# Patient Record
Sex: Female | Born: 1960 | Race: White | Hispanic: No | Marital: Married | Smoking: Former smoker
Health system: Southern US, Community
[De-identification: ages and names within clinical notes are randomized; demographics above are authoritative.]

## PROBLEM LIST (undated history)

## (undated) DIAGNOSIS — K7460 Unspecified cirrhosis of liver: Secondary | ICD-10-CM

## (undated) DIAGNOSIS — Z923 Personal history of irradiation: Secondary | ICD-10-CM

## (undated) DIAGNOSIS — N390 Urinary tract infection, site not specified: Secondary | ICD-10-CM

## (undated) DIAGNOSIS — F419 Anxiety disorder, unspecified: Secondary | ICD-10-CM

## (undated) DIAGNOSIS — K219 Gastro-esophageal reflux disease without esophagitis: Secondary | ICD-10-CM

## (undated) DIAGNOSIS — M1611 Unilateral primary osteoarthritis, right hip: Secondary | ICD-10-CM

## (undated) DIAGNOSIS — F329 Major depressive disorder, single episode, unspecified: Secondary | ICD-10-CM

## (undated) DIAGNOSIS — F1020 Alcohol dependence, uncomplicated: Secondary | ICD-10-CM

## (undated) DIAGNOSIS — F17210 Nicotine dependence, cigarettes, uncomplicated: Secondary | ICD-10-CM

## (undated) DIAGNOSIS — M545 Low back pain, unspecified: Secondary | ICD-10-CM

## (undated) DIAGNOSIS — D649 Anemia, unspecified: Secondary | ICD-10-CM

## (undated) DIAGNOSIS — Z720 Tobacco use: Secondary | ICD-10-CM

## (undated) DIAGNOSIS — IMO0001 Reserved for inherently not codable concepts without codable children: Secondary | ICD-10-CM

## (undated) DIAGNOSIS — G9511 Acute infarction of spinal cord (embolic) (nonembolic): Secondary | ICD-10-CM

## (undated) DIAGNOSIS — M1990 Unspecified osteoarthritis, unspecified site: Secondary | ICD-10-CM

## (undated) DIAGNOSIS — J449 Chronic obstructive pulmonary disease, unspecified: Secondary | ICD-10-CM

## (undated) DIAGNOSIS — F32A Depression, unspecified: Secondary | ICD-10-CM

## (undated) DIAGNOSIS — R011 Cardiac murmur, unspecified: Secondary | ICD-10-CM

## (undated) DIAGNOSIS — I10 Essential (primary) hypertension: Secondary | ICD-10-CM

## (undated) HISTORY — PX: TUBAL LIGATION: SHX77

## (undated) HISTORY — PX: DILATION AND CURETTAGE OF UTERUS: SHX78

## (undated) HISTORY — DX: Unilateral primary osteoarthritis, right hip: M16.11

## (undated) HISTORY — PX: ESOPHAGOGASTRODUODENOSCOPY ENDOSCOPY: SHX5814

## (undated) HISTORY — DX: Tobacco use: Z72.0

## (undated) HISTORY — PX: COLONOSCOPY: SHX174

## (undated) HISTORY — DX: Unspecified cirrhosis of liver: K74.60

## (undated) HISTORY — DX: Acute infarction of spinal cord (embolic) (nonembolic): G95.11

---

## 2005-08-01 ENCOUNTER — Emergency Department: Payer: Self-pay | Admitting: Emergency Medicine

## 2007-11-03 ENCOUNTER — Emergency Department: Payer: Self-pay | Admitting: Emergency Medicine

## 2007-12-01 ENCOUNTER — Ambulatory Visit: Payer: Self-pay

## 2008-11-28 ENCOUNTER — Ambulatory Visit: Payer: Self-pay

## 2008-12-06 ENCOUNTER — Inpatient Hospital Stay: Payer: Self-pay | Admitting: Family

## 2008-12-06 ENCOUNTER — Ambulatory Visit: Payer: Self-pay | Admitting: Family

## 2008-12-19 ENCOUNTER — Ambulatory Visit: Payer: Self-pay | Admitting: Family

## 2008-12-22 ENCOUNTER — Other Ambulatory Visit: Payer: Self-pay | Admitting: Family

## 2009-05-04 ENCOUNTER — Inpatient Hospital Stay: Payer: Self-pay | Admitting: Internal Medicine

## 2009-07-30 ENCOUNTER — Ambulatory Visit: Payer: Self-pay | Admitting: Gastroenterology

## 2010-01-11 ENCOUNTER — Ambulatory Visit: Payer: Self-pay | Admitting: Internal Medicine

## 2010-01-29 ENCOUNTER — Inpatient Hospital Stay: Payer: Self-pay | Admitting: Internal Medicine

## 2010-02-11 ENCOUNTER — Ambulatory Visit: Payer: Self-pay | Admitting: Internal Medicine

## 2010-02-11 ENCOUNTER — Emergency Department: Payer: Self-pay | Admitting: Emergency Medicine

## 2010-02-14 ENCOUNTER — Ambulatory Visit: Payer: Self-pay | Admitting: Gastroenterology

## 2010-02-20 ENCOUNTER — Ambulatory Visit: Payer: Self-pay | Admitting: Family Medicine

## 2010-06-20 ENCOUNTER — Ambulatory Visit: Payer: Self-pay | Admitting: Gastroenterology

## 2011-04-09 ENCOUNTER — Ambulatory Visit: Payer: Self-pay | Admitting: Gastroenterology

## 2011-06-25 ENCOUNTER — Ambulatory Visit: Payer: Self-pay | Admitting: Family Medicine

## 2011-11-05 ENCOUNTER — Other Ambulatory Visit: Payer: Self-pay | Admitting: Family

## 2012-03-24 ENCOUNTER — Ambulatory Visit: Payer: Self-pay | Admitting: Gastroenterology

## 2012-08-17 ENCOUNTER — Ambulatory Visit: Payer: Self-pay | Admitting: Family Medicine

## 2013-01-03 ENCOUNTER — Ambulatory Visit: Payer: Self-pay | Admitting: Family Medicine

## 2013-08-29 ENCOUNTER — Ambulatory Visit: Payer: Self-pay | Admitting: Gastroenterology

## 2013-11-07 ENCOUNTER — Ambulatory Visit: Payer: Self-pay | Admitting: Family Medicine

## 2013-11-10 ENCOUNTER — Ambulatory Visit: Payer: Self-pay | Admitting: Family Medicine

## 2014-05-17 ENCOUNTER — Emergency Department: Payer: Self-pay | Admitting: Emergency Medicine

## 2014-05-17 LAB — BASIC METABOLIC PANEL
Anion Gap: 6 — ABNORMAL LOW (ref 7–16)
BUN: 15 mg/dL (ref 7–18)
CALCIUM: 8.6 mg/dL (ref 8.5–10.1)
Chloride: 104 mmol/L (ref 98–107)
Co2: 27 mmol/L (ref 21–32)
Creatinine: 0.89 mg/dL (ref 0.60–1.30)
EGFR (Non-African Amer.): >60
Glucose: 111 mg/dL — ABNORMAL HIGH (ref 65–99)
Osmolality: 275 (ref 275–301)
Potassium: 4 mmol/L (ref 3.5–5.1)
SODIUM: 137 mmol/L (ref 136–145)

## 2014-05-17 LAB — CBC
HCT: 44.3 % (ref 35.0–47.0)
HGB: 15 g/dL (ref 12.0–16.0)
MCH: 31.4 pg (ref 26.0–34.0)
MCHC: 33.8 g/dL (ref 32.0–36.0)
MCV: 93 fL (ref 80–100)
Platelet: 130 10*3/uL — ABNORMAL LOW (ref 150–440)
RBC: 4.77 10*6/uL (ref 3.80–5.20)
RDW: 12.8 % (ref 11.5–14.5)
WBC: 9.5 10*3/uL (ref 3.6–11.0)

## 2014-05-17 LAB — TROPONIN I: Troponin-I: <0.02 ng/mL

## 2014-05-19 ENCOUNTER — Telehealth: Payer: Self-pay

## 2014-05-19 NOTE — Telephone Encounter (Signed)
ATTEMPTED TO CALL PT TO R/S, NO ANSWER

## 2014-05-22 ENCOUNTER — Ambulatory Visit: Payer: Self-pay | Admitting: Cardiovascular Disease

## 2014-06-22 ENCOUNTER — Ambulatory Visit: Payer: Self-pay | Admitting: Cardiovascular Disease

## 2014-10-05 ENCOUNTER — Ambulatory Visit: Payer: Self-pay | Admitting: Gastroenterology

## 2014-11-15 ENCOUNTER — Other Ambulatory Visit: Payer: Self-pay

## 2014-11-15 DIAGNOSIS — Z1231 Encounter for screening mammogram for malignant neoplasm of breast: Secondary | ICD-10-CM

## 2014-12-06 ENCOUNTER — Ambulatory Visit: Payer: Self-pay

## 2014-12-15 ENCOUNTER — Ambulatory Visit
Admission: RE | Admit: 2014-12-15 | Discharge: 2014-12-15 | Disposition: A | Discharge status: Home / Self Care | Payer: Medicare Other | Source: Ambulatory Visit | Attending: Family Medicine | Admitting: Family Medicine

## 2014-12-15 DIAGNOSIS — Z1231 Encounter for screening mammogram for malignant neoplasm of breast: Secondary | ICD-10-CM | POA: Diagnosis not present

## 2015-09-04 ENCOUNTER — Other Ambulatory Visit: Payer: Self-pay | Admitting: Nurse Practitioner

## 2015-09-04 DIAGNOSIS — K7460 Unspecified cirrhosis of liver: Secondary | ICD-10-CM

## 2015-09-06 ENCOUNTER — Ambulatory Visit
Admission: RE | Admit: 2015-09-06 | Discharge: 2015-09-06 | Disposition: A | Discharge status: Home / Self Care | Payer: Medicare Other | Source: Ambulatory Visit | Attending: Nurse Practitioner | Admitting: Nurse Practitioner

## 2015-09-06 DIAGNOSIS — K7460 Unspecified cirrhosis of liver: Secondary | ICD-10-CM | POA: Insufficient documentation

## 2015-09-06 DIAGNOSIS — K8021 Calculus of gallbladder without cholecystitis with obstruction: Secondary | ICD-10-CM | POA: Diagnosis not present

## 2015-10-09 ENCOUNTER — Encounter
Admission: RE | Admit: 2015-10-09 | Discharge: 2015-10-09 | Disposition: A | Discharge status: Home / Self Care | Payer: Medicare Other | Source: Ambulatory Visit | Attending: Orthopedic Surgery | Admitting: Orthopedic Surgery

## 2015-10-09 DIAGNOSIS — I10 Essential (primary) hypertension: Secondary | ICD-10-CM | POA: Diagnosis not present

## 2015-10-09 DIAGNOSIS — M1611 Unilateral primary osteoarthritis, right hip: Secondary | ICD-10-CM | POA: Diagnosis not present

## 2015-10-09 DIAGNOSIS — Z01812 Encounter for preprocedural laboratory examination: Secondary | ICD-10-CM | POA: Insufficient documentation

## 2015-10-09 DIAGNOSIS — Z01810 Encounter for preprocedural cardiovascular examination: Secondary | ICD-10-CM | POA: Insufficient documentation

## 2015-10-09 HISTORY — DX: Unspecified cirrhosis of liver: K74.60

## 2015-10-09 HISTORY — DX: Chronic obstructive pulmonary disease, unspecified: J44.9

## 2015-10-09 HISTORY — DX: Reserved for inherently not codable concepts without codable children: IMO0001

## 2015-10-09 HISTORY — DX: Gastro-esophageal reflux disease without esophagitis: K21.9

## 2015-10-09 HISTORY — DX: Low back pain, unspecified: M54.50

## 2015-10-09 HISTORY — DX: Essential (primary) hypertension: I10

## 2015-10-09 HISTORY — DX: Low back pain: M54.5

## 2015-10-09 HISTORY — DX: Nicotine dependence, cigarettes, uncomplicated: F17.210

## 2015-10-09 HISTORY — DX: Depression, unspecified: F32.A

## 2015-10-09 HISTORY — DX: Unspecified osteoarthritis, unspecified site: M19.90

## 2015-10-09 HISTORY — DX: Major depressive disorder, single episode, unspecified: F32.9

## 2015-10-09 LAB — BASIC METABOLIC PANEL
Anion gap: 8 (ref 5–15)
BUN: 26 mg/dL — AB (ref 6–20)
CALCIUM: 9.4 mg/dL (ref 8.9–10.3)
CHLORIDE: 103 mmol/L (ref 101–111)
CO2: 23 mmol/L (ref 22–32)
CREATININE: 0.75 mg/dL (ref 0.44–1.00)
GFR calc Af Amer: >60 mL/min (ref >60)
Glucose, Bld: 92 mg/dL (ref 65–99)
Potassium: 4.7 mmol/L (ref 3.5–5.1)
SODIUM: 134 mmol/L — AB (ref 135–145)

## 2015-10-09 LAB — URINALYSIS COMPLETE WITH MICROSCOPIC (ARMC ONLY)
Bilirubin Urine: NEGATIVE
Glucose, UA: NEGATIVE mg/dL
KETONES UR: NEGATIVE mg/dL
NITRITE: POSITIVE — AB
Protein, ur: NEGATIVE mg/dL
SPECIFIC GRAVITY, URINE: 1.011 (ref 1.005–1.030)
pH: 6 (ref 5.0–8.0)

## 2015-10-09 LAB — SEDIMENTATION RATE: SED RATE: 21 mm/h (ref 0–30)

## 2015-10-09 LAB — PROTIME-INR
INR: 0.95
PROTHROMBIN TIME: 12.9 s (ref 11.4–15.0)

## 2015-10-09 LAB — APTT: aPTT: 32 seconds (ref 24–36)

## 2015-10-09 LAB — TYPE AND SCREEN
ABO/RH(D): O NEG
ANTIBODY SCREEN: NEGATIVE

## 2015-10-09 LAB — CBC
HCT: 41.3 % (ref 35.0–47.0)
Hemoglobin: 13.9 g/dL (ref 12.0–16.0)
MCH: 31.1 pg (ref 26.0–34.0)
MCHC: 33.8 g/dL (ref 32.0–36.0)
MCV: 91.9 fL (ref 80.0–100.0)
PLATELETS: 126 10*3/uL — AB (ref 150–440)
RBC: 4.49 MIL/uL (ref 3.80–5.20)
RDW: 12.6 % (ref 11.5–14.5)
WBC: 7.2 10*3/uL (ref 3.6–11.0)

## 2015-10-09 LAB — SURGICAL PCR SCREEN
MRSA, PCR: NEGATIVE
STAPHYLOCOCCUS AUREUS: NEGATIVE

## 2015-10-09 LAB — ABO/RH: ABO/RH(D): O NEG

## 2015-10-09 NOTE — Patient Instructions (Signed)
  Your procedure is scheduled on: October 16, 2015 (Tuesday) Report to Day Surgery.[facility]) Second Floor To find out your arrival time please call [phone] between 1PM - 3PM on October 15, 2015 (Monday).  Remember: Instructions that are not followed completely may result in serious medical risk, up to and including death, or upon the discretion of your surgeon and anesthesiologist your surgery may need to be rescheduled.    __x__ 1. Do not eat food or drink liquids after midnight. No gum chewing or hard candies.     ____ 2. No Alcohol for 24 hours before or after surgery.   ____ 3. Bring all medications with you on the day of surgery if instructed.    __x_ 4. Notify your doctor if there is any change in your medical condition     (cold, fever, infections).     Do not wear jewelry, make-up, hairpins, clips or nail polish.  Do not wear lotions, powders, or perfumes. You may wear deodorant.  Do not shave 48 hours prior to surgery. Men may shave face and neck.  Do not bring valuables to the hospital.    [facility] is not responsible for any belongings or valuables.               Contacts, dentures or bridgework may not be worn into surgery.  Leave your suitcase in the car. After surgery it may be brought to your room.  For patients admitted to the hospital, discharge time is determined by your                treatment team.   Patients discharged the day of surgery will not be allowed to drive home.   Please read over the following fact sheets that you were given:   MRSA Information and Surgical Site Infection Prevention   __x__ Take these medicines the morning of surgery with A SIP OF WATER:    1. Nadolol  2. Pantoprazole (Pantoprazole at bedtime on April 3)  3.   4.  5.  6.  ____ Fleet Enema (as directed)   _x__ Use CHG Soap as directed  _x___ Use inhalers on the day of surgery (Use Advair, Albuterol and Spiriva inhalers the morning of surgery , and bring Advair and  Albuterol inhalers with you to hospital)  ____ Stop metformin 2 days prior to surgery    ____ Take 1/2 of usual insulin dose the night before surgery and none on the morning of surgery.   _x___ Stop Coumadin/Plavix/aspirin on (N/A)  _x___ Stop Anti-inflammatories on (NO NSAIDS) (Stop Meloxicam now)   __x__ Stop supplements until after surgery.  (Stop Vitamin B complex now)  ____ Bring C-Pap to the hospital.

## 2015-10-10 NOTE — Pre-Procedure Instructions (Signed)
Abnormal UA results sent to Dr Menz.  

## 2015-10-11 LAB — URINE CULTURE: Culture: >=100000

## 2015-10-11 NOTE — Pre-Procedure Instructions (Signed)
Dr. Rosita KeaMenz office notified of urine culture with E coli sensitive to Ancef.  Surgery on 4/4 for total hip.  Does he want to treat before the surgery day.  Hope said she would check and call us back

## 2015-10-13 HISTORY — PX: JOINT REPLACEMENT: SHX530

## 2015-10-16 ENCOUNTER — Encounter
Admission: RE | Disposition: A | Discharge status: Home Health | Payer: Self-pay | Source: Ambulatory Visit | Attending: Orthopedic Surgery

## 2015-10-16 ENCOUNTER — Inpatient Hospital Stay: Payer: Medicare Other | Admitting: Anesthesiology

## 2015-10-16 ENCOUNTER — Inpatient Hospital Stay: Payer: Medicare Other

## 2015-10-16 ENCOUNTER — Inpatient Hospital Stay
Admission: RE | Admit: 2015-10-16 | Discharge: 2015-10-19 | DRG: 470 | Disposition: A | Discharge status: Home Health | Payer: Medicare Other | Source: Ambulatory Visit | Attending: Orthopedic Surgery | Admitting: Orthopedic Surgery

## 2015-10-16 ENCOUNTER — Encounter: Payer: Self-pay | Admitting: *Deleted

## 2015-10-16 DIAGNOSIS — Z885 Allergy status to narcotic agent status: Secondary | ICD-10-CM | POA: Diagnosis not present

## 2015-10-16 DIAGNOSIS — I10 Essential (primary) hypertension: Secondary | ICD-10-CM | POA: Diagnosis present

## 2015-10-16 DIAGNOSIS — G8918 Other acute postprocedural pain: Secondary | ICD-10-CM

## 2015-10-16 DIAGNOSIS — F329 Major depressive disorder, single episode, unspecified: Secondary | ICD-10-CM | POA: Diagnosis present

## 2015-10-16 DIAGNOSIS — K7030 Alcoholic cirrhosis of liver without ascites: Secondary | ICD-10-CM | POA: Diagnosis present

## 2015-10-16 DIAGNOSIS — Z7951 Long term (current) use of inhaled steroids: Secondary | ICD-10-CM | POA: Diagnosis not present

## 2015-10-16 DIAGNOSIS — J449 Chronic obstructive pulmonary disease, unspecified: Secondary | ICD-10-CM | POA: Diagnosis present

## 2015-10-16 DIAGNOSIS — Z888 Allergy status to other drugs, medicaments and biological substances status: Secondary | ICD-10-CM | POA: Diagnosis not present

## 2015-10-16 DIAGNOSIS — D62 Acute posthemorrhagic anemia: Secondary | ICD-10-CM | POA: Diagnosis not present

## 2015-10-16 DIAGNOSIS — Z79899 Other long term (current) drug therapy: Secondary | ICD-10-CM | POA: Diagnosis not present

## 2015-10-16 DIAGNOSIS — Z419 Encounter for procedure for purposes other than remedying health state, unspecified: Secondary | ICD-10-CM

## 2015-10-16 DIAGNOSIS — M1611 Unilateral primary osteoarthritis, right hip: Secondary | ICD-10-CM | POA: Diagnosis present

## 2015-10-16 DIAGNOSIS — K219 Gastro-esophageal reflux disease without esophagitis: Secondary | ICD-10-CM | POA: Diagnosis present

## 2015-10-16 DIAGNOSIS — F17210 Nicotine dependence, cigarettes, uncomplicated: Secondary | ICD-10-CM | POA: Diagnosis present

## 2015-10-16 HISTORY — PX: TOTAL HIP ARTHROPLASTY: SHX124

## 2015-10-16 HISTORY — DX: Unilateral primary osteoarthritis, right hip: M16.11

## 2015-10-16 LAB — CBC
HEMATOCRIT: 34.8 % — AB (ref 35.0–47.0)
Hemoglobin: 11.8 g/dL — ABNORMAL LOW (ref 12.0–16.0)
MCH: 31.2 pg (ref 26.0–34.0)
MCHC: 33.7 g/dL (ref 32.0–36.0)
MCV: 92.5 fL (ref 80.0–100.0)
PLATELETS: 109 10*3/uL — AB (ref 150–440)
RBC: 3.77 MIL/uL — ABNORMAL LOW (ref 3.80–5.20)
RDW: 12.4 % (ref 11.5–14.5)
WBC: 12.5 10*3/uL — AB (ref 3.6–11.0)

## 2015-10-16 LAB — CREATININE, SERUM
Creatinine, Ser: 0.7 mg/dL (ref 0.44–1.00)
GFR calc Af Amer: >60 mL/min (ref >60)

## 2015-10-16 SURGERY — ARTHROPLASTY, HIP, TOTAL, ANTERIOR APPROACH
Anesthesia: General | Site: Hip | Laterality: Right | Wound class: Clean

## 2015-10-16 MED ORDER — METOCLOPRAMIDE HCL 5 MG/ML IJ SOLN: 5.0000 mg | Freq: Three times a day (TID) | INTRAMUSCULAR | Status: DC | PRN

## 2015-10-16 MED ORDER — METHOCARBAMOL 1000 MG/10ML IJ SOLN
500.0000 mg | Freq: Four times a day (QID) | INTRAVENOUS | Status: DC | PRN
  Filled 2015-10-16: qty 5

## 2015-10-16 MED ORDER — PROPOFOL 10 MG/ML IV BOLUS
INTRAVENOUS | Status: DC | PRN
  Administered 2015-10-16: 50 mg via INTRAVENOUS

## 2015-10-16 MED ORDER — ENOXAPARIN SODIUM 40 MG/0.4ML ~~LOC~~ SOLN
40.0000 mg | SUBCUTANEOUS | Status: DC
  Administered 2015-10-17 – 2015-10-19 (×3): 40 mg via SUBCUTANEOUS
  Filled 2015-10-16 (×4): qty 0.4

## 2015-10-16 MED ORDER — CEFAZOLIN SODIUM-DEXTROSE 2-4 GM/100ML-% IV SOLN
2.0000 g | Freq: Once | INTRAVENOUS | Status: AC
  Administered 2015-10-16: 2 g via INTRAVENOUS

## 2015-10-16 MED ORDER — RENA-VITE PO TABS
1.0000 | ORAL_TABLET | Freq: Every day | ORAL | Status: DC
  Administered 2015-10-17 – 2015-10-19 (×3): 1 via ORAL
  Filled 2015-10-16 (×5): qty 1

## 2015-10-16 MED ORDER — FLUTICASONE FUROATE-VILANTEROL 200-25 MCG/INH IN AEPB
1.0000 | INHALATION_SPRAY | Freq: Every day | RESPIRATORY_TRACT | Status: DC
  Administered 2015-10-17 – 2015-10-19 (×3): 1 via RESPIRATORY_TRACT
  Filled 2015-10-16: qty 28

## 2015-10-16 MED ORDER — PROPOFOL 500 MG/50ML IV EMUL
INTRAVENOUS | Status: DC | PRN
  Administered 2015-10-16: 100 ug/kg/min via INTRAVENOUS

## 2015-10-16 MED ORDER — NEOMYCIN-POLYMYXIN B GU 40-200000 IR SOLN
Status: AC
  Filled 2015-10-16: qty 4

## 2015-10-16 MED ORDER — MENTHOL 3 MG MT LOZG: 1.0000 | LOZENGE | OROMUCOSAL | Status: DC | PRN

## 2015-10-16 MED ORDER — ACETAMINOPHEN 650 MG RE SUPP
650.0000 mg | Freq: Four times a day (QID) | RECTAL | Status: DC | PRN
Start: 2015-10-16 — End: 2015-10-19

## 2015-10-16 MED ORDER — METHOCARBAMOL 500 MG PO TABS
500.0000 mg | ORAL_TABLET | Freq: Four times a day (QID) | ORAL | Status: DC | PRN
  Administered 2015-10-16: 500 mg via ORAL
  Filled 2015-10-16: qty 1

## 2015-10-16 MED ORDER — CEFAZOLIN SODIUM-DEXTROSE 2-4 GM/100ML-% IV SOLN
2.0000 g | Freq: Four times a day (QID) | INTRAVENOUS | Status: AC
  Administered 2015-10-16 – 2015-10-17 (×3): 2 g via INTRAVENOUS
  Filled 2015-10-16 (×3): qty 100

## 2015-10-16 MED ORDER — PANTOPRAZOLE SODIUM 40 MG PO TBEC
40.0000 mg | DELAYED_RELEASE_TABLET | Freq: Every day | ORAL | Status: DC
  Administered 2015-10-17 – 2015-10-19 (×3): 40 mg via ORAL
  Filled 2015-10-16 (×3): qty 1

## 2015-10-16 MED ORDER — SPIRONOLACTONE 25 MG PO TABS
50.0000 mg | ORAL_TABLET | Freq: Every day | ORAL | Status: DC
  Administered 2015-10-17 – 2015-10-19 (×3): 50 mg via ORAL
  Filled 2015-10-16 (×3): qty 2

## 2015-10-16 MED ORDER — BUPIVACAINE-EPINEPHRINE (PF) 0.25% -1:200000 IJ SOLN
INTRAMUSCULAR | Status: AC
  Filled 2015-10-16: qty 30

## 2015-10-16 MED ORDER — METOCLOPRAMIDE HCL 10 MG PO TABS: 5.0000 mg | ORAL_TABLET | Freq: Three times a day (TID) | ORAL | Status: DC | PRN

## 2015-10-16 MED ORDER — ACETAMINOPHEN 325 MG PO TABS
650.0000 mg | ORAL_TABLET | Freq: Four times a day (QID) | ORAL | Status: DC | PRN
Start: 2015-10-16 — End: 2015-10-19

## 2015-10-16 MED ORDER — CEFAZOLIN SODIUM-DEXTROSE 2-4 GM/100ML-% IV SOLN
INTRAVENOUS | Status: AC
  Filled 2015-10-16: qty 100

## 2015-10-16 MED ORDER — MIDAZOLAM HCL 5 MG/5ML IJ SOLN
INTRAMUSCULAR | Status: DC | PRN
  Administered 2015-10-16 (×2): 1 mg via INTRAVENOUS

## 2015-10-16 MED ORDER — ONDANSETRON HCL 4 MG PO TABS: 4.0000 mg | ORAL_TABLET | Freq: Four times a day (QID) | ORAL | Status: DC | PRN

## 2015-10-16 MED ORDER — OXYCODONE HCL 5 MG PO TABS
5.0000 mg | ORAL_TABLET | ORAL | Status: DC | PRN
  Administered 2015-10-16: 10 mg via ORAL
  Administered 2015-10-16 (×2): 5 mg via ORAL
  Administered 2015-10-17 – 2015-10-19 (×8): 10 mg via ORAL
  Filled 2015-10-16: qty 2
  Filled 2015-10-16: qty 1
  Filled 2015-10-16 (×5): qty 2
  Filled 2015-10-16: qty 1
  Filled 2015-10-16 (×3): qty 2

## 2015-10-16 MED ORDER — FENTANYL CITRATE (PF) 100 MCG/2ML IJ SOLN: 25.0000 ug | INTRAMUSCULAR | Status: DC | PRN

## 2015-10-16 MED ORDER — MAGNESIUM CITRATE PO SOLN
1.0000 | Freq: Once | ORAL | Status: DC | PRN
  Filled 2015-10-16: qty 296

## 2015-10-16 MED ORDER — VITAMIN D 1000 UNITS PO TABS
5000.0000 [IU] | ORAL_TABLET | Freq: Every day | ORAL | Status: DC
  Administered 2015-10-17 – 2015-10-19 (×3): 5000 [IU] via ORAL
  Filled 2015-10-16 (×3): qty 5

## 2015-10-16 MED ORDER — PHENOL 1.4 % MT LIQD: 1.0000 | OROMUCOSAL | Status: DC | PRN

## 2015-10-16 MED ORDER — MAGNESIUM HYDROXIDE 400 MG/5ML PO SUSP
30.0000 mL | Freq: Every day | ORAL | Status: DC | PRN
  Administered 2015-10-17: 30 mL via ORAL
  Filled 2015-10-16: qty 30

## 2015-10-16 MED ORDER — NADOLOL 20 MG PO TABS
40.0000 mg | ORAL_TABLET | Freq: Every day | ORAL | Status: DC
  Administered 2015-10-17 – 2015-10-19 (×3): 40 mg via ORAL
  Filled 2015-10-16 (×4): qty 2

## 2015-10-16 MED ORDER — ALBUTEROL SULFATE (2.5 MG/3ML) 0.083% IN NEBU: 2.5000 mg | INHALATION_SOLUTION | RESPIRATORY_TRACT | Status: DC | PRN

## 2015-10-16 MED ORDER — MORPHINE SULFATE (PF) 2 MG/ML IV SOLN
2.0000 mg | INTRAVENOUS | Status: DC | PRN
  Administered 2015-10-16 (×2): 2 mg via INTRAVENOUS
  Filled 2015-10-16 (×2): qty 1

## 2015-10-16 MED ORDER — ONDANSETRON HCL 4 MG/2ML IJ SOLN: 4.0000 mg | Freq: Four times a day (QID) | INTRAMUSCULAR | Status: DC | PRN

## 2015-10-16 MED ORDER — BISACODYL 10 MG RE SUPP
10.0000 mg | Freq: Every day | RECTAL | Status: DC | PRN
Start: 2015-10-16 — End: 2015-10-19
  Administered 2015-10-18: 10 mg via RECTAL
  Filled 2015-10-16: qty 1

## 2015-10-16 MED ORDER — SODIUM CHLORIDE 0.9 % IV SOLN
INTRAVENOUS | Status: DC
  Administered 2015-10-16 – 2015-10-17 (×2): via INTRAVENOUS

## 2015-10-16 MED ORDER — DOCUSATE SODIUM 100 MG PO CAPS
100.0000 mg | ORAL_CAPSULE | Freq: Two times a day (BID) | ORAL | Status: DC
  Administered 2015-10-16 – 2015-10-19 (×7): 100 mg via ORAL
  Filled 2015-10-16 (×7): qty 1

## 2015-10-16 MED ORDER — NEOMYCIN-POLYMYXIN B GU 40-200000 IR SOLN
Status: DC | PRN
Start: 2015-10-16 — End: 2015-10-16
  Administered 2015-10-16: 4 mL

## 2015-10-16 MED ORDER — BUPIVACAINE-EPINEPHRINE 0.25% -1:200000 IJ SOLN
INTRAMUSCULAR | Status: DC | PRN
  Administered 2015-10-16: 30 mL

## 2015-10-16 MED ORDER — FENTANYL CITRATE (PF) 100 MCG/2ML IJ SOLN
INTRAMUSCULAR | Status: DC | PRN
  Administered 2015-10-16: 25 ug via INTRAVENOUS

## 2015-10-16 MED ORDER — DIPHENHYDRAMINE HCL 12.5 MG/5ML PO ELIX
12.5000 mg | ORAL_SOLUTION | ORAL | Status: DC | PRN
Start: 2015-10-16 — End: 2015-10-19

## 2015-10-16 MED ORDER — ONDANSETRON HCL 4 MG/2ML IJ SOLN: 4.0000 mg | Freq: Once | INTRAMUSCULAR | Status: DC | PRN

## 2015-10-16 MED ORDER — TIOTROPIUM BROMIDE MONOHYDRATE 18 MCG IN CAPS
18.0000 ug | ORAL_CAPSULE | Freq: Every day | RESPIRATORY_TRACT | Status: DC
  Administered 2015-10-17 – 2015-10-19 (×3): 18 ug via RESPIRATORY_TRACT
  Filled 2015-10-16: qty 5

## 2015-10-16 MED ORDER — ALBUTEROL SULFATE HFA 108 (90 BASE) MCG/ACT IN AERS: 2.0000 | INHALATION_SPRAY | RESPIRATORY_TRACT | Status: DC | PRN

## 2015-10-16 MED ORDER — LACTATED RINGERS IV SOLN
INTRAVENOUS | Status: DC
  Administered 2015-10-16 (×3): via INTRAVENOUS

## 2015-10-16 MED ORDER — PHENYLEPHRINE HCL 10 MG/ML IJ SOLN
INTRAMUSCULAR | Status: DC | PRN
  Administered 2015-10-16 (×2): 100 ug via INTRAVENOUS

## 2015-10-16 SURGICAL SUPPLY — 44 items
BLADE SAW SAG 18.5X105 (BLADE) ×2 IMPLANT
BNDG COHESIVE 6X5 TAN STRL LF (GAUZE/BANDAGES/DRESSINGS) ×4 IMPLANT
CANISTER SUCT 1200ML W/VALVE (MISCELLANEOUS) ×2 IMPLANT
CAPT HIP TOTAL 3 ×2 IMPLANT
CATH FOL LEG HOLDER (MISCELLANEOUS) ×2 IMPLANT
CATH TRAY METER 16FR LF (MISCELLANEOUS) ×2 IMPLANT
CHLORAPREP W/TINT 26ML (MISCELLANEOUS) ×2 IMPLANT
DRAPE C-ARM XRAY 36X54 (DRAPES) ×2 IMPLANT
DRAPE INCISE IOBAN 66X60 STRL (DRAPES) IMPLANT
DRAPE POUCH INSTRU U-SHP 10X18 (DRAPES) ×2 IMPLANT
DRAPE SHEET LG 3/4 BI-LAMINATE (DRAPES) ×6 IMPLANT
DRAPE STERI IOBAN 125X83 (DRAPES) ×2 IMPLANT
DRAPE TABLE BACK 80X90 (DRAPES) ×2 IMPLANT
DRSG OPSITE POSTOP 4X8 (GAUZE/BANDAGES/DRESSINGS) ×4 IMPLANT
ELECT BLADE 6.5 EXT (BLADE) ×2 IMPLANT
GAUZE SPONGE 4X4 12PLY STRL (GAUZE/BANDAGES/DRESSINGS) ×2 IMPLANT
GLOVE BIOGEL PI IND STRL 9 (GLOVE) ×1 IMPLANT
GLOVE BIOGEL PI INDICATOR 9 (GLOVE) ×1
GLOVE SURG ORTHO 9.0 STRL STRW (GLOVE) ×4 IMPLANT
GOWN STRL REUS W/ TWL LRG LVL3 (GOWN DISPOSABLE) ×1 IMPLANT
GOWN STRL REUS W/TWL LRG LVL3 (GOWN DISPOSABLE) ×1
GOWN SURG XXL (GOWNS) ×2 IMPLANT
HEMOVAC 400CC 10FR (MISCELLANEOUS) ×2 IMPLANT
HOOD PEEL AWAY FLYTE STAYCOOL (MISCELLANEOUS) ×2 IMPLANT
MAT BLUE FLOOR 46X72 FLO (MISCELLANEOUS) ×2 IMPLANT
NDL SAFETY 18GX1.5 (NEEDLE) ×2 IMPLANT
NEEDLE SPNL 18GX3.5 QUINCKE PK (NEEDLE) ×2 IMPLANT
NS IRRIG 1000ML POUR BTL (IV SOLUTION) ×2 IMPLANT
PACK HIP COMPR (MISCELLANEOUS) ×2 IMPLANT
SOL PREP PVP 2OZ (MISCELLANEOUS) ×2
SOLUTION PREP PVP 2OZ (MISCELLANEOUS) ×1 IMPLANT
STAPLER SKIN PROX 35W (STAPLE) ×2 IMPLANT
STRAP SAFETY BODY (MISCELLANEOUS) ×2 IMPLANT
SUT DVC 2 QUILL PDO  T11 36X36 (SUTURE) ×1
SUT DVC 2 QUILL PDO T11 36X36 (SUTURE) ×1 IMPLANT
SUT DVC QUILL MONODERM 30X30 (SUTURE) ×2 IMPLANT
SUT SILK 0 (SUTURE) ×1
SUT SILK 0 30XBRD TIE 6 (SUTURE) ×1 IMPLANT
SUT VIC AB 1 CT1 36 (SUTURE) ×2 IMPLANT
SYR 20CC LL (SYRINGE) ×2 IMPLANT
SYR 30ML LL (SYRINGE) ×2 IMPLANT
TAPE MICROFOAM 4IN (TAPE) ×2 IMPLANT
TOWEL OR 17X26 4PK STRL BLUE (TOWEL DISPOSABLE) ×2 IMPLANT
TUBE KAMVAC SUCTION (TUBING) ×2 IMPLANT

## 2015-10-16 NOTE — Anesthesia Preprocedure Evaluation (Addendum)
Anesthesia Evaluation  Patient identified by MRN, date of birth, ID band Patient awake    Reviewed: Allergy & Precautions, H&P , NPO status , Patient's Chart, lab work & pertinent test results, reviewed documented beta blocker date and time   Airway Mallampati: IV   Neck ROM: full    Dental  (+) Poor Dentition, Teeth Intact, Partial Upper, Partial Lower   Pulmonary neg pulmonary ROS, shortness of breath, COPD,  COPD inhaler, Current Smoker,    Pulmonary exam normal        Cardiovascular Exercise Tolerance: Poor hypertension, negative cardio ROS Normal cardiovascular exam     Neuro/Psych PSYCHIATRIC DISORDERS negative neurological ROS  negative psych ROS   GI/Hepatic negative GI ROS, Neg liver ROS, GERD  ,  Endo/Other  negative endocrine ROS  Renal/GU negative Renal ROS  negative genitourinary   Musculoskeletal   Abdominal   Peds  Hematology negative hematology ROS (+)   Anesthesia Other Findings Past Medical History:   Hypertension                                                 COPD (chronic obstructive pulmonary disease) (*              Shortness of breath dyspnea                                    Comment:with exertion   Depression                                                   GERD (gastroesophageal reflux disease)                       Arthritis                                                    Cirrhosis of liver (HCC)                                     Spine pain, lumbar                                           Cigarette smoker                                           Past Surgical History:   TUBAL LIGATION                                                CESAREAN SECTION  DILATION AND CURETTAGE OF UTERUS                              COLONOSCOPY                                                   ESOPHAGOGASTRODUODENOSCOPY ENDOSCOPY                          Reproductive/Obstetrics                             Anesthesia Physical Anesthesia Plan  ASA: III  Anesthesia Plan: General and Spinal   Post-op Pain Management:    Induction:   Airway Management Planned:   Additional Equipment:   Intra-op Plan:   Post-operative Plan:   Informed Consent: I have reviewed the patients History and Physical, chart, labs and discussed the procedure including the risks, benefits and alternatives for the proposed anesthesia with the patient or authorized representative who has indicated his/her understanding and acceptance.   Dental Advisory Given  Plan Discussed with: CRNA  Anesthesia Plan Comments:         Anesthesia Quick Evaluation

## 2015-10-16 NOTE — Anesthesia Procedure Notes (Signed)
Spinal  End time: 10/16/2015 7:33 AM Staffing Anesthesiologist: Yevette EdwardsADAMS, JAMES G Resident/CRNA: Omer JackWEATHERLY, Francisco Eyerly Performed by: resident/CRNA  Preanesthetic Checklist Completed: patient identified, site marked, surgical consent, pre-op evaluation, timeout performed, IV checked, risks and benefits discussed and monitors and equipment checked Spinal Block Patient position: sitting Prep: Betadine Patient monitoring: heart rate, continuous pulse ox and blood pressure Approach: midline Location: L4-5 Injection technique: single-shot Needle Needle type: Whitacre  Needle gauge: 24 G Needle length: 9 cm Assessment Sensory level: T4

## 2015-10-16 NOTE — Evaluation (Signed)
Physical Therapy Evaluation Patient Details Name: [name] MRN: [id number] DOB: 03-Oct-1960 Today's Date: 10/16/2015   History of Present Illness  Pt underwent R THR anterior approach without reported post-op complications. She is POD#0 at time of initial evaluation. No reported falls in the last 12 months. Pt denies history of prior joint replacement surgeries  Clinical Impression  Pt is a pleasant 55 yo female who underwent R THR. Pt reports some mild residual RLE numbness/weakness post-operatively but sensation and strength mostly intact. Pt requires minA+1 for bed mobility but CGA only for transfers and ambulation. She is able to perform limited ambulation from bed to chair. Pt reports moderate pain but tolerates it well and does not present a barrier for activity. Premedicated before evaluation. Plan is to discharge home with husband and HH PT. Pt will benefit from skilled PT services to address deficits in strength, balance, and mobility in order to return to full function at home.     Follow Up Recommendations Home health PT    Equipment Recommendations  None recommended by PT    Recommendations for Other Services       Precautions / Restrictions Precautions Precautions: Anterior Hip Precaution Booklet Issued: Yes (comment) Restrictions Weight Bearing Restrictions: Yes RLE Weight Bearing: Weight bearing as tolerated      Mobility  Bed Mobility Overal bed mobility: Needs Assistance Bed Mobility: Supine to Sit     Supine to sit: Min assist     General bed mobility comments: Pt requires minA+1 for LLE adduction during supine to sit. HOB elevated and use of bed rails. Good UE strength noted being able to push herself from R sidelying to sitting as well as scoot forward in bed  Transfers Overall transfer level: Needs assistance Equipment used: Rolling walker (2 wheeled) Transfers: Sit to/from Stand Sit to Stand: Min guard         General transfer comment: Pt  demonstrates mild decrease in weight shift to RLE during sit to stand transfer. Cues for safe hand placement on bed. Pt requires increased time due to pain and weakness but able to complete.  Ambulation/Gait Ambulation/Gait assistance: Min guard Ambulation Distance (Feet): 3 Feet Assistive device: Rolling walker (2 wheeled) Gait Pattern/deviations: Step-to pattern Gait velocity: Decreased Gait velocity interpretation: <1.8 ft/sec, indicative of risk for recurrent falls General Gait Details: Pt able to take small steps from bed to recliner. Education provided regarding proper sequencing. Pt demonstrates some RLE buckling but able to support herself with LLE and arms. Pt with decreased weight shifting to RLE during gait. Able to safely transfer from bed to recliner  Stairs            Wheelchair Mobility    Modified Rankin (Stroke Patients Only)       Balance Overall balance assessment: Needs assistance Sitting-balance support: No upper extremity supported Sitting balance-Leahy Scale: Good     Standing balance support: Bilateral upper extremity supported Standing balance-Leahy Scale: Poor Standing balance comment: Pt requires UE support to maintain standing balance on this date                             Pertinent Vitals/Pain Pain Assessment: 0-10 Pain Score: 5  Pain Location: R hip Pain Descriptors / Indicators: Aching Pain Intervention(s): Limited activity within patient's tolerance;Monitored during session;Premedicated before session    Home Living Family/patient expects to be discharged to:: Private residence Living Arrangements: Spouse/significant other Available Help at Discharge: Family Type  of Home: House Home Access: Stairs to enter Entrance Stairs-Rails: None (Can hold onto doorframe) Entrance Stairs-Number of Steps: 1 Home Layout: One level Home Equipment: Walker - 2 wheels;Walker - 4 wheels;Bedside commode;Shower seat;Wheelchair - manual (no  grab bars, no hospital bed)      Prior Function Level of Independence: Independent         Comments: Independent for ADLs/IADLs. Drives and full community ambulator. Pt reports extended time to perform activities due to hip pain     Hand Dominance   Dominant Hand: Right    Extremity/Trunk Assessment   Upper Extremity Assessment: Overall WFL for tasks assessed           Lower Extremity Assessment: RLE deficits/detail RLE Deficits / Details: LLE grossly WFL. Pt reports persistent mild RLE numbness/tingling s/p spinal. Pt requires assist for SLR but able to perform SAQ without assistance. Pt demonstrates mild increase in R DF weakness compared to LLE. With light touch sensation testing pt reports symmetrical sensation to RLE/LLE       Communication   Communication: No difficulties  Cognition Arousal/Alertness: Awake/alert Behavior During Therapy: WFL for tasks assessed/performed Overall Cognitive Status: Within Functional Limits for tasks assessed                      General Comments      Exercises Total Joint Exercises Ankle Circles/Pumps: Strengthening;Both;10 reps;Supine Quad Sets: Strengthening;Both;10 reps;Supine Gluteal Sets: Strengthening;Both;10 reps;Supine Towel Squeeze: Strengthening;Both;10 reps;Supine Short Arc Quad: Strengthening;Right;10 reps;Supine Heel Slides: Strengthening;Right;10 reps;Supine Hip ABduction/ADduction: Strengthening;Right;10 reps;Supine Straight Leg Raises: Strengthening;Right;10 reps;Supine      Assessment/Plan    PT Assessment Patient needs continued PT services  PT Diagnosis Abnormality of gait;Difficulty walking;Generalized weakness;Acute pain   PT Problem List Decreased range of motion;Decreased strength;Decreased activity tolerance;Decreased balance;Decreased mobility;Decreased knowledge of precautions;Obesity;Pain  PT Treatment Interventions DME instruction;Gait training;Stair training;Therapeutic  activities;Therapeutic exercise;Balance training;Neuromuscular re-education;Patient/family education;Manual techniques   PT Goals (Current goals can be found in the Care Plan section) Acute Rehab PT Goals Patient Stated Goal: Pt would like to be able to work in her garden come time to plant in May PT Goal Formulation: With patient Time For Goal Achievement: 10/30/15 Potential to Achieve Goals: Good    Frequency BID   Barriers to discharge        Co-evaluation               End of Session Equipment Utilized During Treatment: Gait belt Activity Tolerance: Patient tolerated treatment well Patient left: in chair;with call bell/phone within reach;with SCD's reapplied (towel rolls under heels, ice pack on hip) Nurse Communication: Mobility status         Time: 1557-1630 PT Time Calculation (min) (ACUTE ONLY): 33 min   Charges:   PT Evaluation $PT Eval Low Complexity: 1 Procedure PT Treatments $Therapeutic Exercise: 8-22 mins   PT G Codes:       Sharalyn InkJason D Denissa Cozart PT, DPT   [name] 10/16/2015, 5:00 PM

## 2015-10-16 NOTE — H&P (Signed)
Reviewed paper H+P, will be scanned into chart. No changes noted.  

## 2015-10-16 NOTE — NC FL2 (Signed)
[place] MEDICAID FL2 LEVEL OF CARE SCREENING TOOL     IDENTIFICATION  Patient Name: [name] Birthdate: 1960-09-14 Sex: female Admission Date (Current Location): 10/16/2015  [facility] and [place] Number:  [address]  ([id number]) Facility and Address:  [facility], [address], [place], [place] [address]      Provider Number: [id number]  Attending Physician Name and Address:  Kennedy Bucker, MD  Relative Name and Phone Number:       Current Level of Care: Hospital Recommended Level of Care: Skilled Nursing Facility Prior Approval Number:    Date Approved/Denied:   PASRR Number:  [id number])  Discharge Plan: SNF    Current Diagnoses: Patient Active Problem List   Diagnosis Date Noted  . Primary osteoarthritis of right hip 10/16/2015   Hypertension   Arthritis   Cirrhosis (CMS-HCC)   COPD (chronic obstructive pulmonary disease) , unspecified (CMS-HCC)      Orientation RESPIRATION BLADDER Height & Weight     Self, Time, Situation, Place  Normal Continent Weight:   Height:     BEHAVIORAL SYMPTOMS/MOOD NEUROLOGICAL BOWEL NUTRITION STATUS   (none )  (none ) Continent Diet (Diet: Clear Liquid )  AMBULATORY STATUS COMMUNICATION OF NEEDS Skin   Extensive Assist Verbally Surgical wounds (Incision: Right Hip. )                       Personal Care Assistance Level of Assistance  Bathing, Feeding, Dressing Bathing Assistance: Limited assistance Feeding assistance: Independent Dressing Assistance: Limited assistance     Functional Limitations Info  Sight, Hearing, Speech Sight Info: Adequate Hearing Info: Adequate Speech Info: Adequate    SPECIAL CARE FACTORS FREQUENCY  PT (By licensed PT), OT (By licensed OT)     PT Frequency:  (5) OT Frequency:  (5)            Contractures      Additional Factors Info  Code Status, Allergies Code Status Info:  (Not on File ) Allergies Info:  (Chantix, Codeine)            Current Medications (10/16/2015):  This is the current hospital active medication list Current Facility-Administered Medications  Medication Dose Route Frequency Provider Last Rate Last Dose  . 0.9 %  sodium chloride infusion   Intravenous Continuous Kennedy Bucker, MD      . acetaminophen (TYLENOL) tablet 650 mg  650 mg Oral Q6H PRN Kennedy Bucker, MD       Or  . acetaminophen (TYLENOL) suppository 650 mg  650 mg Rectal Q6H PRN Kennedy Bucker, MD      . albuterol (PROVENTIL) (2.5 MG/3ML) 0.083% nebulizer solution 2.5 mg  2.5 mg Nebulization Q4H PRN Kennedy Bucker, MD      . b complex-vitamin c-folic acid (NEPHRO-VITE) tablet 1 tablet  1 tablet Oral Daily Kennedy Bucker, MD      . bisacodyl (DULCOLAX) suppository 10 mg  10 mg Rectal Daily PRN Kennedy Bucker, MD      . ceFAZolin (ANCEF) 2-4 GM/100ML-% IVPB           . ceFAZolin (ANCEF) IVPB 2g/100 mL premix  2 g Intravenous Q6H Kennedy Bucker, MD      . cholecalciferol (VITAMIN D) tablet 5,000 Units  5,000 Units Oral Daily Kennedy Bucker, MD      . diphenhydrAMINE (BENADRYL) 12.5 MG/5ML elixir 12.5-25 mg  12.5-25 mg Oral Q4H PRN Kennedy Bucker, MD      . docusate sodium (COLACE) capsule 100 mg  100 mg Oral BID Kennedy BuckerMichael Menz, MD      . Melene Muller[START ON 10/17/2015] enoxaparin (LOVENOX) injection 40 mg  40 mg Subcutaneous Q24H Kennedy BuckerMichael Menz, MD      . fluticasone furoate-vilanterol (BREO ELLIPTA) 200-25 MCG/INH 1 puff  1 puff Inhalation Daily Kennedy BuckerMichael Menz, MD      . magnesium citrate solution 1 Bottle  1 Bottle Oral Once PRN Kennedy BuckerMichael Menz, MD      . magnesium hydroxide (MILK OF MAGNESIA) suspension 30 mL  30 mL Oral Daily PRN Kennedy BuckerMichael Menz, MD      . menthol-cetylpyridinium (CEPACOL) lozenge 3 mg  1 lozenge Oral PRN Kennedy BuckerMichael Menz, MD       Or  . phenol (CHLORASEPTIC) mouth spray 1 spray  1 spray Mouth/Throat PRN Kennedy BuckerMichael Menz, MD      . methocarbamol (ROBAXIN) tablet 500 mg  500 mg Oral Q6H PRN Kennedy BuckerMichael Menz, MD       Or  . methocarbamol (ROBAXIN) 500 mg in dextrose 5 % 50 mL  IVPB  500 mg Intravenous Q6H PRN Kennedy BuckerMichael Menz, MD      . metoCLOPramide (REGLAN) tablet 5-10 mg  5-10 mg Oral Q8H PRN Kennedy BuckerMichael Menz, MD       Or  . metoCLOPramide (REGLAN) injection 5-10 mg  5-10 mg Intravenous Q8H PRN Kennedy BuckerMichael Menz, MD      . morphine 2 MG/ML injection 2 mg  2 mg Intravenous Q1H PRN Kennedy BuckerMichael Menz, MD      . nadolol (CORGARD) tablet 40 mg  40 mg Oral Daily Kennedy BuckerMichael Menz, MD      . ondansetron [facility](ZOFRAN) tablet 4 mg  4 mg Oral Q6H PRN Kennedy BuckerMichael Menz, MD       Or  . ondansetron [facility](ZOFRAN) injection 4 mg  4 mg Intravenous Q6H PRN Kennedy BuckerMichael Menz, MD      . oxyCODONE (Oxy IR/ROXICODONE) immediate release tablet 5-10 mg  5-10 mg Oral Q3H PRN Kennedy BuckerMichael Menz, MD      . pantoprazole (PROTONIX) EC tablet 40 mg  40 mg Oral Daily Kennedy BuckerMichael Menz, MD      . spironolactone (ALDACTONE) tablet 50 mg  50 mg Oral Daily Kennedy BuckerMichael Menz, MD      . tiotropium [facility](SPIRIVA) inhalation capsule 18 mcg  18 mcg Inhalation Daily Kennedy BuckerMichael Menz, MD         Discharge Medications: Please see discharge summary for a list of discharge medications.  Relevant Imaging Results:  Relevant Lab Results:   Additional Information  (SSN: [id number])  Haig ProphetMorgan, Siriah Treat G, LCSW

## 2015-10-16 NOTE — Transfer of Care (Signed)
Immediate Anesthesia Transfer of Care Note  Patient: Danielle RochesterJanet B [name]  Procedure(s) Performed: Procedure(s): TOTAL HIP ARTHROPLASTY ANTERIOR APPROACH (Right)  Patient Location: PACU  Anesthesia Type:Spinal  Level of Consciousness: awake, alert  and oriented  Airway & Oxygen Therapy: Patient Spontanous Breathing and Patient connected to nasal cannula oxygen  Post-op Assessment: Report given to RN and Post -op Vital signs reviewed and stable  Post vital signs: Reviewed and stable  Last Vitals:  Filed Vitals:   10/16/15 0612 10/16/15 0932  BP: 158/74 92/64  Pulse: 78 72  Temp: 36.7 C 36.2 C  Resp: 16 11    Complications: No apparent anesthesia complications

## 2015-10-16 NOTE — Op Note (Signed)
10/16/2015  9:29 AM  PATIENT:  Danielle RochesterJanet B Baxley  55 y.o. female  PRE-OPERATIVE DIAGNOSIS:  OSTEOARTHRITIS right hip  POST-OPERATIVE DIAGNOSIS:  OSTEOARTHRITIS right hip  PROCEDURE:  Procedure(s): TOTAL HIP ARTHROPLASTY ANTERIOR APPROACH (Right)  SURGEON: Leitha SchullerMichael J Akeema Broder, MD  ASSISTANTS: None  ANESTHESIA:   spinal  EBL:  Total I/O In: 1000 [I.V.:1000] Out: 450 [Urine:150; Blood:300]  BLOOD ADMINISTERED:none  DRAINS: (2) Hemovact drain(s) in the Subcutaneous layer with  Suction Open   LOCAL MEDICATIONS USED:  MARCAINE     SPECIMEN:  Source of Specimen:  Right femoral head  DISPOSITION OF SPECIMEN:  PATHOLOGY  COUNTS:  YES  TOURNIQUET:  * No tourniquets in log *  IMPLANTS: Medacta AMIS collared 3 standard stem with 48 mm Mpact cup DM liner and S 28 mm head  DICTATION: .Dragon Dictation   The patient was brought to the operating room and after spinal anesthesia was obtained patient was placed on the operative table with the ipsilateral foot into the Medacta attachment, contralateral leg on a well-padded table. C-arm was brought in and preop template x-ray taken. After prepping and draping in usual sterile fashion appropriate patient identification and timeout procedures were completed. Anterior approach to the hip was obtained and centered over the greater trochanter and TFL muscle. The subcutaneous tissue was incised hemostasis being achieved by electrocautery. TFL fascia was incised and the muscle retracted laterally deep retractor placed. The lateral femoral circumflex vessels were identified and ligated. The anterior capsule was exposed and a capsulotomy performed. The neck was identified and a femoral neck cut carried out with a saw. The head was removed without difficulty and showed sclerotic femoral head and acetabulum. Reaming was carried out to 48 mm and a 48 mm cup trial gave appropriate tightness to the acetabular component a 48 Mpact DM cup was impacted into position. The  leg was then externally rotated and ischiofemoral and pubofemoral releases carried out. The femur was sequentially broached to a size 3, size 3 stem with standard neck and S head trials were placed and the final components chosen. The 3 standard collared stem was inserted along with a S 28 mm head and 48 mm liner. The hip was reduced and was stable the wound was thoroughly irrigated. The deep fascia was closed using a heavy Quill after infiltration of 30 cc of quarter percent Sensorcaine with epinephrine. Subcutaneous drains were then inserted. 2-0 Quill to close the skin with skin staples. Xeroform and honeycomb dressing applied  PLAN OF CARE: Admit to inpatient

## 2015-10-17 ENCOUNTER — Encounter: Payer: Self-pay | Admitting: Orthopedic Surgery

## 2015-10-17 MED ORDER — FE FUMARATE-B12-VIT C-FA-IFC PO CAPS
1.0000 | ORAL_CAPSULE | Freq: Three times a day (TID) | ORAL | Status: DC
  Administered 2015-10-17 – 2015-10-19 (×7): 1 via ORAL
  Filled 2015-10-17 (×7): qty 1

## 2015-10-17 NOTE — Evaluation (Signed)
Occupational Therapy Evaluation Patient Details Name: [name] MRN: [id number] DOB: 04/10/1961 Today's Date: 10/17/2015    History of Present Illness Pt underwent R THR anterior approach without reported post-op complications. No reported falls in the last 12 months. Pt denies history of prior joint replacement surgeries.  She reports increased pain over the last few months which has affected her ability to complete daily tasks at home.     Clinical Impression   Patient is a 55 yo female admitted to [facility] for right hip THR with anterior approach.  She had increasing pain and decreased mobility in the last few months which also affected her ability to participate in self care and IADL tasks.  She presents with muscle weakness, acute pain, decreased knowledge of precautions and decreased ability to perform self care tasks and transfers/functional mobility.  She lives at home with her husband in a one story home and hopes to return there after discharge.  She would benefit from skilled OT to maximize her safety and independence in daily tasks to return home.    Follow Up Recommendations  Home health OT    Equipment Recommendations       Recommendations for Other Services       Precautions / Restrictions Precautions Precautions: Anterior Hip;Fall Precaution Booklet Issued: Yes (comment) Restrictions Weight Bearing Restrictions: Yes RLE Weight Bearing: Weight bearing as tolerated      Mobility Bed Mobility     Bed mobility per PT Supine to sit: Mod assist     General bed mobility comments: Pt able to perform bed mobility with use of railings and mod assist for trunk and R LE. Pt provided frequent cues regarding hand placement. Pt expressed feeling dizzy upon sitting up.    Transfers Overall transfer level: Needs assistance Equipment used: Rolling walker (2 wheeled) Transfers: Sit to/from Stand Sit to Stand: Min guard         General transfer comment: pain noted  during transfer and patient states, "I feel like I am scared to stand up straight because it feels like it is pulling.    Balance                                            ADL Overall ADL's : Needs assistance/impaired Eating/Feeding: Independent   Grooming: Standing;Min guard   Upper Body Bathing: Set up;Sitting   Lower Body Bathing: Moderate assistance;Set up   Upper Body Dressing : Set up   Lower Body Dressing: Set up;Moderate assistance   Toilet Transfer: Minimal assistance   Toileting- Clothing Manipulation and Hygiene: Min guard         General ADL Comments: Patient was just up with PT and ambulated to the [facility] and back, denies need to use the bathroom at this time.       Vision     Perception     Praxis      Pertinent Vitals/Pain Pain Assessment: 0-10 Pain Score: 6  Pain Location: r hip and low back Pain Descriptors / Indicators: Aching;Sore Pain Intervention(s): Limited activity within patient's tolerance;Monitored during session;Repositioned     Hand Dominance Right   Extremity/Trunk Assessment Upper Extremity Assessment Upper Extremity Assessment: Overall WFL for tasks assessed   Lower Extremity Assessment Lower Extremity Assessment: Defer to PT evaluation RLE Deficits / Details: BUE strength 3+/5 overall   Cervical / Trunk Assessment Cervical / Trunk  Assessment: Normal   Communication Communication Communication: No difficulties   Cognition Arousal/Alertness: Awake/alert Behavior During Therapy: WFL for tasks assessed/performed Overall Cognitive Status: Within Functional Limits for tasks assessed                     General Comments       Exercises Exercises: Total Joint;Other exercises   Shoulder Instructions      Home Living Family/patient expects to be discharged to:: Private residence Living Arrangements: Spouse/significant other Available Help at Discharge: Family Type of Home: House Home Access:  Stairs to enter Secretary/administratorntrance Stairs-Number of Steps: 1 Entrance Stairs-Rails: None Home Layout: One level     Bathroom Shower/Tub: Tub/shower unit Shower/tub characteristics: Engineer, building servicesCurtain Bathroom Toilet: Standard     Home Equipment: Environmental consultantWalker - 2 wheels;Walker - 4 wheels;Bedside commode;Shower seat;Wheelchair - manual          Prior Functioning/Environment Level of Independence: Independent        Comments: Independent for ADLs/IADLs. Drives and full community ambulator. Pt reports extended time to perform activities due to hip pain    OT Diagnosis: Generalized weakness;Other (comment);Acute pain (decreased ability to perform self care tasks.)   OT Problem List: Decreased strength;Impaired balance (sitting and/or standing);Decreased knowledge of precautions;Pain;Decreased activity tolerance;Decreased knowledge of use of DME or AE   OT Treatment/Interventions: Self-care/ADL training;Therapeutic exercise;Patient/family education;Balance training;Therapeutic activities;DME and/or AE instruction    OT Goals(Current goals can be found in the care plan section) Acute Rehab OT Goals Patient Stated Goal: Pt would like to be able to work in her garden come time to plant in May, 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 OT Goal Formulation: With patient Time For Goal Achievement: 11/03/15 Potential to Achieve Goals: Good  OT Frequency: Min 1X/week   Barriers to D/C:            Co-evaluation              End of Session Equipment Utilized During Treatment: Gait belt;Rolling walker  Activity Tolerance: Patient tolerated treatment well Patient left: in chair;with call bell/phone within reach   Time: 1047-1108 OT Time Calculation (min): 21 min Charges:  OT General Charges $OT Visit: 1 Procedure OT Evaluation $OT Eval Low Complexity: 1 Procedure G-Codes:    Amy T Lovett, OTR/L, CLT  Lovett,Amy 10/17/2015, 11:19 AM

## 2015-10-17 NOTE — Progress Notes (Signed)
Physical Therapy Treatment Patient Details Name: Danielle RochesterJanet B [name] MRN: [id number] DOB: 1960-10-19 Today's Date: 10/17/2015    History of Present Illness Pt underwent R THR anterior approach without reported post-op complications. No reported falls in the last 12 months. Pt denies history of prior joint replacement surgeries.  She reports increased pain over the last few months which has affected her ability to complete daily tasks at home.      PT Comments    Pt is progressing towards goals. Pt expressed feeling stiffer and in more pain than previous visit. Pt able to perform bed mobility with use of railings and mod assist for trunk and R LE. Pt able to perform sit to stand transfer using RW and mod assist. Pt required increased time during transfer for weight shift and weight-bearing on R LE. Pt able to ambulate in room approx. 20 ft. w/ RW and mod assist. Pt required assist for advancement of R LE during ambulation. Pt provided frequent cues on proper foot and walker placement. Pt performed supine there-ex x12 with mod to no assist. Pt remains motivated to participate in therapy. Pt demonstrates deficits in strength, ROM, balance and mobility. Pt would benefit from further skilled therapy to address deficits and return to PLOF; recommend pt sent to SNF after discharge from acute hospitalization.   Follow Up Recommendations  SNF     Equipment Recommendations  Rolling walker with 5" wheels    Recommendations for Other Services       Precautions / Restrictions Precautions Precautions: Anterior Hip;Fall Precaution Booklet Issued: Yes (comment) Restrictions Weight Bearing Restrictions: Yes RLE Weight Bearing: Weight bearing as tolerated    Mobility  Bed Mobility Overal bed mobility: Needs Assistance Bed Mobility: Supine to Sit     Supine to sit: Mod assist     General bed mobility comments: Pt able to perform bed mobility with use of railings and mod assist for trunk and R LE. Pt  provided frequent cues regarding hand placement. Pt expressed feeling dizzy upon sitting up.    Transfers Overall transfer level: Needs assistance Equipment used: Rolling walker (2 wheeled) Transfers: Sit to/from Stand Sit to Stand: Mod Assist          General transfer comment: Pt able to perform transfer from EOB with RW and mod assist. Pt needed increased time for weight shift and placement of R LE on ground. Pt provided cues regarding proper foot and hand placement.   Ambulation/Gait Ambulation/Gait assistance: Mod assist Ambulation Distance (Feet): 20 Feet Assistive device: Rolling walker (2 wheeled) Gait Pattern/deviations: Step-to pattern Gait velocity: Decreased   General Gait Details: Pt able to ambulate in room with RW and mod assist. Pt required heavy cues for proper foot and walker placement t/o ambulation. Pt instructed to not let R LE extend past area of walker. Pt demonstrated increased time to raise R LE off ground during swing phase and decreased toe off of R LE. Pt required assist w/advancing R LE forward. Pt expressed difficulty moving R LE due to weakness and stiffness.     Stairs            Wheelchair Mobility    Modified Rankin (Stroke Patients Only)       Balance                                    Cognition Arousal/Alertness: Awake/alert Behavior During Therapy: WFL for tasks assessed/performed  Overall Cognitive Status: Within Functional Limits for tasks assessed                      Exercises Other Exercises Other Exercises: Pt performed supine ther-ex on R LE including quad sets, glute sets, and SAQ w/ no assist, and hip ab/ad with mod assist. All ther-ex performed x12 reps.  Other Exercises: Pt needed assist getting on/off BSC including help w/positioning and cues regarding hand placement.     General Comments        Pertinent Vitals/Pain Pain Assessment: 0-10 Pain Score: 6  Pain Location: r hip and low  back Pain Descriptors / Indicators: Aching;Sore Pain Intervention(s): Limited activity within patient's tolerance;Monitored during session;Repositioned    Home Living Family/patient expects to be discharged to:: Private residence Living Arrangements: Spouse/significant other Available Help at Discharge: Family Type of Home: House Home Access: Stairs to enter Entrance Stairs-Rails: None Home Layout: One level Home Equipment: Environmental consultant - 2 wheels;Walker - 4 wheels;Bedside commode;Shower seat;Wheelchair - manual      Prior Function Level of Independence: Independent      Comments: Independent for ADLs/IADLs. Drives and full community ambulator. 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 PT Goal Formulation: With patient Time For Goal Achievement: 10/30/15 Potential to Achieve Goals: Good Progress towards PT goals: Progressing toward goals    Frequency  BID    PT Plan      Co-evaluation             End of Session Equipment Utilized During Treatment: Gait belt Activity Tolerance: Patient tolerated treatment well Patient left: in chair;Other (comment) (OT in room)     Time: 1016-1050 PT Time Calculation (min) (ACUTE ONLY): 34 min  Charges:  $Gait Training: 8-22 mins $Therapeutic Exercise: 8-22 mins                    G Codes:      Dorita Fray 2015/11/08, 12:55 PM M. Hettie Holstein, SPT

## 2015-10-17 NOTE — Care Management Note (Addendum)
Case Management Note  Patient Details  Name: [name] MRN: [id number] Date of Birth: [birth year]  Subjective/Objective:                   Met with patient to discuss discharge planning. She is refusing SNF. She states she would like to use [facility] for HHPT. She states that she has a supportive husband that can help her at home. She has a rollator, rolling walker, wheelchair, and requests a cane which has been delivered to this room by [facility]. She states she was sent home with [facility] several years ago but "quit drinking and doing much better- 7 years ago". She uses [facility] in [place] for Rx [phone] and gets assistance with costs.  Action/Plan: List of home health care agencies left with patient. Referral to [place] [facility]. [facility] 81m [facility] RNCM will continue to follow.   Expected Discharge Date:                  Expected Discharge Plan:     In-House Referral:     Discharge planning Services  CM Consult  Post Acute Care Choice:  Durable Medical Equipment, Home Health Choice offered to:  Patient  DME Arranged:  CKasandra KnudsenDME Agency:  [place] Springs[place]:  [facility]     Status of Service:  In process, will continue to follow  Medicare Important Message Given:    Date Medicare IM Given:    Medicare IM give by:    Date Additional Medicare IM Given:    Additional Medicare Important Message give by:     If discussed at LKiheiof Stay Meetings, dates discussed:    Additional Comments: Lovenox $3.30. Cane delivered.  AMarshell Garfinkel RN 10/17/2015, 1:38 PM

## 2015-10-17 NOTE — Progress Notes (Cosign Needed)
   Subjective: 1 Day Post-Op Procedure(s) (LRB): TOTAL HIP ARTHROPLASTY ANTERIOR APPROACH (Right) Patient reports pain as moderate.   Patient is well, and has had no acute complaints or problems Denies any CP, SOB, ABD pain. We will continue therapy today.  Plan is to go Home after hospital stay.  Objective: Vital signs in last 24 hours: Temp:  [97.1 F (36.2 C)-98.5 F (36.9 C)] 98.5 F (36.9 C) (04/05 0734) Pulse Rate:  [55-73] 73 (04/05 0734) Resp:  [11-20] 18 (04/05 0734) BP: (81-146)/(55-87) 128/87 mmHg (04/05 0734) SpO2:  [95 %-100 %] 95 % (04/05 0734) FiO2 (%):  [21 %-28 %] 21 % (04/04 1421) Weight:  [85.775 kg (189 lb 1.6 oz)] 85.775 kg (189 lb 1.6 oz) (04/04 1515)  Intake/Output from previous day: 04/04 0701 - 04/05 0700 In: 4308.3 [P.O.:840; I.V.:3168.3; IV Piggyback:300] Out: 4745 [Urine:4215; Drains:230; Blood:300] Intake/Output this shift:     Recent Labs  10/16/15 1317  HGB 11.8*    Recent Labs  10/16/15 1317  WBC 12.5*  RBC 3.77*  HCT 34.8*  PLT 109*    Recent Labs  10/16/15 1317  CREATININE 0.70   No results for input(s): LABPT, INR in the last 72 hours.  EXAM General - Patient is Alert, Appropriate and Oriented Extremity - Neurovascular intact Sensation intact distally Intact pulses distally Dorsiflexion/Plantar flexion intact Dressing - dressing C/D/I, no drainage and hemovac intact Motor Function - intact, moving foot and toes well on exam.   Past Medical History  Diagnosis Date  . Hypertension   . COPD (chronic obstructive pulmonary disease) (HCC)   . Shortness of breath dyspnea     with exertion  . Depression   . GERD (gastroesophageal reflux disease)   . Arthritis   . Cirrhosis of liver (HCC)   . Spine pain, lumbar   . Cigarette smoker     Assessment/Plan:   1 Day Post-Op Procedure(s) (LRB): TOTAL HIP ARTHROPLASTY ANTERIOR APPROACH (Right) Active Problems:   Primary osteoarthritis of right hip   Acute post op blood  loss anemia    Estimated body mass index is 33.51 kg/(m^2) as calculated from the following:   Height as of this encounter: 5\' 3"  (1.6 m).   Weight as of this encounter: 85.775 kg (189 lb 1.6 oz). Advance diet Up with therapy  Needs BM Recheck labs in the am  DVT Prophylaxis - Lovenox, Foot Pumps and TED hose Weight-Bearing as tolerated to right leg D/C O2 and Pulse OX and try on Room Air  T. Cranston Neighborhris Gaines, PA-C [facility] 10/17/2015, 8:02 AM

## 2015-10-17 NOTE — Clinical Social Work Note (Signed)
Clinical Social Work Assessment  Patient Details  Name: [name] MRN: [id number] Date of Birth: May 07, 1961  Date of referral:  10/17/15               Reason for consult:  Facility Placement                Permission sought to share information with:    Permission granted to share information::     Name::        Agency::     Relationship::     Contact Information:     Housing/Transportation Living arrangements for the past 2 months:  Single Family Home Source of Information:  Patient Patient Interpreter Needed:  None Criminal Activity/Legal Involvement Pertinent to Current Situation/Hospitalization:  No - Comment as needed Significant Relationships:    Lives with:  Adult Children, Spouse Do you feel safe going back to the place where you live?  Yes Need for family participation in patient care:  Yes (Comment)  Care giving concerns:  Patient lives in [place] with her husband Elberta Fortis.   Social Worker assessment / plan:  Holiday representative (CSW) received SNF consult. PT is recommending SNF today however patient may progress to home health. CSW met with patient alone at bedside. Patient was alert and oriented and was sitting up in the chair. CSW introduced self and explained role of CSW department. Patient reported that she lives in [place] with her husband Elberta Fortis and her 54 y.o son lives in [place] and goes to [employer]. Patient reported that her husband can provide 24/7 care. CSW explained that PT is recommending SNF. Patient declined SNF and reported that she is going home. Per patient she has a walker, Rollaider and wheelchair and requested a cane. RN Case Manager is aware of above. Please reconsult if future social work needs arise. CSW signing off.   Employment status:  Disabled (Comment on whether or not currently receiving Disability) Insurance information:  Medicare, Medicaid In [place] PT Recommendations:  [place] /  Referral to community resources:  Other (Comment Required) (Patient is refusing SNF and reported that she is going home. )  Patient/Family's Response to care:  Patient refused SNF and reported that she wants to go home.   Patient/Family's Understanding of and Emotional Response to Diagnosis, Current Treatment, and Prognosis:  Patient was pleasant and thanked CSW for visit.   Emotional Assessment Appearance:  Appears stated age Attitude/Demeanor/Rapport:    Affect (typically observed):  Accepting, Adaptable, Pleasant Orientation:  Oriented to Self, Oriented to Place, Oriented to  Time, Oriented to Situation Alcohol / Substance use:  Not Applicable Psych involvement (Current and /or in the community):  No (Comment)  Discharge Needs  Concerns to be addressed:  Discharge Planning Concerns Readmission within the last 30 days:  No Current discharge risk:  Dependent with Mobility Barriers to Discharge:  Continued Medical Work up   Loralyn Freshwater, LCSW 10/17/2015, 11:42 AM

## 2015-10-17 NOTE — Progress Notes (Signed)
Physical Therapy Treatment Patient Details Name: Danielle RochesterJanet B [name] MRN: [id number] DOB: 1960-07-23 Today's Date: 10/17/2015    History of Present Illness Pt underwent R THR anterior approach without reported post-op complications. No reported falls in the last 12 months. Pt denies history of prior joint replacement surgeries.  She reports increased pain over the last few months which has affected her ability to complete daily tasks at home.      PT Comments    Pt progressing towards goals. Pt able to perform bed mobility with mod assist and use of railings. Pt able to transfer from recliner using RW and min assist with increased time for weight shifting and weight-bearing on R LE. Pt able to ambulate using RW and mod assist in room approx. 40 ft. Pt received heavy cues regarding proper walker and foot placement and sequencing. Pt still requires assist with advancing R LE. Ambulation limited by pts fatigue/pain. Pt performed seated there-ex x12 reps with min to no assist. Pt demonstrates deficits in strength, balance, and mobility. Pt would benefit from skilled therapy to address deficits and return to PLOF; recommend pt sent to SNF after discharge from acute hospitalization.   Follow Up Recommendations  SNF     Equipment Recommendations  Rolling walker with 5" wheels    Recommendations for Other Services       Precautions / Restrictions Precautions Precautions: Anterior Hip;Fall Precaution Booklet Issued: Yes (comment) Restrictions Weight Bearing Restrictions: Yes RLE Weight Bearing: Weight bearing as tolerated    Mobility  Bed Mobility Overal bed mobility: Needs Assistance Bed Mobility: Sit to Supine     Supine to sit: Mod assist Sit to supine: Mod assist   General bed mobility comments: Pt able to perform bed mobility with use of railings and mod assist for trunk and R LE. Pt given frequent cues regarding hand placement.   Transfers Overall transfer level: Needs  assistance Equipment used: Rolling walker (2 wheeled) Transfers: Sit to/from Stand Sit to Stand: Min assist         General transfer comment: Pt able to transfer from sit to stand from recliner with RW and min assist. Pt provided heavy cues regarding hand and foot placement. Pt required increased time for weight shift and weight bearing on R LE.   Ambulation/Gait Ambulation/Gait assistance: Mod assist Ambulation Distance (Feet): 40 Feet Assistive device: Rolling walker (2 wheeled) Gait Pattern/deviations: Step-to pattern Gait velocity: Decreased   General Gait Details: Pt able to ambulate in room with RW and mod assist. Pt required heavy cues for proper foot and walker placement and correct sequencing t/o ambulation. Pt instructed to not let R LE extend past area of walker. Pt demonstrated increased time to raise R LE off ground during swing phase and decreased toe off of R LE. Pt required assist w/advancing R LE forward. Pt expressed difficulty moving R LE due to weakness and stiffness. Pt took 2 rest breaks during ambulation due to UE fatigue.     Stairs            Wheelchair Mobility    Modified Rankin (Stroke Patients Only)       Balance                                    Cognition Arousal/Alertness: Awake/alert Behavior During Therapy: WFL for tasks assessed/performed Overall Cognitive Status: Within Functional Limits for tasks assessed  Exercises Other Exercises Other Exercises: Pt performed sitting ther-ex on R LE including quad sets, glute sets, and SAQ w/o assist, and hip ab/ad and LAQ with min assist. All ther-ex performed x12 reps.  Other Exercises: Pt needed assist getting on/off BSC including help w/positioning and cues regarding hand placement.     General Comments        Pertinent Vitals/Pain Pain Assessment: Faces Pain Score: 6  Faces Pain Scale: Hurts even more Pain Location: R Hip Pain Descriptors /  Indicators: Aching Pain Intervention(s): Limited activity within patient's tolerance    Home Living Family/patient expects to be discharged to:: Private residence Living Arrangements: Spouse/significant other Available Help at Discharge: Family Type of Home: House Home Access: Stairs to enter Entrance Stairs-Rails: None Home Layout: One level Home Equipment: Environmental consultant - 2 wheels;Walker - 4 wheels;Bedside commode;Shower seat;Wheelchair - manual      Prior Function Level of Independence: Independent      Comments: Independent for ADLs/IADLs. Drives and full community ambulator. 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 PT Goal Formulation: With patient Time For Goal Achievement: 10/30/15 Potential to Achieve Goals: Good Progress towards PT goals: Progressing toward goals    Frequency  BID    PT Plan Current plan remains appropriate    Co-evaluation             End of Session Equipment Utilized During Treatment: Gait belt Activity Tolerance: Patient tolerated treatment well;Patient limited by fatigue Patient left: in bed;with call bell/phone within reach;with bed alarm set     Time: 0454-0981 PT Time Calculation (min) (ACUTE ONLY): 26 min  Charges:  $Gait Training: 8-22 mins $Therapeutic Exercise: 8-22 mins                    G Codes:      Dorita Fray Oct 18, 2015, 2:29 PM M. Hettie Holstein, SPT

## 2015-10-18 LAB — CBC
HCT: 32 % — ABNORMAL LOW (ref 35.0–47.0)
HEMOGLOBIN: 11.2 g/dL — AB (ref 12.0–16.0)
MCH: 32.4 pg (ref 26.0–34.0)
MCHC: 34.9 g/dL (ref 32.0–36.0)
MCV: 92.8 fL (ref 80.0–100.0)
PLATELETS: 97 10*3/uL — AB (ref 150–440)
RBC: 3.45 MIL/uL — AB (ref 3.80–5.20)
RDW: 12.4 % (ref 11.5–14.5)
WBC: 7.7 10*3/uL (ref 3.6–11.0)

## 2015-10-18 LAB — BASIC METABOLIC PANEL
ANION GAP: 5 (ref 5–15)
BUN: 10 mg/dL (ref 6–20)
CHLORIDE: 102 mmol/L (ref 101–111)
CO2: 27 mmol/L (ref 22–32)
Calcium: 8.5 mg/dL — ABNORMAL LOW (ref 8.9–10.3)
Creatinine, Ser: 0.53 mg/dL (ref 0.44–1.00)
GFR calc Af Amer: >60 mL/min (ref >60)
GLUCOSE: 100 mg/dL — AB (ref 65–99)
POTASSIUM: 3.6 mmol/L (ref 3.5–5.1)
Sodium: 134 mmol/L — ABNORMAL LOW (ref 135–145)

## 2015-10-18 LAB — SURGICAL PATHOLOGY

## 2015-10-18 NOTE — Care Management Important Message (Signed)
Important Message  Patient Details  Name: [name] MRN: [id number] Date of Birth: Mar 21, 1961   Medicare Important Message Given:       [name] 10/18/2015, 10:02 AM

## 2015-10-18 NOTE — Anesthesia Postprocedure Evaluation (Signed)
Anesthesia Post Note  Patient: Danielle RochesterJanet B [name]  Procedure(s) Performed: Procedure(s) (LRB): TOTAL HIP ARTHROPLASTY ANTERIOR APPROACH (Right)  Patient location during evaluation: PACU Anesthesia Type: General Level of consciousness: awake and alert Pain management: pain level controlled Vital Signs Assessment: post-procedure vital signs reviewed and stable Respiratory status: spontaneous breathing, nonlabored ventilation, respiratory function stable and patient connected to nasal cannula oxygen Cardiovascular status: blood pressure returned to baseline and stable Postop Assessment: no signs of nausea or vomiting Anesthetic complications: no    Last Vitals:  Filed Vitals:   10/18/15 0733 10/18/15 1546  BP: 133/74 129/59  Pulse: 67 71  Temp: 36.9 C 36.9 C  Resp: 18 17    Last Pain:  Filed Vitals:   10/18/15 1546  PainSc: 6                  Yevette EdwardsJames G Ishmail Mcmanamon

## 2015-10-18 NOTE — Progress Notes (Signed)
   Subjective: 2 Days Post-Op Procedure(s) (LRB): TOTAL HIP ARTHROPLASTY ANTERIOR APPROACH (Right) Patient reports pain as 7 on 0-10 scale.   Patient is well, and has had no acute complaints or problems Denies any CP, SOB, ABD pain. We will continue therapy today.  Plan is to go Home after hospital stay.  Objective: Vital signs in last 24 hours: Temp:  [98.1 F (36.7 C)-98.6 F (37 C)] 98.5 F (36.9 C) (04/06 0421) Pulse Rate:  [73-81] 81 (04/06 0421) Resp:  [18-19] 18 (04/06 0421) BP: (121-141)/(59-87) 141/59 mmHg (04/06 0421) SpO2:  [94 %-98 %] 95 % (04/06 0421)  Intake/Output from previous day: 04/05 0701 - 04/06 0700 In: 480 [P.O.:480] Out: 3700 [Urine:3650; Drains:50] Intake/Output this shift:     Recent Labs  10/16/15 1317  HGB 11.8*    Recent Labs  10/16/15 1317  WBC 12.5*  RBC 3.77*  HCT 34.8*  PLT 109*    Recent Labs  10/16/15 1317  CREATININE 0.70   No results for input(s): LABPT, INR in the last 72 hours.  EXAM General - Patient is Alert, Appropriate and Oriented Extremity - Neurovascular intact Sensation intact distally Intact pulses distally Dorsiflexion/Plantar flexion intact Dressing - dressing C/D/I, no drainage and hemovac removed, new dressing applied Motor Function - intact, moving foot and toes well on exam.   Past Medical History  Diagnosis Date  . Hypertension   . COPD (chronic obstructive pulmonary disease) (HCC)   . Shortness of breath dyspnea     with exertion  . Depression   . GERD (gastroesophageal reflux disease)   . Arthritis   . Cirrhosis of liver (HCC)   . Spine pain, lumbar   . Cigarette smoker     Assessment/Plan:   2 Days Post-Op Procedure(s) (LRB): TOTAL HIP ARTHROPLASTY ANTERIOR APPROACH (Right) Active Problems:   Primary osteoarthritis of right hip   Acute post op blood loss anemia    Estimated body mass index is 33.51 kg/(m^2) as calculated from the following:   Height as of this encounter: 5\' 3"   (1.6 m).   Weight as of this encounter: 85.775 kg (189 lb 1.6 oz). Advance diet Up with therapy  Needs BM Labs pending this am  DVT Prophylaxis - Lovenox, Foot Pumps and TED hose Weight-Bearing as tolerated to right leg D/C O2 and Pulse OX and try on Room Air  T. Cranston Neighborhris Aleatha Taite, PA-C [facility] 10/18/2015, 7:17 AM

## 2015-10-18 NOTE — Progress Notes (Signed)
Physical Therapy Treatment Patient Details Name: Danielle RochesterJanet B Taylor MRN: [id number] DOB: 09-11-1960 Today's Date: 10/18/2015    History of Present Illness Pt underwent R THR anterior approach without reported post-op complications. No reported falls in the last 12 months. Pt denies history of prior joint replacement surgeries.  She reports increased pain over the last few months which has affected her ability to complete daily tasks at home.      PT Comments    Pt is progressing towards goals. Pt able to perform bed mobility with use of rails and min assist. Pt able to transfer from sit to stand with RW and min assist. Pt able to ambulate approx. 180 ft with RW and min assist, demonstrating a reciprocal gait pattern and decreased need for cues. Ambulation limited by pts fatigue. Pt performed supine there-ex with mod to no assist. Pt motivated to participate in PT. Pt still needs to complete stair training before discharge. Pt demonstrates deficits in strength, ROM and mobility. Pt would benefit from further skilled therapy; recommend home health PT following discharge from acute hospitalization.   Follow Up Recommendations  Home health PT     Equipment Recommendations       Recommendations for Other Services       Precautions / Restrictions Precautions Precautions: Anterior Hip;Fall Precaution Booklet Issued: Yes (comment) Restrictions Weight Bearing Restrictions: Yes RLE Weight Bearing: Weight bearing as tolerated    Mobility  Bed Mobility Overal bed mobility: Needs Assistance Bed Mobility: Sit to Supine;Supine to Sit     Supine to sit: Min assist Sit to supine: Min assist   General bed mobility comments: Pt able to perfrom bed mobility with use of railings and min assist for R LE. Pt required cues for hand placement.  Transfers Overall transfer level: Needs assistance Equipment used: Rolling walker (2 wheeled) Transfers: Sit to/from Stand Sit to Stand: Min assist         General transfer comment: Pt able to perform sit to stand from EOB using RW and min assist. Pt required increased time for weight-shift and weight bearing on R LE.   Ambulation/Gait Ambulation/Gait assistance: Min assist Ambulation Distance (Feet): 180 Feet Assistive device: Rolling walker (2 wheeled) Gait Pattern/deviations: Step-through pattern Gait velocity: Increased compared to prev. visit   General Gait Details: Pt able to ambulate with RW and min assist. Pt required less cues for proper sequencing and foot placement compared to previous session. Pt demonstrated reciprocal gait pattern with improved ability to move R LE compared to previous session. Pt still relies heavily on use of UE w/ RW. Pt took two brief rest breaks due to UE fatigue.    Stairs            Wheelchair Mobility    Modified Rankin (Stroke Patients Only)       Balance                                    Cognition Arousal/Alertness: Awake/alert Behavior During Therapy: WFL for tasks assessed/performed Overall Cognitive Status: Within Functional Limits for tasks assessed                      Exercises Other Exercises Other Exercises: Pt performed supine ther-ex on R LE including quad sets, glute sets, ankle pumps and SAQ w/ no assist, hip ab/ad with min assist, and SLR with mod assist. Pt demonstrates understanding of the  exercises and does not require any cueing for correct form. All ther-ex performed x15 reps.     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 PT Goal Formulation: With patient Time For Goal Achievement: 10/30/15 Potential to Achieve Goals: Good Progress towards PT goals: Progressing toward goals    Frequency  BID    PT Plan Current plan remains appropriate    Co-evaluation             End of Session Equipment Utilized During Treatment: Gait belt Activity Tolerance: Patient tolerated treatment well Patient left: in bed;with bed alarm set;with call bell/phone within reach;with SCD's reapplied     Time: 0454-0981 PT Time Calculation (min) (ACUTE ONLY): 26 min  Charges:                       G Codes:      Dorita Fray 09-Nov-2015, 1:52 PM M. Hettie Holstein, SPT

## 2015-10-18 NOTE — Progress Notes (Signed)
Physical Therapy Treatment Patient Details Name: SAFIRE GORDIN MRN: [id number] DOB: 04/21/1961 Today's Date: 10/18/2015    History of Present Illness Pt underwent R THR anterior approach without reported post-op complications. No reported falls in the last 12 months. Pt denies history of prior joint replacement surgeries.  She reports increased pain over the last few months which has affected her ability to complete daily tasks at home.      PT Comments    Pt is progressing towards goals. Pt able to perform bed mobility with mod assist and use of bed railings. Pt able to transfer from sit to stand from EOB using RW and min assist. Pt able to ambulate approx. 100 ft using RW and mod assist with frequent cueing regarding proper sequencing and foot placement. Pt still demonstrates difficulty w/ R LE toe off and swing, however shows improvement compared to previous visit. Pt able to perform supine there-ex with mod to no assist. Pt showing improvement, but still demonstrates deficits in strength, ROM, and mobility. Pt motivated to participate in PT. Pt would benefit from further skilled therapy to address deficits; recommend pt sent to SNF after discharge from acute hospitalization.   Follow Up Recommendations  SNF     Equipment Recommendations  Rolling walker with 5" wheels    Recommendations for Other Services       Precautions / Restrictions Precautions Precautions: Anterior Hip;Fall Precaution Booklet Issued: Yes (comment) Restrictions Weight Bearing Restrictions: Yes RLE Weight Bearing: Weight bearing as tolerated    Mobility  Bed Mobility Overal bed mobility: Needs Assistance Bed Mobility: Supine to Sit     Supine to sit: Mod assist     General bed mobility comments: Pt able to perform bed mobility with use of railings and mod assist for R LE and trunk. Pt required cues for hand placement.   Transfers Overall transfer level: Needs assistance Equipment used: Rolling  walker (2 wheeled) Transfers: Sit to/from Stand Sit to Stand: Min assist         General transfer comment: Pt able to perform sit to stand from EOB using RW and min assist. Pt required heavy cues regarding hand placement. Pt required increased time for weight shift and weight bearing on R LE, however less time required compared to prev. session.  Ambulation/Gait Ambulation/Gait assistance: Mod assist Ambulation Distance (Feet): 100 Feet Assistive device: Rolling walker (2 wheeled) Gait Pattern/deviations: Step-to pattern Gait velocity: Decreased   General Gait Details: Pt able to ambulate with RW and mod assist. Pt required cues regarding sequencing and proper foot placement. However, less cues provided compared to previous visit. Pt still demonstrates increased time needed for R LE toe off and swing, however less assist needed this visit. Pt took one rest break during ambulation due to fatigue/dizziness.   Stairs            Wheelchair Mobility    Modified Rankin (Stroke Patients Only)       Balance                                    Cognition Arousal/Alertness: Awake/alert Behavior During Therapy: WFL for tasks assessed/performed Overall Cognitive Status: Within Functional Limits for tasks assessed                      Exercises Other Exercises Other Exercises: Pt performed supine ther-ex on R LE including quad sets, glute sets  and SAQ with no assist, hip ab/ad and SLR with mod assist. All ther-ex performed x15 reps.    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 PT Goal Formulation: With patient Time For Goal Achievement: 10/30/15 Potential to Achieve Goals: Good Progress towards PT goals: Progressing toward goals    Frequency  BID    PT Plan Current plan remains appropriate    Co-evaluation             End of Session Equipment Utilized During Treatment: Gait belt Activity Tolerance: Patient tolerated treatment well Patient left: in chair;with call bell/phone within reach     Time: 0830-0855 PT Time Calculation (min) (ACUTE ONLY): 25 min  Charges:                       G Codes:      Dorita FrayMartha Friedrich Harriott 10/18/2015, 11:20 AM M. Hettie Holsteinlaire Catlin Doria, SPT

## 2015-10-19 MED ORDER — OXYCODONE HCL 5 MG PO TABS: 5.0000 mg | ORAL_TABLET | ORAL | Status: DC | PRN

## 2015-10-19 MED ORDER — ENOXAPARIN SODIUM 40 MG/0.4ML ~~LOC~~ SOLN: 40.0000 mg | SUBCUTANEOUS | Status: DC

## 2015-10-19 NOTE — Discharge Instructions (Signed)

## 2015-10-19 NOTE — Progress Notes (Signed)
Instructions reviewed with the pt and her husband.  lovenox education was done.  rx given for oxycodone and lovenox.  Pt was taking antibiotic for uti prior to admission.  She still is voiding frequently and urine has a foul smell.  Thayer OhmChris notified and he is going to call in a new rx for macrodantin.  Pt sent out via wheelchiar to waiting car

## 2015-10-19 NOTE — Discharge Summary (Signed)
Physician Discharge Summary  Patient ID: [name] MRN: [id number] DOB/AGE: [birth year] 55 y.o.  Admit date: 10/16/2015 Discharge date: 10/19/2015  Admission Diagnoses:  OSTEOARTHRITIS   Discharge Diagnoses: Patient Active Problem List   Diagnosis Date Noted  . Primary osteoarthritis of right hip 10/16/2015    Past Medical History  Diagnosis Date  . Hypertension   . COPD (chronic obstructive pulmonary disease) (HCC)   . Shortness of breath dyspnea     with exertion  . Depression   . GERD (gastroesophageal reflux disease)   . Arthritis   . Cirrhosis of liver (HCC)   . Spine pain, lumbar   . Cigarette smoker      Transfusion: none   Consultants (if any):    Discharged Condition: Improved  Hospital Course: TAMMEY DEEG is an 55 y.o. female who was admitted 10/16/2015 with a diagnosis of <principal problem not specified> and went to the operating room on 10/16/2015 and underwent the above named procedures.    Surgeries: Procedure(s): TOTAL HIP ARTHROPLASTY ANTERIOR APPROACH on 10/16/2015 Patient tolerated the surgery well. Taken to PACU where she was stabilized and then transferred to the orthopedic floor.  Started on Lovenox 40 q 24 hrs. Foot pumps applied bilaterally at 80 mm. Heels elevated on bed with rolled towels. No evidence of DVT. Negative Homan. Physical therapy started on day #1 for gait training and transfer. OT started day #1 for ADL and assisted devices.  Patient's foley was d/c on day #1. Patient's IV and hemovac was d/c on day #2.  On post op day #3 patient was stable and ready for discharge to home with HHPT.  Implants: Medacta AMIS collared 3 standard stem with 48 mm Mpact cup DM liner and S 28 mm head  She was given perioperative antibiotics:  Anti-infectives    Start     Dose/Rate Route Frequency Ordered Stop   10/16/15 1300  ceFAZolin (ANCEF) IVPB 2g/100 mL premix     2 g 200 mL/hr over 30 Minutes Intravenous Every 6 hours 10/16/15 1147 10/17/15  0302   10/16/15 0600  ceFAZolin (ANCEF) IVPB 2g/100 mL premix     2 g 200 mL/hr over 30 Minutes Intravenous  Once 10/16/15 0557 10/16/15 0735   10/16/15 0559  ceFAZolin (ANCEF) 2-4 GM/100ML-% IVPB    Comments:  Darrel Hoover: cabinet override      10/16/15 0559 10/16/15 1759    .  She was given sequential compression devices, early ambulation, and lovenox for DVT prophylaxis.  She benefited maximally from the hospital stay and there were no complications.    Recent vital signs:  Filed Vitals:   10/18/15 1952 10/19/15 0306  BP: 133/65 145/69  Pulse: 84 75  Temp: 99.6 F (37.6 C) 98.1 F (36.7 C)  Resp: 18 18    Recent laboratory studies:  Lab Results  Component Value Date   HGB 11.2* 10/18/2015   HGB 11.8* 10/16/2015   HGB 13.9 10/09/2015   Lab Results  Component Value Date   WBC 7.7 10/18/2015   PLT 97* 10/18/2015   Lab Results  Component Value Date   INR 0.95 10/09/2015   Lab Results  Component Value Date   NA 134* 10/18/2015   K 3.6 10/18/2015   CL 102 10/18/2015   CO2 27 10/18/2015   BUN 10 10/18/2015   CREATININE 0.53 10/18/2015   GLUCOSE 100* 10/18/2015    Discharge Medications:     Medication List    STOP taking these medications  HYDROcodone-acetaminophen 5-325 MG tablet  Commonly known as:  NORCO/VICODIN      TAKE these medications        ADVAIR DISKUS 250-50 MCG/DOSE Aepb  Generic drug:  Fluticasone-Salmeterol  Inhale 1 puff into the lungs 2 (two) times daily.     albuterol 108 (90 Base) MCG/ACT inhaler  Commonly known as:  PROVENTIL HFA;VENTOLIN HFA  Inhale 2 puffs into the lungs every 4 (four) hours as needed for wheezing or shortness of breath.     enoxaparin 40 MG/0.4ML injection  Commonly known as:  LOVENOX  Inject 0.4 mLs (40 mg total) into the skin daily. X 14 days     meloxicam 15 MG tablet  Commonly known as:  MOBIC  Take 15 mg by mouth daily.     nadolol 20 MG tablet  Commonly known as:  CORGARD  Take 40 mg  by mouth daily.     nitrofurantoin (macrocrystal-monohydrate) 100 MG capsule  Commonly known as:  MACROBID  Take 100 mg by mouth 2 (two) times daily.     oxyCODONE 5 MG immediate release tablet  Commonly known as:  Oxy IR/ROXICODONE  Take 1-2 tablets (5-10 mg total) by mouth every 3 (three) hours as needed for breakthrough pain.     pantoprazole 40 MG tablet  Commonly known as:  PROTONIX  Take 40 mg by mouth daily.     spironolactone 50 MG tablet  Commonly known as:  ALDACTONE  Take 50 mg by mouth daily.     tiotropium 18 MCG inhalation capsule  Commonly known as:  SPIRIVA  Place 18 mcg into inhaler and inhale daily.     Vitamin B Complex Tabs  Take 1 tablet by mouth daily.     Vitamin D3 5000 units Caps  Take 1 capsule by mouth daily.        Diagnostic Studies: Dg Hip Operative Unilat W Or W/o Pelvis Right  10/16/2015  CLINICAL DATA:  Right total hip replacement, anterior approach EXAM: OPERATIVE right HIP (WITH PELVIS IF PERFORMED) 3 VIEWS TECHNIQUE: Fluoroscopic spot image(s) were submitted for interpretation post-operatively. COMPARISON:  MR right hip of 01/03/2013 . FINDINGS: Initial C-arm spot films show moderate degenerative change involving the right hip with loss of joint space and sclerosis with spurring. Additional C-arm images show right total hip replacement with components in good position on the images obtained. The entire femoral prosthetic stem is not included on the C-arm images. No complicating features are seen IMPRESSION: Right total hip replacement components in good position. No complicating features. Electronically Signed   By: Dwyane DeePaul  Barry M.D.   On: 10/16/2015 09:21   Dg Hip Unilat W Or W/o Pelvis 2-3 Views Right  10/16/2015  CLINICAL DATA:  Status post total hip replacement EXAM: DG HIP 2-3V RIGHT COMPARISON:  None. FINDINGS: Frontal and lateral views were obtained. There is a total hip replacement on the right with prosthetic components appearing  well-seated. No acute fracture or dislocation. There are surgical drains in the lateral hip joint region. IMPRESSION: Status post total hip replacement right with prosthetic components appearing well-seated. No acute fracture or dislocation. Electronically Signed   By: Bretta BangWilliam  Woodruff III M.D.   On: 10/16/2015 10:06    Disposition:         Follow-up Information    Follow up with MENZ,MICHAEL, MD In 2 weeks.   Specialty:  Orthopedic Surgery   Why:  For staple removal and skin check   Contact information:   [address] [facility] [place] [address] [phone]        Signed: Patience Musca 10/19/2015, 7:55 AM

## 2015-10-19 NOTE — Care Management (Signed)
[facility] has been notified of patient discharge to home today. No further RNCM needs. Case closed.

## 2015-10-19 NOTE — Care Management Important Message (Signed)
Important Message  Patient Details  Name: Danielle RochesterJanet B Yan MRN: [id number] Date of Birth: 27-Nov-1960   Medicare Important Message Given:  Yes    Olegario MessierKathy A Azalie Harbeck 10/19/2015, 9:47 AM

## 2015-10-19 NOTE — Progress Notes (Signed)
Physical Therapy Treatment Patient Details Name: [name] MRN: [id number] DOB: 06-Oct-1960 Today's Date: 10/19/2015    History of Present Illness Pt underwent R THR anterior approach without reported post-op complications. No reported falls in the last 12 months. Pt denies history of prior joint replacement surgeries.  She reports increased pain over the last few months which has affected her ability to complete daily tasks at home.      PT Comments    Pt is progressing towards goals. Pt able to perform bed mobility with min assist for R LE. Pt able to perform sit to stand with RW and CGA, requiring increased time for weight-bearing once standing. Pt able to ambulate approx. 100 ft. with RW and CGA. Pt has shown significant improvements w/ ambulation, demonstrating a step-through gait pattern and increased ability to move R LE during swing. Pt able to perform stair training for 1 stair, 2 times using RW and CGA. Pt performed seated there-ex x15 reps with mod to no assist. Pt motivated to participate in PT. Pt demonstrates deficits in strength, ROM, and mobility. Pt would benefit from further skilled PT to address deficits and return to PLOF; recommend pt receive home health PT after discharge from acute hospitalization.   Follow Up Recommendations  Home health PT     Equipment Recommendations  Rolling walker with 5" wheels    Recommendations for Other Services       Precautions / Restrictions Precautions Precautions: Anterior Hip;Fall Precaution Booklet Issued: Yes (comment) Restrictions Weight Bearing Restrictions: Yes RLE Weight Bearing: Weight bearing as tolerated    Mobility  Bed Mobility Overal bed mobility: Needs Assistance Bed Mobility: Supine to Sit     Supine to sit: Min assist     General bed mobility comments: Pt able to perfrom bed mobility with use of railings and min assist for R LE. Pt no longer requires cues for hand placement.  Transfers Overall  transfer level: Needs assistance Equipment used: Rolling walker (2 wheeled) Transfers: Sit to/from Stand Sit to Stand: Min guard         General transfer comment: Pt able to perform sit to stand using RW and CGA. Pt demonstrates proper hand placement during transfer. Pt still requires increased time for weight-shift and weight bearing on R LE due to stiffness.  Ambulation/Gait Ambulation/Gait assistance: Min guard Ambulation Distance (Feet): 100 Feet Assistive device: Rolling walker (2 wheeled) Gait Pattern/deviations: Step-to pattern;Step-through pattern Gait velocity: Increased compared to prev. visit   General Gait Details: Pt able to ambulate using RW and CGA. Pt demonstrated step-to gait pattern initially and transitioned to step-through after a few minutes of ambulation. Pt provided cues regarding upright posture. Ambulation limited by fatigue. Pt relies heavily on use of UE w/ RW.    Stairs Stairs: Yes Stairs assistance: Min guard Stair Management: No rails;Backwards;With walker Number of Stairs: 1 General stair comments: Pt able to ambulate 1 stair x2 w/ RW and CGA. Pt ambulated stairs going up backwards and down forwards with step-to pattern. Pt cued on walker and foot placement. Pt demonstrated understanding and safe ambulation of stairs.    Wheelchair Mobility    Modified Rankin (Stroke Patients Only)       Balance                                    Cognition Arousal/Alertness: Awake/alert Behavior During Therapy: WFL for tasks assessed/performed Overall Cognitive  Status: Within Functional Limits for tasks assessed                      Exercises Other Exercises Other Exercises: Pt performed seated ther-ex on R LE including quad sets, ankle pumps and SAQ w/ no assist, hip ab/ad w/ min assist, and SLR w/mod assist. All ther-ex performed x15 reps.     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 PT Goal Formulation: With patient Time For Goal Achievement: 10/30/15 Potential to Achieve Goals: Good Progress towards PT goals: Progressing toward goals    Frequency  BID    PT Plan Current plan remains appropriate    Co-evaluation             End of Session Equipment Utilized During Treatment: Gait belt Activity Tolerance: Patient tolerated treatment well Patient left: in chair;with call bell/phone within reach     Time: 0832-0900 PT Time Calculation (min) (ACUTE ONLY): 28 min  Charges:  $Gait Training: 8-22 mins $Therapeutic Exercise: 8-22 mins                    G Codes:      Dorita FrayMartha Haidyn Chadderdon 10/19/2015, 10:46 AM M. Hettie Holsteinlaire Othon Guardia, SPT

## 2015-10-19 NOTE — Progress Notes (Signed)
   Subjective: 3 Days Post-Op Procedure(s) (LRB): TOTAL HIP ARTHROPLASTY ANTERIOR APPROACH (Right) Patient reports pain as moderate.   Patient is well, and has had no acute complaints or problems Denies any CP, SOB, ABD pain. We will continue therapy today.  Plan is to go Home after hospital stay.  Objective: Vital signs in last 24 hours: Temp:  [98.1 F (36.7 C)-99.6 F (37.6 C)] 98.1 F (36.7 C) (04/07 0306) Pulse Rate:  [71-84] 75 (04/07 0306) Resp:  [17-18] 18 (04/07 0306) BP: (129-145)/(59-69) 145/69 mmHg (04/07 0306) SpO2:  [97 %-98 %] 98 % (04/07 0306)  Intake/Output from previous day: 04/06 0701 - 04/07 0700 In: 0  Out: 350 [Urine:350] Intake/Output this shift:     Recent Labs  10/16/15 1317 10/18/15 0752  HGB 11.8* 11.2*    Recent Labs  10/16/15 1317 10/18/15 0752  WBC 12.5* 7.7  RBC 3.77* 3.45*  HCT 34.8* 32.0*  PLT 109* 97*    Recent Labs  10/16/15 1317 10/18/15 0752  NA  --  134*  K  --  3.6  CL  --  102  CO2  --  27  BUN  --  10  CREATININE 0.70 0.53  GLUCOSE  --  100*  CALCIUM  --  8.5*   No results for input(s): LABPT, INR in the last 72 hours.  EXAM General - Patient is Alert, Appropriate and Oriented Extremity - Neurovascular intact Sensation intact distally Intact pulses distally Dorsiflexion/Plantar flexion intact Dressing - dressing C/D/I Motor Function - intact, moving foot and toes well on exam.   Past Medical History  Diagnosis Date  . Hypertension   . COPD (chronic obstructive pulmonary disease) (HCC)   . Shortness of breath dyspnea     with exertion  . Depression   . GERD (gastroesophageal reflux disease)   . Arthritis   . Cirrhosis of liver (HCC)   . Spine pain, lumbar   . Cigarette smoker     Assessment/Plan:   3 Days Post-Op Procedure(s) (LRB): TOTAL HIP ARTHROPLASTY ANTERIOR APPROACH (Right) Active Problems:   Primary osteoarthritis of right hip   Acute post op blood loss anemia    Estimated body  mass index is 33.51 kg/(m^2) as calculated from the following:   Height as of this encounter: 5\' 3"  (1.6 m).   Weight as of this encounter: 85.775 kg (189 lb 1.6 oz). Advance diet Up with therapy  Discharge home today with home health PT Follow up with [facility] in 2 weeks  DVT Prophylaxis - Lovenox, Foot Pumps and TED hose Weight-Bearing as tolerated to right leg D/C O2 and Pulse OX and try on Room Air  T. Cranston Neighborhris Gaines, PA-C [facility] 10/19/2015, 8:04 AM

## 2015-11-05 ENCOUNTER — Other Ambulatory Visit: Payer: Self-pay | Admitting: Orthopedic Surgery

## 2015-11-05 DIAGNOSIS — M5431 Sciatica, right side: Secondary | ICD-10-CM

## 2015-11-05 DIAGNOSIS — M5432 Sciatica, left side: Principal | ICD-10-CM

## 2015-11-11 ENCOUNTER — Encounter: Payer: Self-pay | Admitting: Emergency Medicine

## 2015-11-11 ENCOUNTER — Emergency Department
Admission: EM | Admit: 2015-11-11 | Discharge: 2015-11-11 | Disposition: A | Discharge status: Home / Self Care | Payer: Medicare Other | Attending: Emergency Medicine | Admitting: Emergency Medicine

## 2015-11-11 DIAGNOSIS — J449 Chronic obstructive pulmonary disease, unspecified: Secondary | ICD-10-CM | POA: Diagnosis not present

## 2015-11-11 DIAGNOSIS — I10 Essential (primary) hypertension: Secondary | ICD-10-CM | POA: Insufficient documentation

## 2015-11-11 DIAGNOSIS — M545 Low back pain: Secondary | ICD-10-CM | POA: Diagnosis present

## 2015-11-11 DIAGNOSIS — M5441 Lumbago with sciatica, right side: Secondary | ICD-10-CM

## 2015-11-11 DIAGNOSIS — F17210 Nicotine dependence, cigarettes, uncomplicated: Secondary | ICD-10-CM | POA: Insufficient documentation

## 2015-11-11 DIAGNOSIS — F329 Major depressive disorder, single episode, unspecified: Secondary | ICD-10-CM | POA: Insufficient documentation

## 2015-11-11 MED ORDER — GABAPENTIN 300 MG PO CAPS: 300.0000 mg | ORAL_CAPSULE | Freq: Four times a day (QID) | ORAL | Status: DC

## 2015-11-11 MED ORDER — OXYCODONE HCL 5 MG PO TABS: 5.0000 mg | ORAL_TABLET | ORAL | Status: DC | PRN

## 2015-11-11 NOTE — ED Notes (Signed)
Patient presents to the ED with right hip pain x 2.5 weeks.  Patient states, "It feels like nerve pain."  Patient states she had right hip surgery on April 4th.  Patient has been seen by her surgeon and was prescribed gabapentin and patient states it is not improving her pain.

## 2015-11-11 NOTE — ED Provider Notes (Signed)
[facility] Emergency Department Provider Note  ____________________________________________  Time seen: Approximately 3:41 PM  I have reviewed the triage vital signs and the nursing notes.   HISTORY  Chief Complaint Hip Pain    HPI [name] is a 55 y.o. female who presents emergency department complaining of lower back pain. Patient had a hip replacement done by Dr. Rosita Kea performed on 10/16/2015. Patient states that immediately status post surgery she was relatively pain-free. However, she has developed lower back pain with radicular symptoms to the right leg since surgery. Patient has been seen multiple times by her orthopedic surgeon and is scheduled for an MRI on 11/21/2015. Patient has been prescribed oxycodone 5 mg with gabapentin. Patient states that she was given a 10 day supply which expire today. She called orthopedics and they did not return her phone call to refill medications today. Patient states that she knows the ER is unable to fill medications and she presented to [facility] clinic acute care but was told "we can't see you, you need to go to the ER." Patient denies any change to the pain. She denies any bowel or bladder dysfunction, saddle anesthesia, a sending paresthesias. Patient is just requesting refill of medication until tomorrow at which time she can call her orthopedic surgeon.   Past Medical History  Diagnosis Date  . Hypertension   . COPD (chronic obstructive pulmonary disease) (HCC)   . Shortness of breath dyspnea     with exertion  . Depression   . GERD (gastroesophageal reflux disease)   . Arthritis   . Cirrhosis of liver (HCC)   . Spine pain, lumbar   . Cigarette smoker     Patient Active Problem List   Diagnosis Date Noted  . Primary osteoarthritis of right hip 10/16/2015    Past Surgical History  Procedure Laterality Date  . Tubal ligation    . Cesarean section    . Dilation and curettage of uterus    .  Colonoscopy    . Esophagogastroduodenoscopy endoscopy    . Joint replacement    . Total hip arthroplasty Right 10/16/2015    Procedure: TOTAL HIP ARTHROPLASTY ANTERIOR APPROACH;  Surgeon: Kennedy Bucker, MD;  Location: [facility] ORS;  Service: Orthopedics;  Laterality: Right;    Current Outpatient Rx  Name  Route  Sig  Dispense  Refill  . albuterol (PROVENTIL HFA;VENTOLIN HFA) 108 (90 Base) MCG/ACT inhaler   Inhalation   Inhale 2 puffs into the lungs every 4 (four) hours as needed for wheezing or shortness of breath.         . B Complex Vitamins (VITAMIN B COMPLEX) TABS   Oral   Take 1 tablet by mouth daily.         . Cholecalciferol (VITAMIN D3) 5000 units CAPS   Oral   Take 1 capsule by mouth daily.         Marland Kitchen enoxaparin (LOVENOX) 40 MG/0.4ML injection   Subcutaneous   Inject 0.4 mLs (40 mg total) into the skin daily. X 14 days   14 Syringe   0   . Fluticasone-Salmeterol (ADVAIR DISKUS) 250-50 MCG/DOSE AEPB   Inhalation   Inhale 1 puff into the lungs 2 (two) times daily.         Marland Kitchen gabapentin (NEURONTIN) 300 MG capsule   Oral   Take 1 capsule (300 mg total) by mouth 4 (four) times daily.   120 capsule   0   . meloxicam (MOBIC) 15 MG tablet  Oral   Take 15 mg by mouth daily.         . nadolol (CORGARD) 20 MG tablet   Oral   Take 40 mg by mouth daily.         . nitrofurantoin, macrocrystal-monohydrate, (MACROBID) 100 MG capsule   Oral   Take 100 mg by mouth 2 (two) times daily.         Marland Kitchen. oxyCODONE (OXY IR/ROXICODONE) 5 MG immediate release tablet   Oral   Take 1-2 tablets (5-10 mg total) by mouth every 3 (three) hours as needed for breakthrough pain.   60 tablet   0   . oxyCODONE (ROXICODONE) 5 MG immediate release tablet   Oral   Take 1 tablet (5 mg total) by mouth every 4 (four) hours as needed for severe pain.   8 tablet   0   . pantoprazole (PROTONIX) 40 MG tablet   Oral   Take 40 mg by mouth daily.         Marland Kitchen. spironolactone (ALDACTONE) 50 MG  tablet   Oral   Take 50 mg by mouth daily.         Marland Kitchen. tiotropium (SPIRIVA) 18 MCG inhalation capsule   Inhalation   Place 18 mcg into inhaler and inhale daily.           Allergies Chantix and Codeine  Family History  Problem Relation Age of Onset  . Breast cancer Mother [id number]  . Breast cancer Maternal Aunt 360    Social History Social History  Substance Use Topics  . Smoking status: Current Every Day Smoker -- 1.00 packs/day    Types: Cigarettes  . Smokeless tobacco: Never Used  . Alcohol Use: No     Review of Systems  Constitutional: No fever/chills Eyes: No visual changes. No discharge ENT: No upper respiratory complaints. Cardiovascular: no chest pain. Respiratory: no cough. No SOB. Gastrointestinal: No abdominal pain.  No nausea, no vomiting.  No diarrhea.  No constipation. Musculoskeletal: Positive for lower back pain. Skin: Negative for rash, abrasions, lacerations, ecchymosis. Neurological: Negative for headaches, focal weakness or numbness. 10-point ROS otherwise negative.  ____________________________________________   PHYSICAL EXAM:  VITAL SIGNS: ED Triage Vitals  Enc Vitals Group     BP 11/11/15 1449 120/92 mmHg     Pulse Rate 11/11/15 1449 92     Resp 11/11/15 1449 20     Temp 11/11/15 1449 98.4 F (36.9 C)     Temp Source 11/11/15 1449 Oral     SpO2 11/11/15 1449 98 %     Weight 11/11/15 1439 186 lb (84.369 kg)     Height 11/11/15 1439 5\' 3"  (1.6 m)     Head Cir --      Peak Flow --      Pain Score 11/11/15 1439 9     Pain Loc --      Pain Edu? --      Excl. in GC? --      Constitutional: Alert and oriented. Well appearing and in no acute distress. Eyes: Conjunctivae are normal. PERRL. EOMI. Head: Atraumatic. ENT:      Ears:       Nose: No congestion/rhinnorhea.      Mouth/Throat: Mucous membranes are moist.  Neck: No stridor.  Cardiovascular: Normal rate, regular rhythm. Normal S1 and S2.  Good peripheral  circulation. Respiratory: Normal respiratory effort without tachypnea or retractions. Lungs CTAB. Good air entry to the bases with no decreased or absent breath sounds.  Musculoskeletal: No visible deformity to spine upon inspection. Good range of motion to the back. Patient is nontender to palpation over the lumbar spine. No palpable abnormalities. Positive straight leg raise on the right. Dorsalis pedis pulses appreciated bilaterally lower extremities. Sensation intact and equal lower extremities. Neurologic:  Normal speech and language. No gross focal neurologic deficits are appreciated.  Skin:  Skin is warm, dry and intact. No rash noted. Psychiatric: Mood and affect are normal. Speech and behavior are normal. Patient exhibits appropriate insight and judgement.   ____________________________________________   LABS (all labs ordered are listed, but only abnormal results are displayed)  Labs Reviewed - No data to display ____________________________________________  EKG   ____________________________________________  RADIOLOGY  No results found.  ____________________________________________    PROCEDURES  Procedure(s) performed:       Medications - No data to display   ____________________________________________   INITIAL IMPRESSION / ASSESSMENT AND PLAN / ED COURSE  Pertinent labs & imaging results that were available during my care of the patient were reviewed by me and considered in my medical decision making (see chart for details).  Patient's diagnosis is consistent with lumbago with right-sided sciatica. While this has been ongoing 3 weeks this is an acute onset. Patient has had no change in symptoms and is only requesting medication refill until tomorrow at which time she can talk to her orthopedic surgeon. Patient will be refilled on her gabapentin and given a 2 day prescription for oxycodone until she can talk to her orthopedic surgeon. With no changes from  baseline pain complaints and exam being reassuring, no imaging is ordered at this time. Patient does have an MRI scheduled in 10 days.  Patient is given ED precautions to return to the ED for any worsening or new symptoms.     ____________________________________________  FINAL CLINICAL IMPRESSION(S) / ED DIAGNOSES  Final diagnoses:  Acute bilateral low back pain with right-sided sciatica      NEW MEDICATIONS STARTED DURING THIS VISIT:  New Prescriptions   GABAPENTIN (NEURONTIN) 300 MG CAPSULE    Take 1 capsule (300 mg total) by mouth 4 (four) times daily.   OXYCODONE (ROXICODONE) 5 MG IMMEDIATE RELEASE TABLET    Take 1 tablet (5 mg total) by mouth every 4 (four) hours as needed for severe pain.        This chart was dictated using voice recognition software/Dragon. Despite best efforts to proofread, errors can occur which can change the meaning. Any change was purely unintentional.    Racheal Patches, PA-C 11/11/15 1604  Governor Rooks, MD 11/11/15 [phone]

## 2015-11-11 NOTE — ED Notes (Signed)
Had hip replacement 10/16/15 = placed on gabapentin and oxycodone with no relief. Has mri scheduled 11/21/15. Out of both meds

## 2015-11-11 NOTE — Discharge Instructions (Signed)
Radicular Pain °Radicular pain in either the arm or leg is usually from a bulging or herniated disk in the spine. A piece of the herniated disk may press against the nerves as the nerves exit the spine. This causes pain which is felt at the tips of the nerves down the arm or leg. Other causes of radicular pain may include: °· Fractures. °· Heart disease. °· Cancer. °· An abnormal and usually degenerative state of the nervous system or nerves (neuropathy). °Diagnosis may require CT or MRI scanning to determine the primary cause.  °Nerves that start at the neck (nerve roots) may cause radicular pain in the outer shoulder and arm. It can spread down to the thumb and fingers. The symptoms vary depending on which nerve root has been affected. In most cases radicular pain improves with conservative treatment. Neck problems may require physical therapy, a neck collar, or cervical traction. Treatment may take many weeks, and surgery may be considered if the symptoms do not improve.  °Conservative treatment is also recommended for sciatica. Sciatica causes pain to radiate from the lower back or buttock area down the leg into the foot. Often there is a history of back problems. Most patients with sciatica are better after 2 to 4 weeks of rest and other supportive care. Short term bed rest can reduce the disk pressure considerably. Sitting, however, is not a good position since this increases the pressure on the disk. You should avoid bending, lifting, and all other activities which make the problem worse. Traction can be used in severe cases. Surgery is usually reserved for patients who do not improve within the first months of treatment. °Only take over-the-counter or prescription medicines for pain, discomfort, or fever as directed by your caregiver. Narcotics and muscle relaxants may help by relieving more severe pain and spasm and by providing mild sedation. Cold or massage can give significant relief. Spinal manipulation  is not recommended. It can increase the degree of disc protrusion. Epidural steroid injections are often effective treatment for radicular pain. These injections deliver medicine to the spinal nerve in the space between the protective covering of the spinal cord and back bones (vertebrae). Your caregiver can give you more information about steroid injections. These injections are most effective when given within two weeks of the onset of pain.  °You should see your caregiver for follow up care as recommended. A program for neck and back injury rehabilitation with stretching and strengthening exercises is an important part of management.  °SEEK IMMEDIATE MEDICAL CARE IF: °· You develop increased pain, weakness, or numbness in your arm or leg. °· You develop difficulty with bladder or bowel control. °· You develop abdominal pain. °  °This information is not intended to replace advice given to you by your health care provider. Make sure you discuss any questions you have with your health care provider. °  °Document Released: 08/07/2004 Document Revised: 07/21/2014 Document Reviewed: 01/24/2015 °Elsevier Interactive Patient Education ©2016 [employer] ° °

## 2015-11-21 ENCOUNTER — Ambulatory Visit
Admission: RE | Admit: 2015-11-21 | Discharge: 2015-11-21 | Disposition: A | Discharge status: Home / Self Care | Payer: Medicare Other | Source: Ambulatory Visit | Attending: Orthopedic Surgery | Admitting: Orthopedic Surgery

## 2015-11-21 ENCOUNTER — Inpatient Hospital Stay
Admission: EM | Admit: 2015-11-21 | Discharge: 2015-11-22 | DRG: 093 | Disposition: A | Discharge status: Home / Self Care | Payer: Medicare Other | Attending: Internal Medicine | Admitting: Internal Medicine

## 2015-11-21 ENCOUNTER — Encounter: Payer: Self-pay | Admitting: *Deleted

## 2015-11-21 DIAGNOSIS — M5136 Other intervertebral disc degeneration, lumbar region: Secondary | ICD-10-CM

## 2015-11-21 DIAGNOSIS — M5432 Sciatica, left side: Principal | ICD-10-CM

## 2015-11-21 DIAGNOSIS — I10 Essential (primary) hypertension: Secondary | ICD-10-CM | POA: Diagnosis present

## 2015-11-21 DIAGNOSIS — Z9889 Other specified postprocedural states: Secondary | ICD-10-CM | POA: Diagnosis not present

## 2015-11-21 DIAGNOSIS — Z79899 Other long term (current) drug therapy: Secondary | ICD-10-CM | POA: Diagnosis not present

## 2015-11-21 DIAGNOSIS — Z803 Family history of malignant neoplasm of breast: Secondary | ICD-10-CM | POA: Diagnosis not present

## 2015-11-21 DIAGNOSIS — J449 Chronic obstructive pulmonary disease, unspecified: Secondary | ICD-10-CM | POA: Diagnosis present

## 2015-11-21 DIAGNOSIS — Z886 Allergy status to analgesic agent status: Secondary | ICD-10-CM

## 2015-11-21 DIAGNOSIS — G9511 Acute infarction of spinal cord (embolic) (nonembolic): Secondary | ICD-10-CM

## 2015-11-21 DIAGNOSIS — K7030 Alcoholic cirrhosis of liver without ascites: Secondary | ICD-10-CM | POA: Diagnosis present

## 2015-11-21 DIAGNOSIS — Z888 Allergy status to other drugs, medicaments and biological substances status: Secondary | ICD-10-CM | POA: Diagnosis not present

## 2015-11-21 DIAGNOSIS — M5431 Sciatica, right side: Secondary | ICD-10-CM | POA: Insufficient documentation

## 2015-11-21 DIAGNOSIS — R937 Abnormal findings on diagnostic imaging of other parts of musculoskeletal system: Secondary | ICD-10-CM | POA: Insufficient documentation

## 2015-11-21 DIAGNOSIS — F17210 Nicotine dependence, cigarettes, uncomplicated: Secondary | ICD-10-CM | POA: Diagnosis present

## 2015-11-21 DIAGNOSIS — Z96641 Presence of right artificial hip joint: Secondary | ICD-10-CM | POA: Diagnosis present

## 2015-11-21 DIAGNOSIS — K219 Gastro-esophageal reflux disease without esophagitis: Secondary | ICD-10-CM | POA: Diagnosis present

## 2015-11-21 DIAGNOSIS — M1990 Unspecified osteoarthritis, unspecified site: Secondary | ICD-10-CM | POA: Diagnosis present

## 2015-11-21 DIAGNOSIS — Z7951 Long term (current) use of inhaled steroids: Secondary | ICD-10-CM | POA: Diagnosis not present

## 2015-11-21 DIAGNOSIS — Z79891 Long term (current) use of opiate analgesic: Secondary | ICD-10-CM

## 2015-11-21 DIAGNOSIS — Z9851 Tubal ligation status: Secondary | ICD-10-CM

## 2015-11-21 HISTORY — DX: Acute infarction of spinal cord (embolic) (nonembolic): G95.11

## 2015-11-21 LAB — BASIC METABOLIC PANEL
ANION GAP: 9 (ref 5–15)
BUN: 19 mg/dL (ref 6–20)
CALCIUM: 9.3 mg/dL (ref 8.9–10.3)
CO2: 22 mmol/L (ref 22–32)
Chloride: 100 mmol/L — ABNORMAL LOW (ref 101–111)
Creatinine, Ser: 0.98 mg/dL (ref 0.44–1.00)
GLUCOSE: 155 mg/dL — AB (ref 65–99)
Potassium: 4 mmol/L (ref 3.5–5.1)
SODIUM: 131 mmol/L — AB (ref 135–145)

## 2015-11-21 LAB — URINALYSIS COMPLETE WITH MICROSCOPIC (ARMC ONLY)
Bilirubin Urine: NEGATIVE
GLUCOSE, UA: NEGATIVE mg/dL
Ketones, ur: NEGATIVE mg/dL
NITRITE: NEGATIVE
Protein, ur: NEGATIVE mg/dL
SPECIFIC GRAVITY, URINE: 1.01 (ref 1.005–1.030)
pH: 6 (ref 5.0–8.0)

## 2015-11-21 LAB — CBC
HCT: 39.1 % (ref 35.0–47.0)
HEMOGLOBIN: 13.1 g/dL (ref 12.0–16.0)
MCH: 30.6 pg (ref 26.0–34.0)
MCHC: 33.5 g/dL (ref 32.0–36.0)
MCV: 91.6 fL (ref 80.0–100.0)
Platelets: 142 10*3/uL — ABNORMAL LOW (ref 150–440)
RBC: 4.27 MIL/uL (ref 3.80–5.20)
RDW: 12.8 % (ref 11.5–14.5)
WBC: 9.7 10*3/uL (ref 3.6–11.0)

## 2015-11-21 MED ORDER — TIOTROPIUM BROMIDE MONOHYDRATE 18 MCG IN CAPS
18.0000 ug | ORAL_CAPSULE | Freq: Every day | RESPIRATORY_TRACT | Status: DC
  Filled 2015-11-21: qty 5

## 2015-11-21 MED ORDER — SPIRONOLACTONE 25 MG PO TABS
50.0000 mg | ORAL_TABLET | Freq: Every day | ORAL | Status: DC
  Administered 2015-11-22: 50 mg via ORAL
  Filled 2015-11-21: qty 2

## 2015-11-21 MED ORDER — GABAPENTIN 300 MG PO CAPS
300.0000 mg | ORAL_CAPSULE | Freq: Four times a day (QID) | ORAL | Status: DC
  Administered 2015-11-21 – 2015-11-22 (×2): 300 mg via ORAL
  Filled 2015-11-21 (×2): qty 1

## 2015-11-21 MED ORDER — RENA-VITE PO TABS
1.0000 | ORAL_TABLET | Freq: Every day | ORAL | Status: DC
  Administered 2015-11-22: 1 via ORAL
  Filled 2015-11-21: qty 1

## 2015-11-21 MED ORDER — PANTOPRAZOLE SODIUM 40 MG PO TBEC
40.0000 mg | DELAYED_RELEASE_TABLET | Freq: Every day | ORAL | Status: DC
  Administered 2015-11-22: 40 mg via ORAL
  Filled 2015-11-21: qty 1

## 2015-11-21 MED ORDER — ASPIRIN EC 325 MG PO TBEC
325.0000 mg | DELAYED_RELEASE_TABLET | Freq: Every day | ORAL | Status: DC
  Administered 2015-11-21 – 2015-11-22 (×2): 325 mg via ORAL
  Filled 2015-11-21 (×2): qty 1

## 2015-11-21 MED ORDER — ACETAMINOPHEN 650 MG RE SUPP: 650.0000 mg | Freq: Four times a day (QID) | RECTAL | Status: DC | PRN

## 2015-11-21 MED ORDER — OXYCODONE HCL 5 MG PO TABS
5.0000 mg | ORAL_TABLET | ORAL | Status: DC | PRN
  Administered 2015-11-21 – 2015-11-22 (×2): 5 mg via ORAL
  Filled 2015-11-21 (×2): qty 1

## 2015-11-21 MED ORDER — MELOXICAM 7.5 MG PO TABS
15.0000 mg | ORAL_TABLET | Freq: Every day | ORAL | Status: DC
  Administered 2015-11-22: 15 mg via ORAL
  Filled 2015-11-21: qty 2
  Filled 2015-11-21 (×2): qty 1

## 2015-11-21 MED ORDER — ONDANSETRON HCL 4 MG/2ML IJ SOLN: 4.0000 mg | Freq: Four times a day (QID) | INTRAMUSCULAR | Status: DC | PRN

## 2015-11-21 MED ORDER — MORPHINE SULFATE (PF) 2 MG/ML IV SOLN: 2.0000 mg | INTRAVENOUS | Status: DC | PRN

## 2015-11-21 MED ORDER — ALBUTEROL SULFATE (2.5 MG/3ML) 0.083% IN NEBU: 2.5000 mg | INHALATION_SOLUTION | RESPIRATORY_TRACT | Status: DC | PRN

## 2015-11-21 MED ORDER — ONDANSETRON HCL 4 MG PO TABS: 4.0000 mg | ORAL_TABLET | Freq: Four times a day (QID) | ORAL | Status: DC | PRN

## 2015-11-21 MED ORDER — ACETAMINOPHEN 325 MG PO TABS: 650.0000 mg | ORAL_TABLET | Freq: Four times a day (QID) | ORAL | Status: DC | PRN

## 2015-11-21 MED ORDER — SODIUM CHLORIDE 0.9% FLUSH
3.0000 mL | Freq: Two times a day (BID) | INTRAVENOUS | Status: DC
  Administered 2015-11-21 – 2015-11-22 (×2): 3 mL via INTRAVENOUS

## 2015-11-21 MED ORDER — ENOXAPARIN SODIUM 40 MG/0.4ML ~~LOC~~ SOLN
40.0000 mg | SUBCUTANEOUS | Status: DC
  Administered 2015-11-21: 40 mg via SUBCUTANEOUS
  Filled 2015-11-21: qty 0.4

## 2015-11-21 MED ORDER — SENNOSIDES-DOCUSATE SODIUM 8.6-50 MG PO TABS: 1.0000 | ORAL_TABLET | Freq: Every evening | ORAL | Status: DC | PRN

## 2015-11-21 MED ORDER — NICOTINE 21 MG/24HR TD PT24
21.0000 mg | MEDICATED_PATCH | Freq: Every day | TRANSDERMAL | Status: DC
  Administered 2015-11-21 – 2015-11-22 (×2): 21 mg via TRANSDERMAL
  Filled 2015-11-21 (×2): qty 1

## 2015-11-21 MED ORDER — MOMETASONE FURO-FORMOTEROL FUM 200-5 MCG/ACT IN AERO
2.0000 | INHALATION_SPRAY | Freq: Two times a day (BID) | RESPIRATORY_TRACT | Status: DC
  Administered 2015-11-21 – 2015-11-22 (×2): 2 via RESPIRATORY_TRACT
  Filled 2015-11-21: qty 8.8

## 2015-11-21 MED ORDER — VITAMIN D 1000 UNITS PO TABS
5000.0000 [IU] | ORAL_TABLET | Freq: Every day | ORAL | Status: DC
  Administered 2015-11-22: 5000 [IU] via ORAL
  Filled 2015-11-21: qty 5

## 2015-11-21 MED ORDER — ATORVASTATIN CALCIUM 20 MG PO TABS
40.0000 mg | ORAL_TABLET | Freq: Every day | ORAL | Status: DC
  Filled 2015-11-21: qty 2

## 2015-11-21 MED ORDER — NADOLOL 40 MG PO TABS
40.0000 mg | ORAL_TABLET | Freq: Every day | ORAL | Status: DC
  Administered 2015-11-22: 40 mg via ORAL
  Filled 2015-11-21 (×3): qty 1

## 2015-11-21 NOTE — ED Notes (Signed)
Pt in via triage; pt states she had an MRI done this morning, received phone call from orthopedics to come into ED due to abnormality found on MRI.  Pt with hx of total hip replacement of right hip in April.  Pt reports having "nerve pain starting at tail bone going back down into both legs" since approximately one week post op.  Pt ambulatory to room with walker.  Pt A/Ox4, vitals WDL, no immediate distress at this time.

## 2015-11-21 NOTE — H&P (Signed)
[facility] - [place] at [facility]   PATIENT NAME: [name]    MR#:  [id number]  DATE OF BIRTH:  05-Jul-1961  DATE OF ADMISSION:  11/21/2015  PRIMARY CARE PHYSICIAN: WHITE, Arlyss Repress, NP   REQUESTING/REFERRING PHYSICIAN: Myrna Blazer, MD  CHIEF COMPLAINT:   Abnormal MRI HISTORY OF PRESENT ILLNESS:  [name]  is a 55 y.o. female with a known history of Essential hypertension, COPD, depression alcohol liver cirrhosis and GERD, has a right hip replacement approximately one month ago and was seen by orthopedics Dr. Rosita Kea 2 and half weeks ago for bilateral lower extremity pain radiating from her buttocks down the back of both legs. Patient had epidural anesthesia during her surgery and was on Lovenox following the surgery. Dr. 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 During my examination patient is resting comfortably. Denies any chest pain or shortness of breath. Denies any nausea vomiting or abdominal pain. Denies any headache or blurry visionand her complaints in the past. On call neurologist has recommended to admit the patient and give aspirin and check lipid panel in a.m.  PAST MEDICAL HISTORY:   Past Medical History  Diagnosis Date  . Hypertension   . COPD (chronic obstructive pulmonary disease) (HCC)   . Shortness of breath dyspnea     with exertion  . Depression   . GERD (gastroesophageal reflux disease)   . Arthritis   . Cirrhosis of liver (HCC)   . Spine pain, lumbar   . Cigarette smoker     PAST SURGICAL HISTOIRY:   Past Surgical History  Procedure Laterality Date  . Tubal ligation    . Cesarean section    . Dilation and curettage of uterus    . Colonoscopy    . Esophagogastroduodenoscopy endoscopy    . Joint replacement    . Total hip arthroplasty Right 10/16/2015     Procedure: TOTAL HIP ARTHROPLASTY ANTERIOR APPROACH;  Surgeon: Kennedy Bucker, MD;  Location: [facility] ORS;  Service: Orthopedics;  Laterality: Right;    SOCIAL HISTORY:   Social History  Substance Use Topics  . Smoking status: Current Every Day Smoker -- 1.00 packs/day    Types: Cigarettes  . Smokeless tobacco: Never Used  . Alcohol Use: No    FAMILY HISTORY:   Family History  Problem Relation Age of Onset  . Breast cancer Mother 18  . Breast cancer Maternal Aunt 60    DRUG ALLERGIES:   Allergies  Allergen Reactions  . Chantix [Varenicline] Other (See Comments)    "streak on tongue"  . Codeine Itching    REVIEW OF SYSTEMS:  CONSTITUTIONAL: No fever, fatigue or weakness.  EYES: No blurred or double vision.  EARS, NOSE, AND THROAT: No tinnitus or ear pain.  RESPIRATORY: No cough, shortness of breath, wheezing or hemoptysis.  CARDIOVASCULAR: No chest pain, orthopnea, edema.  GASTROINTESTINAL: No nausea, vomiting, diarrhea or abdominal pain.  GENITOURINARY: No dysuria, hematuria.  ENDOCRINE: No polyuria, nocturia,  HEMATOLOGY: No anemia, easy bruising or bleeding SKIN: No rash or lesion. MUSCULOSKELETAL: Reporting bilateral lower leg crampy pain with some tingling for 2-3 weeks NEUROLOGIC: No tingling, numbness, weakness.  PSYCHIATRY: No anxiety or depression.   MEDICATIONS AT HOME:   Prior to Admission medications   Medication Sig Start Date End Date Taking? Authorizing Provider  albuterol (PROVENTIL HFA;VENTOLIN  HFA) 108 (90 Base) MCG/ACT inhaler Inhale 2 puffs into the lungs every 4 (four) hours as needed for wheezing or shortness of breath.   Yes Historical Provider, MD  B Complex Vitamins (VITAMIN B COMPLEX) TABS Take 1 tablet by mouth daily.   Yes Historical Provider, MD  Cholecalciferol (VITAMIN D3) 5000 units CAPS Take 5,000 Units by mouth daily.    Yes Historical Provider, MD  Fluticasone-Salmeterol (ADVAIR DISKUS) 250-50 MCG/DOSE AEPB Inhale 1 puff into the lungs  2 (two) times daily.   Yes Historical Provider, MD  gabapentin (NEURONTIN) 300 MG capsule Take 1 capsule (300 mg total) by mouth 4 (four) times daily. 11/11/15 11/10/16 Yes Jonathan D Cuthriell, PA-C  meloxicam (MOBIC) 15 MG tablet Take 15 mg by mouth daily.   Yes Historical Provider, MD  nadolol (CORGARD) 20 MG tablet Take 40 mg by mouth daily.   Yes Historical Provider, MD  oxyCODONE (ROXICODONE) 5 MG immediate release tablet Take 1 tablet (5 mg total) by mouth every 4 (four) hours as needed for severe pain. Patient taking differently: Take 5-10 mg by mouth See admin instructions. 10 mg every morning, 5 mg every evening, and 10 mg at bedtime 11/11/15  Yes Jonathan D Cuthriell, PA-C  pantoprazole (PROTONIX) 40 MG tablet Take 40 mg by mouth daily.   Yes Historical Provider, MD  spironolactone (ALDACTONE) 50 MG tablet Take 50 mg by mouth daily.   Yes Historical Provider, MD  tiotropium (SPIRIVA) 18 MCG inhalation capsule Place 18 mcg into inhaler and inhale daily.   Yes Historical Provider, MD  enoxaparin (LOVENOX) 40 MG/0.4ML injection Inject 0.4 mLs (40 mg total) into the skin daily. X 14 days Patient not taking: Reported on 11/21/2015 10/19/15   Evon Slack, PA-C  oxyCODONE (OXY IR/ROXICODONE) 5 MG immediate release tablet Take 1-2 tablets (5-10 mg total) by mouth every 3 (three) hours as needed for breakthrough pain. Patient not taking: Reported on 11/21/2015 10/19/15   Evon Slack, PA-C      VITAL SIGNS:  Blood pressure 127/72, pulse 62, temperature 98.3 F (36.8 C), temperature source Oral, resp. rate 10, height  (1.6 m), weight 84.369 kg (186 lb), SpO2 95 %.  PHYSICAL EXAMINATION:  GENERAL:  55 y.o.-year-old patient lying in the bed with no acute distress.  EYES: Pupils equal, round, reactive to light and accommodation. No scleral icterus. Extraocular muscles intact.  HEENT: Head atraumatic, normocephalic. Oropharynx and nasopharynx clear.  NECK:  Supple, no jugular venous  distention. No thyroid enlargement, no tenderness.  LUNGS: Normal breath sounds bilaterally, no wheezing, rales,rhonchi or crepitation. No use of accessory muscles of respiration.  CARDIOVASCULAR: S1, S2 normal. No murmurs, rubs, or gallops.  ABDOMEN: Soft, nontender, nondistended. Bowel sounds present. No organomegaly or mass.  EXTREMITIES: Right hip scar is healing well, right lower extremity range of motion is limited from recent surgery. No pedal edema, cyanosis, or clubbing.  NEUROLOGIC: Cranial nerves II through XII are intact. Muscle strength 5/5 in all extremities except right lower extremity strength is 4 out of 5. Sensation intact. Gait not checked. No paraspinal tenderness in the thoracolumbar area PSYCHIATRIC: The patient is alert and oriented x 3.  SKIN: No obvious rash, lesion, or ulcer.   LABORATORY PANEL:   CBC  Recent Labs Lab 11/21/15 1431  WBC 9.7  HGB 13.1  HCT 39.1  PLT 142*   ------------------------------------------------------------------------------------------------------------------  Chemistries   Recent Labs Lab 11/21/15 1431  NA 131*  K 4.0  CL 100*  CO2 22  GLUCOSE 155*  BUN 19  CREATININE 0.98  CALCIUM 9.3   ------------------------------------------------------------------------------------------------------------------  Cardiac Enzymes No results for input(s): TROPONINI in the last 168 hours. ------------------------------------------------------------------------------------------------------------------  RADIOLOGY:  Mr Lumbar Spine Wo Contrast  11/21/2015  CLINICAL DATA:  Right hip replacement 10/16/2015. A week later intense low back pain radiating down the posterior legs with numbness in the feet. EXAM: MRI LUMBAR SPINE WITHOUT CONTRAST TECHNIQUE: Multiplanar, multisequence MR imaging of the lumbar spine was performed. No intravenous contrast was administered. COMPARISON:  None. FINDINGS: Segmentation: Standard. Alignment:  Physiologic. Vertebrae: Remote T12 and L4 superior endplate fractures. No signal abnormality to suggest fracture, discitis, or mass. Conus: Extends to the L2 level and is abnormal with bilateral central cord hyperintensity correlating with the gray matter. There is no associated cord compression. No prominent intrathecal vessels or aortic aneurysm. No bony infarct noted. There is no associated expansion, suggesting nonacute timing. This appearance is usually from completed gray matter infarct. Sequela of polio or similar illness can also have this appearance, but would have a distinct clinical presentation. Paraspinal and retroperitoneal structures: Incidental renal cortical cysts. Disc levels: T12- L1: Mild ventral spurring.  No herniation or impingement L1-L2: Ventral spondylotic spurring and mild disc bulging. No herniation or impingement L2-L3: Ventral spondylotic spurring and mild disc narrowing. No herniation or impingement L3-L4: Disc: Narrowing and circumferential bulging. Facets: Mild ligamentous overgrowth and marginal spurring. Canal: Patent. Foramina: No impingement. L4-L5: Disc: Disc bulging and mild narrowing. Facets: Arthropathy with right-sided effusion. Canal: Patent. Foramina: No impingement. L5-S1: Disc: Narrowing and bulging with a central protrusion. Facets: Negative. Canal: Disc contacts the S1 nerves in the subarticular recesses without compression. Foramina: No impingement. These results were called by telephone at the time of interpretation on 11/21/2015 at 9:12 am to Dr. Kennedy Bucker , who verbally acknowledged these results. IMPRESSION: 1. Abnormal gray matter at the conus, usually related to infarct, as above. No conus expansion as seen with acute timing. 2. Disc and facet degeneration without neural compression. Electronically Signed   By: Marnee Spring M.D.   On: 11/21/2015 09:17    EKG:   Orders placed or performed during the hospital encounter of 10/09/15  . EKG 12-Lead  . EKG  12-Lead    IMPRESSION AND PLAN:  [name]  is a 55 y.o. female with a known history of Essential hypertension, COPD, depression alcohol liver cirrhosis and GERD, has a right hip replacement approximately one month ago and was seen by orthopedics Dr. Rosita Kea 2 and half weeks ago for bilateral lower extremity pain radiating from her buttocks down the back of both legs. Patient had epidural anesthesia during her surgery and was on Lovenox following the surgery. Dr. 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 Echocardiogram Consult neurology discussed with [facility] neurologist Dr. Amada Jupiter  #Chronic history of COPD-not currently exacerbated Provide breathing treatments as needed basis  #History of alcohol liver cirrhosis ASymptomatic at this time,  #Bilateral lower extending to weakness probably from spinal cord infarct Consults orthopedics Dr. Sherrell Puller  #Essential hypertension resume her regular blood pressure medication and titrate as needed  #Tobacco abuse disorder counseled patient to quit smoking for 3-5 minutes.  Provide nicotine patch 21 mg. Patient is agreeable.    All the records are reviewed and case discussed with ED provider. Management plans discussed with the patient, she is  in agreement.  CODE STATUS: fc, husband  TOTAL TIME TAKING CARE OF THIS PATIENT: 45 minutes.    Ramonita LabGouru, Shakelia Scrivner M.D on 11/21/2015 at 6:47 PM  Between 7am to 6pm - Pager - [phone]  After 6pm go to [email or web address] - password [email or web address] [facility]  [facility]  Office  [phone]  CC: Primary care physician; WHITE, Arlyss RepressELIZABETH BURNEY, NP

## 2015-11-21 NOTE — ED Provider Notes (Signed)
[facility] Emergency Department Provider Note   ____________________________________________  Time seen: Approximately 5 PM  I have reviewed the triage vital signs and the nursing notes.   HISTORY  Chief Complaint Abnormal Lab   HPI [name] is a 55 y.o. female with a recent history of a right-sided hip replacement who is presenting with 2-1/2 weeksof tingling lower extremity pain which is bilateral and radiates from her buttocks down the back of both legs. It is not worsened with movement. She is also weak to her right lower extremity. She had a right-sided hip replacement about a month ago. Also had an epidural with this. Says she was on Lovenox when the symptoms started. Was seeing Dr. Trilby Drummer for orthopedics and was thought to have sciatic symptoms but had an MRI done by Dr. Trilby Drummer who found what looked like a spinal infarct in her conus and then centered to the emergency department. The patient is not on any anticoagulants at this time. Says that she recently had her cholesterol checked and it was normal. No loss of bowel or bladder continence.   Past Medical History  Diagnosis Date  . Hypertension   . COPD (chronic obstructive pulmonary disease) (HCC)   . Shortness of breath dyspnea     with exertion  . Depression   . GERD (gastroesophageal reflux disease)   . Arthritis   . Cirrhosis of liver (HCC)   . Spine pain, lumbar   . Cigarette smoker     Patient Active Problem List   Diagnosis Date Noted  . Primary osteoarthritis of right hip 10/16/2015    Past Surgical History  Procedure Laterality Date  . Tubal ligation    . Cesarean section    . Dilation and curettage of uterus    . Colonoscopy    . Esophagogastroduodenoscopy endoscopy    . Joint replacement    . Total hip arthroplasty Right 10/16/2015    Procedure: TOTAL HIP ARTHROPLASTY ANTERIOR APPROACH;  Surgeon: Kennedy Bucker, MD;  Location: [facility] ORS;  Service: Orthopedics;   Laterality: Right;    Current Outpatient Rx  Name  Route  Sig  Dispense  Refill  . albuterol (PROVENTIL HFA;VENTOLIN HFA) 108 (90 Base) MCG/ACT inhaler   Inhalation   Inhale 2 puffs into the lungs every 4 (four) hours as needed for wheezing or shortness of breath.         . B Complex Vitamins (VITAMIN B COMPLEX) TABS   Oral   Take 1 tablet by mouth daily.         . Cholecalciferol (VITAMIN D3) 5000 units CAPS   Oral   Take 1 capsule by mouth daily.         Marland Kitchen enoxaparin (LOVENOX) 40 MG/0.4ML injection   Subcutaneous   Inject 0.4 mLs (40 mg total) into the skin daily. X 14 days   14 Syringe   0   . Fluticasone-Salmeterol (ADVAIR DISKUS) 250-50 MCG/DOSE AEPB   Inhalation   Inhale 1 puff into the lungs 2 (two) times daily.         Marland Kitchen gabapentin (NEURONTIN) 300 MG capsule   Oral   Take 1 capsule (300 mg total) by mouth 4 (four) times daily.   120 capsule   0   . meloxicam (MOBIC) 15 MG tablet   Oral   Take 15 mg by mouth daily.         . nadolol (CORGARD) 20 MG tablet   Oral   Take 40  mg by mouth daily.         . nitrofurantoin, macrocrystal-monohydrate, (MACROBID) 100 MG capsule   Oral   Take 100 mg by mouth 2 (two) times daily.         Marland Kitchen oxyCODONE (OXY IR/ROXICODONE) 5 MG immediate release tablet   Oral   Take 1-2 tablets (5-10 mg total) by mouth every 3 (three) hours as needed for breakthrough pain.   60 tablet   0   . oxyCODONE (ROXICODONE) 5 MG immediate release tablet   Oral   Take 1 tablet (5 mg total) by mouth every 4 (four) hours as needed for severe pain.   8 tablet   0   . pantoprazole (PROTONIX) 40 MG tablet   Oral   Take 40 mg by mouth daily.         Marland Kitchen spironolactone (ALDACTONE) 50 MG tablet   Oral   Take 50 mg by mouth daily.         Marland Kitchen tiotropium (SPIRIVA) 18 MCG inhalation capsule   Inhalation   Place 18 mcg into inhaler and inhale daily.           Allergies Chantix and Codeine  Family History  Problem Relation  Age of Onset  . Breast cancer Mother 68  . Breast cancer Maternal Aunt 41    Social History Social History  Substance Use Topics  . Smoking status: Current Every Day Smoker -- 1.00 packs/day    Types: Cigarettes  . Smokeless tobacco: Never Used  . Alcohol Use: No    Review of Systems Constitutional: No fever/chills Eyes: No visual changes. ENT: No sore throat. Cardiovascular: Denies chest pain. Respiratory: Denies shortness of breath. Gastrointestinal: No abdominal pain.  No nausea, no vomiting.  No diarrhea.  No constipation. Genitourinary: Negative for dysuria. Musculoskeletal: Negative for back pain. Skin: Negative for rash. Neurological: Negative for headaches  10-point ROS otherwise negative.  ____________________________________________   PHYSICAL EXAM:  VITAL SIGNS: ED Triage Vitals  Enc Vitals Group     BP 11/21/15 1429 137/78 mmHg     Pulse Rate 11/21/15 1429 85     Resp 11/21/15 1429 18     Temp 11/21/15 1429 98.3 F (36.8 C)     Temp Source 11/21/15 1429 Oral     SpO2 11/21/15 1429 97 %     Weight 11/21/15 1429 186 lb (84.369 kg)     Height 11/21/15 1429 5\' 3"  (1.6 m)     Head Cir --      Peak Flow --      Pain Score 11/21/15 1430 2     Pain Loc --      Pain Edu? --      Excl. in GC? --     Constitutional: Alert and oriented. Well appearing and in no acute distress. Eyes: Conjunctivae are normal. PERRL. EOMI. Head: Atraumatic. Nose: No congestion/rhinnorhea. Mouth/Throat: Mucous membranes are moist.   Neck: No stridor.   Cardiovascular: Normal rate, regular rhythm. Grossly normal heart sounds.   Respiratory: Normal respiratory effort.  No retractions. Lungs CTAB. Gastrointestinal: Soft and nontender. No distention. No abdominal bruits. No CVA tenderness. Musculoskeletal: No lower extremity tenderness nor edema.  No joint effusions. Neurologic:  Normal speech and language. Right lower extremity with weak 4-5 strength. No saddle anesthesia. No  tenderness to the thoracic or lumbar spines. Sensation is intact to light touch. No other focal deficits identified. Skin:  Skin is warm, dry and intact. No rash noted. Psychiatric: Mood  and affect are normal. Speech and behavior are normal.  ____________________________________________   LABS (all labs ordered are listed, but only abnormal results are displayed)  Labs Reviewed  BASIC METABOLIC PANEL - Abnormal; Notable for the following:    Sodium 131 (*)    Chloride 100 (*)    Glucose, Bld 155 (*)    All other components within normal limits  CBC - Abnormal; Notable for the following:    Platelets 142 (*)    All other components within normal limits  URINALYSIS COMPLETEWITH MICROSCOPIC ([facility] ONLY)   ____________________________________________  EKG   ____________________________________________  RADIOLOGY   CLINICAL DATA: Right hip replacement 10/16/2015. A week later intense low back pain radiating down the posterior legs with numbness in the feet.  EXAM: MRI LUMBAR SPINE WITHOUT CONTRAST  TECHNIQUE: Multiplanar, multisequence MR imaging of the lumbar spine was performed. No intravenous contrast was administered.  COMPARISON: None.  FINDINGS: Segmentation: Standard.  Alignment: Physiologic.  Vertebrae: Remote T12 and L4 superior endplate fractures. No signal abnormality to suggest fracture, discitis, or mass.  Conus: Extends to the L2 level and is abnormal with bilateral central cord hyperintensity correlating with the gray matter. There is no associated cord compression. No prominent intrathecal vessels or aortic aneurysm. No bony infarct noted. There is no associated expansion, suggesting nonacute timing. This appearance is usually from completed gray matter infarct. Sequela of polio or similar illness can also have this appearance, but would have a distinct clinical presentation.  Paraspinal and retroperitoneal structures: Incidental renal  cortical cysts.  Disc levels:  T12- L1:  Mild ventral spurring. No herniation or impingement  L1-L2:  Ventral spondylotic spurring and mild disc bulging. No herniation or impingement  L2-L3:  Ventral spondylotic spurring and mild disc narrowing. No herniation or impingement  L3-L4:  Disc: Narrowing and circumferential bulging.  Facets: Mild ligamentous overgrowth and marginal spurring.  Canal: Patent.  Foramina: No impingement.  L4-L5:  Disc: Disc bulging and mild narrowing.  Facets: Arthropathy with right-sided effusion.  Canal: Patent.  Foramina: No impingement.  L5-S1:  Disc: Narrowing and bulging with a central protrusion.  Facets: Negative.  Canal: Disc contacts the S1 nerves in the subarticular recesses without compression.  Foramina: No impingement.  These results were called by telephone at the time of interpretation on 11/21/2015 at 9:12 am to Dr. Kennedy Bucker , who verbally acknowledged these results.  IMPRESSION: 1. Abnormal gray matter at the conus, usually related to infarct, as above. No conus expansion as seen with acute timing. 2. Disc and facet degeneration without neural compression.   Electronically Signed  By: Marnee Spring M.D.  On: 11/21/2015 09:17     ____________________________________________   PROCEDURES    ____________________________________________   INITIAL IMPRESSION / ASSESSMENT AND PLAN / ED COURSE  Pertinent labs & imaging results that were available during my care of the patient were reviewed by me and considered in my medical decision making (see chart for details).  ----------------------------------------- 5:20 PM on 11/21/2015 -----------------------------------------  I discussed the case with Dr. Thad Ranger of neurology who recommends full stroke workup. I will start patient on aspirin. Dr. signed out to Dr. Allena Katz for admission to the  hospital. ____________________________________________   FINAL CLINICAL IMPRESSION(S) / ED DIAGNOSES  Spinal cord stroke.    NEW MEDICATIONS STARTED DURING THIS VISIT:  New Prescriptions   No medications on file     Note:  This document was prepared using Dragon voice recognition software and may include unintentional dictation errors.  Myrna Blazeravid Matthew Holston Oyama, MD 11/21/15 [id number]

## 2015-11-21 NOTE — ED Notes (Signed)
States she had an MRI done this AM and was sent to Ed for evaluation of "stroke prevention", states she had a right hip replacement April 4th, states right nerve pain in her leg

## 2015-11-22 ENCOUNTER — Inpatient Hospital Stay
Admit: 2015-11-22 | Discharge: 2015-11-22 | Disposition: A | Discharge status: Home / Self Care | Payer: Medicare Other | Attending: Internal Medicine | Admitting: Internal Medicine

## 2015-11-22 DIAGNOSIS — G9511 Acute infarction of spinal cord (embolic) (nonembolic): Principal | ICD-10-CM

## 2015-11-22 LAB — CBC
HCT: 39.4 % (ref 35.0–47.0)
HEMOGLOBIN: 13.4 g/dL (ref 12.0–16.0)
MCH: 31.2 pg (ref 26.0–34.0)
MCHC: 34 g/dL (ref 32.0–36.0)
MCV: 91.7 fL (ref 80.0–100.0)
PLATELETS: 138 10*3/uL — AB (ref 150–440)
RBC: 4.3 MIL/uL (ref 3.80–5.20)
RDW: 12.7 % (ref 11.5–14.5)
WBC: 7.8 10*3/uL (ref 3.6–11.0)

## 2015-11-22 LAB — BASIC METABOLIC PANEL
ANION GAP: 7 (ref 5–15)
BUN: 14 mg/dL (ref 6–20)
CALCIUM: 9.5 mg/dL (ref 8.9–10.3)
CO2: 27 mmol/L (ref 22–32)
CREATININE: 0.67 mg/dL (ref 0.44–1.00)
Chloride: 101 mmol/L (ref 101–111)
Glucose, Bld: 97 mg/dL (ref 65–99)
Potassium: 3.9 mmol/L (ref 3.5–5.1)
SODIUM: 135 mmol/L (ref 135–145)

## 2015-11-22 LAB — LIPID PANEL
Cholesterol: 194 mg/dL (ref 0–200)
HDL: 38 mg/dL — ABNORMAL LOW (ref >40)
LDL CALC: 125 mg/dL — AB (ref 0–99)
Total CHOL/HDL Ratio: 5.1 RATIO
Triglycerides: 156 mg/dL — ABNORMAL HIGH (ref <150)
VLDL: 31 mg/dL (ref 0–40)

## 2015-11-22 LAB — HEMOGLOBIN A1C: HEMOGLOBIN A1C: 4.8 % (ref 4.0–6.0)

## 2015-11-22 LAB — ECHOCARDIOGRAM COMPLETE
HEIGHTINCHES: 63 in
WEIGHTICAEL: 2976 [oz_av]

## 2015-11-22 LAB — TSH: TSH: 1.187 u[IU]/mL (ref 0.350–4.500)

## 2015-11-22 LAB — SEDIMENTATION RATE: SED RATE: 42 mm/h — AB (ref 0–30)

## 2015-11-22 LAB — C-REACTIVE PROTEIN: CRP: 0.5 mg/dL (ref <1.0)

## 2015-11-22 MED ORDER — ASPIRIN 325 MG PO TBEC: 325.0000 mg | DELAYED_RELEASE_TABLET | Freq: Every day | ORAL | Status: DC

## 2015-11-22 MED ORDER — ATORVASTATIN CALCIUM 40 MG PO TABS: 40.0000 mg | ORAL_TABLET | Freq: Every day | ORAL | Status: DC

## 2015-11-22 NOTE — Progress Notes (Signed)
Physical Therapy Evaluation Patient Details Name: [name] MRN: [id number] DOB: 1960/09/12 Today's Date: 11/22/2015   History of Present Illness  [name] is a 55 y.o. female with a known history of Essential hypertension, COPD, depression alcohol liver cirrhosis and GERD, has a right hip replacement approximately one month ago and was seen by orthopedics Dr. Rudene Christians 2 and half weeks ago for bilateral lower extremity pain radiating from her buttocks down the back of both legs. Patient had epidural anesthesia during her surgery and was on Lovenox following the surgery. Dr. 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 During my examination patient is resting comfortably. Denies any chest pain or shortness of breath. Denies any nausea vomiting or abdominal pain. Denies any headache or blurry visionand her complaints in the past. On call neurologist has recommended to admit the patient and give aspirin and check lipid panel in a.m.  Clinical Impression  Pt presents to PT at baseline functional mobility and strength after anterior THA on 10/16/15.  Pt admitted for B LE pain which has improved with pain medication.  Pt is independent with bed mobility and transfers using rollator.  Pt had finished HHPT and waiting to start outpatient PT.  Instructed pt to continue with home exercises, to continue to maintain anterior hip precautions, to add gentle low back stretch for pain relief, and to focus on upright posture with gait.  Pt verbalized understanding of all instructions.  No further PT needs at this time.    Follow Up Recommendations Outpatient PT    Equipment Recommendations  None recommended by PT (has rollator)    Recommendations for Other Services       Precautions / Restrictions Precautions Precautions: Anterior Hip Restrictions Weight Bearing  Restrictions: No      Mobility  Bed Mobility Overal bed mobility: Independent                Transfers Overall transfer level: Independent Equipment used: Scientist, clinical (histocompatibility and immunogenetics) transfer comment: Good safety with rollator, locking wheels prior to standing, good body mechanics and no balance deviations  Ambulation/Gait Ambulation/Gait assistance: Modified independent (Device/Increase time) Ambulation Distance (Feet): 200 Feet Assistive device: 4-wheeled walker Gait Pattern/deviations: Step-through pattern;Antalgic     General Gait Details: forward trunk flexion with moderate heavy lean on RW, verbal cues for upright posture  Stairs            Wheelchair Mobility    Modified Rankin (Stroke Patients Only)       Balance Overall balance assessment: Modified Independent                                           Pertinent Vitals/Pain Pain Assessment: 0-10 Pain Location: Pain controlled currently but reports worse pain in mornings in B buttocks down back of legs to knees, feels like strings pulling    Home Living Family/patient expects to be discharged to:: Private residence Living Arrangements: Spouse/significant other Available Help at Discharge: Family Type of Home: House Home Access: Stairs to enter Entrance Stairs-Rails: None Entrance Stairs-Number of Steps: 1 Home Layout: One level        Prior Function Level of Independence: Independent with  assistive device(s)         Comments: Using rollator at home, beginning transitioning to cane with PT     Hand Dominance   Dominant Hand: Right    Extremity/Trunk Assessment   Upper Extremity Assessment: Overall WFL for tasks assessed           Lower Extremity Assessment: Overall WFL for tasks assessed         Communication   Communication: No difficulties  Cognition Arousal/Alertness: Awake/alert Behavior During Therapy: WFL for tasks  assessed/performed Overall Cognitive Status: Within Functional Limits for tasks assessed                      General Comments General comments (skin integrity, edema, etc.): anterior hip replacement incision intact    Exercises Other Exercises Other Exercises: Pt demonstrated supine bed exercises to B LE's; instructed pt on gentle lumbar stretch in supine, knees to side alternating R<>L      Assessment/Plan    PT Assessment All further PT needs can be met in the next venue of care  PT Diagnosis Difficulty walking;Acute pain   PT Problem List Decreased mobility;Decreased strength;Decreased range of motion;Pain  PT Treatment Interventions     PT Goals (Current goals can be found in the Care Plan section) Acute Rehab PT Goals Patient Stated Goal: To go home. PT Goal Formulation: With patient Time For Goal Achievement: 11/29/15 Potential to Achieve Goals: Good    Frequency     Barriers to discharge        Co-evaluation               End of Session   Activity Tolerance: Patient tolerated treatment well Patient left: in bed;with call bell/phone within reach Nurse Communication: Mobility status         Time: 6815-9470 PT Time Calculation (min) (ACUTE ONLY): 20 min   Charges:   PT Evaluation $PT Eval Low Complexity: 1 Procedure PT Treatments $Therapeutic Exercise: 8-22 mins   PT G Codes:        Randa Riss A Anders Hohmann, PT 11/22/2015, 11:51 AM

## 2015-11-22 NOTE — Plan of Care (Signed)
Problem: Education: Goal: Knowledge of [facility] Education information/materials will improve Outcome: Not Progressing Education material provided to Staff.   Problem: Health Behavior/Discharge Planning: Goal: Ability to manage health-related needs will improve Outcome: Progressing Educated patient about the importance [name]

## 2015-11-22 NOTE — Progress Notes (Signed)
*  PRELIMINARY RESULTS* Echocardiogram 2D Echocardiogram has been performed.  Danielle HousekeeperJerry R [name] 11/22/2015, 8:27 AM

## 2015-11-22 NOTE — Discharge Summary (Signed)
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 Hypertension   . COPD (chronic obstructive pulmonary disease) (HCC)   . Shortness of breath dyspnea     with exertion  . Depression   . GERD (gastroesophageal reflux disease)   . Arthritis   . Cirrhosis of liver (HCC)   . Spine pain, lumbar   . Cigarette smoker     HOSPITAL COURSE:  [name]  is a 55 y.o. female admitted 11/21/2015 with chief complaint Abnormal Lab . Please see H&P performed by Ramonita Lab, MD for further information. She presented to the hospital at the insistence of her outpatient doctors base of MRI findings concerning for spinal cord infarct. She was complaining of some lower leg paresthesias and pain. She is evaluated by neurology during her stay who feels that the infarct is likely subacute-a few weeks old. She's been assessed for risk factor modification found have LDL cholesterol greater than 100 been started on aspirin and statin therapy. Discharged in stable condition  DISCHARGE CONDITIONS:   Improved/stable  CONSULTS OBTAINED:  Treatment Team:  Kennedy Bucker, MD Kym Groom, MD Thana Farr, MD  DRUG ALLERGIES:   Allergies  Allergen Reactions  . Chantix [Varenicline] Other (See Comments)    "streak on tongue"  . Codeine Itching    DISCHARGE MEDICATIONS:   Current Discharge Medication List    START taking these medications   Details  aspirin EC 325 MG EC tablet Take 1 tablet (325 mg total) by mouth daily. Qty: 30 tablet, Refills: 0      atorvastatin (LIPITOR) 40 MG tablet Take 1 tablet (40 mg total) by mouth daily at 6 PM. Qty: 30 tablet, Refills: 0      CONTINUE these medications which have NOT CHANGED   Details  albuterol (PROVENTIL HFA;VENTOLIN HFA) 108 (90 Base) MCG/ACT inhaler Inhale 2 puffs into the lungs every 4 (four) hours as needed for wheezing or shortness of breath.    B Complex Vitamins (VITAMIN B COMPLEX) TABS Take 1 tablet by mouth daily.    Cholecalciferol (VITAMIN D3) 5000 units CAPS Take 5,000 Units by mouth daily.     Fluticasone-Salmeterol (ADVAIR DISKUS) 250-50 MCG/DOSE AEPB Inhale 1 puff into the lungs 2 (two) times daily.    gabapentin (NEURONTIN) 300 MG capsule Take 1 capsule (300 mg total) by mouth 4 (four) times daily. Qty: 120 capsule, Refills: 0    meloxicam (MOBIC) 15 MG tablet Take 15 mg by mouth daily.    nadolol (CORGARD) 20 MG tablet Take 40 mg by mouth daily.    !! oxyCODONE (ROXICODONE) 5 MG immediate release tablet Take 1 tablet (5 mg total) by mouth every 4 (four) hours as needed for severe pain. Qty: 8 tablet, Refills: 0    pantoprazole (PROTONIX) 40 MG tablet Take 40 mg by mouth daily.    spironolactone (ALDACTONE) 50 MG tablet Take 50 mg by mouth daily.    tiotropium (SPIRIVA) 18 MCG inhalation capsule Place 18 mcg into inhaler and inhale daily.    !! oxyCODONE (OXY  IR/ROXICODONE) 5 MG immediate release tablet Take 1-2 tablets (5-10 mg total) by mouth every 3 (three) hours as needed for breakthrough pain. Qty: 60 tablet, Refills: 0     !! - Potential duplicate medications found. Please discuss with provider.    STOP taking these medications     enoxaparin (LOVENOX) 40 MG/0.4ML injection          DISCHARGE INSTRUCTIONS:    DIET:  Regular diet  DISCHARGE CONDITION:  Stable  ACTIVITY:  Activity as tolerated  OXYGEN:  Home Oxygen: No.   Oxygen Delivery: room air  DISCHARGE LOCATION:  home   If you experience worsening of your admission symptoms,  develop shortness of breath, life threatening emergency, suicidal or homicidal thoughts you must seek medical attention immediately by calling 911 or calling your MD immediately  if symptoms less severe.  You Must read complete instructions/literature along with all the possible adverse reactions/side effects for all the Medicines you take and that have been prescribed to you. Take any new Medicines after you have completely understood and accpet all the possible adverse reactions/side effects.   Please note  You were cared for by a hospitalist during your hospital stay. If you have any questions about your discharge medications or the care you received while you were in the hospital after you are discharged, you can call the unit and asked to speak with the hospitalist on call if the hospitalist that took care of you is not available. Once you are discharged, your primary care physician will handle any further medical issues. Please note that NO REFILLS for any discharge medications will be authorized once you are discharged, as it is imperative that you return to your primary care physician (or establish a relationship with a primary care physician if you do not have one) for your aftercare needs so that they can reassess your need for medications and monitor your lab values.    On the day of Discharge:   VITAL SIGNS:  Blood pressure 130/50, pulse 63, temperature 97.8 F (36.6 C), temperature source Oral, resp. rate 16, height 5\' 3"  (1.6 m), weight 84.369 kg (186 lb), SpO2 96 %.  I/O:  No intake or output data in the 24 hours ending 11/22/15 1237  PHYSICAL EXAMINATION:  GENERAL:  55 y.o.-year-old patient lying in the bed with no acute distress.  EYES: Pupils equal, round, reactive to light and accommodation. No scleral icterus. Extraocular muscles intact.  HEENT: Head atraumatic, normocephalic. Oropharynx and nasopharynx clear.  NECK:  Supple, no jugular venous distention. No thyroid  enlargement, no tenderness.  LUNGS: Normal breath sounds bilaterally, no wheezing, rales,rhonchi or crepitation. No use of accessory muscles of respiration.  CARDIOVASCULAR: S1, S2 normal. No murmurs, rubs, or gallops.  ABDOMEN: Soft, non-tender, non-distended. Bowel sounds present. No organomegaly or mass.  EXTREMITIES: No pedal edema, cyanosis, or clubbing.  NEUROLOGIC: Cranial nerves II through XII are intact. Muscle strength 5/5 in all extremities. Sensation intact. Gait not checked.  PSYCHIATRIC: The patient is alert and oriented x 3.  SKIN: No obvious rash, lesion, or ulcer.   DATA REVIEW:   CBC  Recent Labs Lab 11/22/15 0428  WBC 7.8  HGB 13.4  HCT 39.4  PLT 138*    Chemistries   Recent Labs Lab 11/22/15 0428  NA 135  K 3.9  CL 101  CO2 27  GLUCOSE 97  BUN 14  CREATININE 0.67  CALCIUM 9.5    Cardiac Enzymes No results for input(s): TROPONINI in the  last 168 hours.  Microbiology Results  Results for orders placed or performed during the hospital encounter of 10/09/15  Surgical pcr screen     Status: None   Collection Time: 10/09/15  1:24 PM  Result Value Ref Range Status   MRSA, PCR NEGATIVE NEGATIVE Final   Staphylococcus aureus NEGATIVE NEGATIVE Final    Comment:        The Xpert SA Assay (FDA approved for NASAL specimens in patients over 54 years of age), is one component of a comprehensive surveillance program.  Test performance has been validated by [facility] for patients greater than or equal to 54 year old. It is not intended to diagnose infection nor to guide or monitor treatment.   Urine culture     Status: None   Collection Time: 10/09/15  1:24 PM  Result Value Ref Range Status   Specimen Description URINE, CLEAN CATCH  Final   Special Requests NONE  Final   Culture >=100,000 COLONIES/mL ESCHERICHIA COLI  Final   Report Status 10/11/2015 FINAL  Final   Organism ID, Bacteria ESCHERICHIA COLI  Final      Susceptibility   Escherichia  coli - MIC*    AMPICILLIN 4 SENSITIVE Sensitive     CEFAZOLIN <=4 SENSITIVE Sensitive     CEFTRIAXONE <=1 SENSITIVE Sensitive     CIPROFLOXACIN <=0.25 SENSITIVE Sensitive     GENTAMICIN <=1 SENSITIVE Sensitive     IMIPENEM <=0.25 SENSITIVE Sensitive     NITROFURANTOIN <=16 SENSITIVE Sensitive     TRIMETH/SULFA <=20 SENSITIVE Sensitive     AMPICILLIN/SULBACTAM <=2 SENSITIVE Sensitive     PIP/TAZO <=4 SENSITIVE Sensitive     Extended ESBL NEGATIVE Sensitive     * >=100,000 COLONIES/mL ESCHERICHIA COLI    RADIOLOGY:  Mr Lumbar Spine Wo Contrast  11/21/2015  CLINICAL DATA:  Right hip replacement 10/16/2015. A week later intense low back pain radiating down the posterior legs with numbness in the feet. EXAM: MRI LUMBAR SPINE WITHOUT CONTRAST TECHNIQUE: Multiplanar, multisequence MR imaging of the lumbar spine was performed. No intravenous contrast was administered. COMPARISON:  None. FINDINGS: Segmentation: Standard. Alignment: Physiologic. Vertebrae: Remote T12 and L4 superior endplate fractures. No signal abnormality to suggest fracture, discitis, or mass. Conus: Extends to the L2 level and is abnormal with bilateral central cord hyperintensity correlating with the gray matter. There is no associated cord compression. No prominent intrathecal vessels or aortic aneurysm. No bony infarct noted. There is no associated expansion, suggesting nonacute timing. This appearance is usually from completed gray matter infarct. Sequela of polio or similar illness can also have this appearance, but would have a distinct clinical presentation. Paraspinal and retroperitoneal structures: Incidental renal cortical cysts. Disc levels: T12- L1: Mild ventral spurring.  No herniation or impingement L1-L2: Ventral spondylotic spurring and mild disc bulging. No herniation or impingement L2-L3: Ventral spondylotic spurring and mild disc narrowing. No herniation or impingement L3-L4: Disc: Narrowing and circumferential bulging.  Facets: Mild ligamentous overgrowth and marginal spurring. Canal: Patent. Foramina: No impingement. L4-L5: Disc: Disc bulging and mild narrowing. Facets: Arthropathy with right-sided effusion. Canal: Patent. Foramina: No impingement. L5-S1: Disc: Narrowing and bulging with a central protrusion. Facets: Negative. Canal: Disc contacts the S1 nerves in the subarticular recesses without compression. Foramina: No impingement. These results were called by telephone at the time of interpretation on 11/21/2015 at 9:12 am to Dr. Kennedy BuckerMICHAEL MENZ , who verbally acknowledged these results. IMPRESSION: 1. Abnormal gray matter at the conus, usually related to infarct, as above.  No conus expansion as seen with acute timing. 2. Disc and facet degeneration without neural compression. Electronically Signed   By: Marnee Spring M.D.   On: 11/21/2015 09:17     Management plans discussed with the patient, family and they are in agreement.  CODE STATUS:     Code Status Orders        Start     Ordered   11/21/15 2038  Full code   Continuous     11/21/15 2038    Code Status History    Date Active Date Inactive Code Status Order ID Comments User Context   This patient has a current code status but no historical code status.      TOTAL TIME TAKING CARE OF THIS PATIENT: 28 minutes.    Waleska Buttery,  Mardi Mainland.D on 11/22/2015 at 12:37 PM  Between 7am to 6pm - Pager - [phone]  After 6pm go to [email or web address] - Scientist, research (life sciences) [facility]  Office  [phone]  CC: Primary care physician; WHITE, Arlyss Repress, NP

## 2015-11-22 NOTE — Consult Note (Signed)
Referring Physician: Hower    Chief Complaint: Back and BLE pain  HPI: [name] is an 55 y.o. female who underwent hip replacement surgery on 4/4.  About a week after discharge began to experience severe pain in her coccygeal region that spread across her back and down the back of both legs.  This pain did not respond to pain medications.  Patient borrowed a TENS unit which seemed to provide the most relief.  Over the course of the next few weeks her pain did improve some.  Work up included a MRI of the lumbar spine that was performed on yesterday.  MRI revealed a spinal infarct and patient was admitted for further evaluation.  Patient does not describe any weakness or numbness in her lower extremities.  Reports no bowel or bladder incontinence.  Initial NIHSS of 0.    Date last known well: Unable to determine Time last known well: Unable to determine tPA Given: No: Outside time window, unable to determine LKW  Past Medical History  Diagnosis Date  . Hypertension   . COPD (chronic obstructive pulmonary disease) ([place])   . Shortness of breath dyspnea     with exertion  . Depression   . GERD (gastroesophageal reflux disease)   . Arthritis   . Cirrhosis of liver ([place])   . Spine pain, lumbar   . Cigarette smoker     Past Surgical History  Procedure Laterality Date  . Tubal ligation    . Cesarean section    . Dilation and curettage of uterus    . Colonoscopy    . Esophagogastroduodenoscopy endoscopy    . Joint replacement    . Total hip arthroplasty Right 10/16/2015    Procedure: TOTAL HIP ARTHROPLASTY ANTERIOR APPROACH;  Surgeon: Hessie Knows, MD;  Location: [facility] ORS;  Service: Orthopedics;  Laterality: Right;    Family History  Problem Relation Age of Onset  . Breast cancer Mother 72  . Breast cancer Maternal Aunt 60   Social History:  reports that she has been smoking Cigarettes.  She has been smoking about 1.00 pack per day. She has never used smokeless tobacco. She  reports that she does not drink alcohol or use illicit drugs.  Allergies:  Allergies  Allergen Reactions  . Chantix [Varenicline] Other (See Comments)    "streak on tongue"  . Codeine Itching    Medications:  I have reviewed the patient's current medications. Prior to Admission:  Prescriptions prior to admission  Medication Sig Dispense Refill Last Dose  . albuterol (PROVENTIL HFA;VENTOLIN HFA) 108 (90 Base) MCG/ACT inhaler Inhale 2 puffs into the lungs every 4 (four) hours as needed for wheezing or shortness of breath.   PRN  . B Complex Vitamins (VITAMIN B COMPLEX) TABS Take 1 tablet by mouth daily.   11/21/2015 at am  . Cholecalciferol (VITAMIN D3) 5000 units CAPS Take 5,000 Units by mouth daily.    11/21/2015 at am  . Fluticasone-Salmeterol (ADVAIR DISKUS) 250-50 MCG/DOSE AEPB Inhale 1 puff into the lungs 2 (two) times daily.   11/21/2015 at am  . gabapentin (NEURONTIN) 300 MG capsule Take 1 capsule (300 mg total) by mouth 4 (four) times daily. 120 capsule 0 11/21/2015 at am  . meloxicam (MOBIC) 15 MG tablet Take 15 mg by mouth daily.   11/21/2015 at am  . nadolol (CORGARD) 20 MG tablet Take 40 mg by mouth daily.   11/21/2015 at 0630  . oxyCODONE (ROXICODONE) 5 MG immediate release tablet Take 1 tablet (  5 mg total) by mouth every 4 (four) hours as needed for severe pain. (Patient taking differently: Take 5-10 mg by mouth See admin instructions. 10 mg every morning, 5 mg every evening, and 10 mg at bedtime) 8 tablet 0 11/21/2015 at am  . pantoprazole (PROTONIX) 40 MG tablet Take 40 mg by mouth daily.   11/21/2015 at am  . spironolactone (ALDACTONE) 50 MG tablet Take 50 mg by mouth daily.   11/21/2015 at am  . tiotropium (SPIRIVA) 18 MCG inhalation capsule Place 18 mcg into inhaler and inhale daily.   11/21/2015 at am  . enoxaparin (LOVENOX) 40 MG/0.4ML injection Inject 0.4 mLs (40 mg total) into the skin daily. X 14 days (Patient not taking: Reported on 11/21/2015) 14 Syringe 0   . oxyCODONE (OXY  IR/ROXICODONE) 5 MG immediate release tablet Take 1-2 tablets (5-10 mg total) by mouth every 3 (three) hours as needed for breakthrough pain. (Patient not taking: Reported on 11/21/2015) 60 tablet 0    Scheduled: . aspirin EC  325 mg Oral Daily  . atorvastatin  40 mg Oral q1800  . cholecalciferol  5,000 Units Oral Daily  . enoxaparin (LOVENOX) injection  40 mg Subcutaneous Q24H  . gabapentin  300 mg Oral QID  . meloxicam  15 mg Oral Daily  . mometasone-formoterol  2 puff Inhalation BID  . multivitamin  1 tablet Oral Daily  . nadolol  40 mg Oral Daily  . nicotine  21 mg Transdermal Daily  . pantoprazole  40 mg Oral Daily  . sodium chloride flush  3 mL Intravenous Q12H  . spironolactone  50 mg Oral Daily  . tiotropium  18 mcg Inhalation Daily    ROS: History obtained from the patient  General ROS: negative for - chills, fatigue, fever, night sweats, weight gain or weight loss Psychological ROS: negative for - behavioral disorder, hallucinations, memory difficulties, mood swings or suicidal ideation Ophthalmic ROS: negative for - blurry vision, double vision, eye pain or loss of vision ENT ROS: negative for - epistaxis, nasal discharge, oral lesions, sore throat, tinnitus or vertigo Allergy and Immunology ROS: negative for - hives or itchy/watery eyes Hematological and Lymphatic ROS: negative for - bleeding problems, bruising or swollen lymph nodes Endocrine ROS: negative for - galactorrhea, hair pattern changes, polydipsia/polyuria or temperature intolerance Respiratory ROS: negative for - cough, hemoptysis, shortness of breath or wheezing Cardiovascular ROS: negative for - chest pain, dyspnea on exertion, edema or irregular heartbeat Gastrointestinal ROS: negative for - abdominal pain, diarrhea, hematemesis, nausea/vomiting or stool incontinence Genito-Urinary ROS: negative for - dysuria, hematuria, incontinence or urinary frequency/urgency Musculoskeletal ROS: right hip  pain Neurological ROS: as noted in HPI Dermatological ROS: negative for rash and skin lesion changes  Physical Examination: Blood pressure 130/50, pulse 63, temperature 97.8 F (36.6 C), temperature source Oral, resp. rate 16, height '5\' 3"'$  (1.6 m), weight 84.369 kg (186 lb), SpO2 96 %.  HEENT-  Normocephalic, no lesions, without obvious abnormality.  Normal external eye and conjunctiva.  Normal TM's bilaterally.  Normal auditory canals and external ears. Normal external nose, mucus membranes and septum.  Normal pharynx. Cardiovascular- S1, S2 normal, pulses palpable throughout   Lungs- chest clear, no wheezing, rales, normal symmetric air entry Abdomen- soft, non-tender; bowel sounds normal; no masses,  no organomegaly Extremities- no edema Lymph-no adenopathy palpable Musculoskeletal-no joint tenderness, deformity or swelling Skin-warm and dry, no hyperpigmentation, vitiligo, or suspicious lesions  Neurological Examination Mental Status: Alert, oriented, thought content appropriate.  Speech fluent without  evidence of aphasia.  Able to follow 3 step commands without difficulty. Cranial Nerves: II: Discs flat bilaterally; Visual fields grossly normal, pupils equal, round, reactive to light and accommodation III,IV, VI: ptosis not present, extra-ocular motions intact bilaterally V,VII: smile symmetric, facial light touch sensation normal bilaterally VIII: hearing normal bilaterally IX,X: gag reflex present XI: bilateral shoulder shrug XII: midline tongue extension Motor: Right : Upper extremity   5/5    Left:     Upper extremity   5/5  Lower extremity   5/5     Lower extremity   5/5 Tone and bulk:normal tone throughout; no atrophy noted Sensory: Pinprick and light touch intact throughout, bilaterally.  Vibratory sensation and temperature sensation intact in the lower extremities.   Deep Tendon Reflexes: 2+ and symmetric with absent AJ's bilaterally Plantars: Right: downgoing   Left:  downgoing Cerebellar: Normal finger-to-nose and normal heel-to-shin testing bilaterally Gait: not tested due to safety concerns    Laboratory Studies:  Basic Metabolic Panel:  Recent Labs Lab 11/21/15 1431 11/22/15 0428  NA 131* 135  K 4.0 3.9  CL 100* 101  CO2 22 27  GLUCOSE 155* 97  BUN 19 14  CREATININE 0.98 0.67  CALCIUM 9.3 9.5    Liver Function Tests: No results for input(s): AST, ALT, ALKPHOS, BILITOT, PROT, ALBUMIN in the last 168 hours. No results for input(s): LIPASE, AMYLASE in the last 168 hours. No results for input(s): AMMONIA in the last 168 hours.  CBC:  Recent Labs Lab 11/21/15 1431 11/22/15 0428  WBC 9.7 7.8  HGB 13.1 13.4  HCT 39.1 39.4  MCV 91.6 91.7  PLT 142* 138*    Cardiac Enzymes: No results for input(s): CKTOTAL, CKMB, CKMBINDEX, TROPONINI in the last 168 hours.  BNP: Invalid input(s): POCBNP  CBG: No results for input(s): GLUCAP in the last 168 hours.  Microbiology: Results for orders placed or performed during the hospital encounter of 10/09/15  Surgical pcr screen     Status: None   Collection Time: 10/09/15  1:24 PM  Result Value Ref Range Status   MRSA, PCR NEGATIVE NEGATIVE Final   Staphylococcus aureus NEGATIVE NEGATIVE Final    Comment:        The Xpert SA Assay (FDA approved for NASAL specimens in patients over 8 years of age), is one component of a comprehensive surveillance program.  Test performance has been validated by [facility] for patients greater than or equal to 15 year old. It is not intended to diagnose infection nor to guide or monitor treatment.   Urine culture     Status: None   Collection Time: 10/09/15  1:24 PM  Result Value Ref Range Status   Specimen Description URINE, CLEAN CATCH  Final   Special Requests NONE  Final   Culture >=100,000 COLONIES/mL ESCHERICHIA COLI  Final   Report Status 10/11/2015 FINAL  Final   Organism ID, Bacteria ESCHERICHIA COLI  Final      Susceptibility    Escherichia coli - MIC*    AMPICILLIN 4 SENSITIVE Sensitive     CEFAZOLIN <=4 SENSITIVE Sensitive     CEFTRIAXONE <=1 SENSITIVE Sensitive     CIPROFLOXACIN <=0.25 SENSITIVE Sensitive     GENTAMICIN <=1 SENSITIVE Sensitive     IMIPENEM <=0.25 SENSITIVE Sensitive     NITROFURANTOIN <=16 SENSITIVE Sensitive     TRIMETH/SULFA <=20 SENSITIVE Sensitive     AMPICILLIN/SULBACTAM <=2 SENSITIVE Sensitive     PIP/TAZO <=4 SENSITIVE Sensitive     Extended  ESBL NEGATIVE Sensitive     * >=100,000 COLONIES/mL ESCHERICHIA COLI    Coagulation Studies: No results for input(s): LABPROT, INR in the last 72 hours.  Urinalysis:  Recent Labs Lab 11/21/15 1644  COLORURINE YELLOW*  LABSPEC 1.010  PHURINE 6.0  GLUCOSEU NEGATIVE  HGBUR 1+*  BILIRUBINUR NEGATIVE  KETONESUR NEGATIVE  PROTEINUR NEGATIVE  NITRITE NEGATIVE  LEUKOCYTESUR TRACE*    Lipid Panel:    Component Value Date/Time   CHOL 194 11/22/2015 0428   TRIG 156* 11/22/2015 0428   HDL 38* 11/22/2015 0428   CHOLHDL 5.1 11/22/2015 0428   VLDL 31 11/22/2015 0428   LDLCALC 125* 11/22/2015 0428    HgbA1C: No results found for: HGBA1C  Urine Drug Screen:  No results found for: LABOPIA, COCAINSCRNUR, LABBENZ, AMPHETMU, THCU, LABBARB  Alcohol Level: No results for input(s): ETH in the last 168 hours.   Imaging: Mr Lumbar Spine Wo Contrast  11/21/2015  CLINICAL DATA:  Right hip replacement 10/16/2015. A week later intense low back pain radiating down the posterior legs with numbness in the feet. EXAM: MRI LUMBAR SPINE WITHOUT CONTRAST TECHNIQUE: Multiplanar, multisequence MR imaging of the lumbar spine was performed. No intravenous contrast was administered. COMPARISON:  None. FINDINGS: Segmentation: Standard. Alignment: Physiologic. Vertebrae: Remote T12 and L4 superior endplate fractures. No signal abnormality to suggest fracture, discitis, or mass. Conus: Extends to the L2 level and is abnormal with bilateral central cord hyperintensity  correlating with the gray matter. There is no associated cord compression. No prominent intrathecal vessels or aortic aneurysm. No bony infarct noted. There is no associated expansion, suggesting nonacute timing. This appearance is usually from completed gray matter infarct. Sequela of polio or similar illness can also have this appearance, but would have a distinct clinical presentation. Paraspinal and retroperitoneal structures: Incidental renal cortical cysts. Disc levels: T12- L1: Mild ventral spurring.  No herniation or impingement L1-L2: Ventral spondylotic spurring and mild disc bulging. No herniation or impingement L2-L3: Ventral spondylotic spurring and mild disc narrowing. No herniation or impingement L3-L4: Disc: Narrowing and circumferential bulging. Facets: Mild ligamentous overgrowth and marginal spurring. Canal: Patent. Foramina: No impingement. L4-L5: Disc: Disc bulging and mild narrowing. Facets: Arthropathy with right-sided effusion. Canal: Patent. Foramina: No impingement. L5-S1: Disc: Narrowing and bulging with a central protrusion. Facets: Negative. Canal: Disc contacts the S1 nerves in the subarticular recesses without compression. Foramina: No impingement. These results were called by telephone at the time of interpretation on 11/21/2015 at 9:12 am to Dr. Hessie Knows , who verbally acknowledged these results. IMPRESSION: 1. Abnormal gray matter at the conus, usually related to infarct, as above. No conus expansion as seen with acute timing. 2. Disc and facet degeneration without neural compression. Electronically Signed   By: Monte Fantasia M.D.   On: 11/21/2015 09:17    Assessment: 55 y.o. female presenting with back and lower extremity pain.  MRI of the lumbar spine personally reviewed and shows abnormal signal in the conus gray matter.  Ischemia is suspected as the etiology.  Patient asymptomatic other than pain which is improving.  Patient with vascular risk factors and recent surgery.   Will rule out inflammatory cause as well.   Echocardiogram and A1c pending.  LDL 125.    Stroke Risk Factors - hypertension and smoking  Plan: 1. ASA '81mg'$  daily 2. Statin initiation with target LDL < 70. 3. ESR, ANA, CRP, Double stranded anti DNA antibody, sjogren's antibodies.  4. Patient to follow up with neurology on an outpatient basis.  Alexis Goodell, MD Neurology [phone] 11/22/2015, 12:25 PM

## 2015-11-22 NOTE — Discharge Instructions (Signed)
Follow up lab work as outpatient: ANA Anti-ds-DNA CRP ESR Sjogrens A,B antibodies

## 2015-11-23 LAB — ANA W/REFLEX IF POSITIVE: ANA: NEGATIVE

## 2015-11-23 LAB — SJOGRENS SYNDROME-A EXTRACTABLE NUCLEAR ANTIBODY

## 2015-11-23 LAB — ANTI-DNA ANTIBODY, DOUBLE-STRANDED: ds DNA Ab: 2 IU/mL (ref 0–9)

## 2015-11-23 LAB — SJOGRENS SYNDROME-B EXTRACTABLE NUCLEAR ANTIBODY: SSB (La) (ENA) Antibody, IgG: <0.2 AI (ref 0.0–0.9)

## 2015-12-07 ENCOUNTER — Other Ambulatory Visit: Payer: Self-pay | Admitting: Neurology

## 2015-12-07 DIAGNOSIS — M79605 Pain in left leg: Secondary | ICD-10-CM

## 2015-12-07 DIAGNOSIS — M79604 Pain in right leg: Secondary | ICD-10-CM

## 2015-12-07 DIAGNOSIS — G9581 Conus medullaris syndrome: Secondary | ICD-10-CM

## 2015-12-07 DIAGNOSIS — R937 Abnormal findings on diagnostic imaging of other parts of musculoskeletal system: Secondary | ICD-10-CM

## 2016-01-02 ENCOUNTER — Ambulatory Visit: Payer: Medicare Other

## 2016-01-08 ENCOUNTER — Other Ambulatory Visit: Payer: Self-pay | Admitting: Family Medicine

## 2016-01-08 DIAGNOSIS — Z1231 Encounter for screening mammogram for malignant neoplasm of breast: Secondary | ICD-10-CM

## 2016-01-29 ENCOUNTER — Ambulatory Visit
Admission: RE | Admit: 2016-01-29 | Discharge: 2016-01-29 | Disposition: A | Discharge status: Home / Self Care | Payer: Medicare Other | Source: Ambulatory Visit | Attending: Neurology | Admitting: Neurology

## 2016-01-29 DIAGNOSIS — M50222 Other cervical disc displacement at C5-C6 level: Secondary | ICD-10-CM | POA: Diagnosis not present

## 2016-01-29 DIAGNOSIS — R937 Abnormal findings on diagnostic imaging of other parts of musculoskeletal system: Secondary | ICD-10-CM | POA: Diagnosis present

## 2016-01-29 DIAGNOSIS — M79604 Pain in right leg: Secondary | ICD-10-CM | POA: Insufficient documentation

## 2016-01-29 DIAGNOSIS — M79605 Pain in left leg: Secondary | ICD-10-CM | POA: Insufficient documentation

## 2016-01-29 DIAGNOSIS — M2578 Osteophyte, vertebrae: Secondary | ICD-10-CM | POA: Insufficient documentation

## 2016-01-29 DIAGNOSIS — G9581 Conus medullaris syndrome: Secondary | ICD-10-CM

## 2016-01-29 MED ORDER — GADOBENATE DIMEGLUMINE 529 MG/ML IV SOLN
20.0000 mL | Freq: Once | INTRAVENOUS | Status: AC | PRN
  Administered 2016-01-29: 17 mL via INTRAVENOUS

## 2016-01-31 ENCOUNTER — Other Ambulatory Visit: Payer: Self-pay | Admitting: Family Medicine

## 2016-01-31 ENCOUNTER — Ambulatory Visit
Admission: RE | Admit: 2016-01-31 | Discharge: 2016-01-31 | Disposition: A | Discharge status: Home / Self Care | Payer: Medicare Other | Source: Ambulatory Visit | Attending: Family Medicine | Admitting: Family Medicine

## 2016-01-31 DIAGNOSIS — R928 Other abnormal and inconclusive findings on diagnostic imaging of breast: Secondary | ICD-10-CM | POA: Diagnosis not present

## 2016-01-31 DIAGNOSIS — Z1231 Encounter for screening mammogram for malignant neoplasm of breast: Secondary | ICD-10-CM | POA: Insufficient documentation

## 2016-02-05 ENCOUNTER — Other Ambulatory Visit: Payer: Self-pay | Admitting: Family Medicine

## 2016-02-05 DIAGNOSIS — N6310 Unspecified lump in the right breast, unspecified quadrant: Secondary | ICD-10-CM

## 2016-02-14 ENCOUNTER — Ambulatory Visit
Admission: RE | Admit: 2016-02-14 | Discharge: 2016-02-14 | Disposition: A | Discharge status: Home / Self Care | Payer: Medicare Other | Source: Ambulatory Visit | Attending: Family Medicine | Admitting: Family Medicine

## 2016-02-14 DIAGNOSIS — R921 Mammographic calcification found on diagnostic imaging of breast: Secondary | ICD-10-CM | POA: Diagnosis not present

## 2016-02-14 DIAGNOSIS — N63 Unspecified lump in breast: Secondary | ICD-10-CM | POA: Diagnosis present

## 2016-02-14 DIAGNOSIS — N6310 Unspecified lump in the right breast, unspecified quadrant: Secondary | ICD-10-CM

## 2016-02-14 DIAGNOSIS — N6001 Solitary cyst of right breast: Secondary | ICD-10-CM | POA: Insufficient documentation

## 2016-03-12 DIAGNOSIS — M47812 Spondylosis without myelopathy or radiculopathy, cervical region: Secondary | ICD-10-CM | POA: Insufficient documentation

## 2016-03-12 DIAGNOSIS — IMO0002 Reserved for concepts with insufficient information to code with codable children: Secondary | ICD-10-CM | POA: Insufficient documentation

## 2016-05-19 DIAGNOSIS — Z720 Tobacco use: Secondary | ICD-10-CM

## 2016-05-19 HISTORY — DX: Tobacco use: Z72.0

## 2016-08-20 ENCOUNTER — Other Ambulatory Visit: Payer: Self-pay | Admitting: Family Medicine

## 2016-08-20 DIAGNOSIS — R928 Other abnormal and inconclusive findings on diagnostic imaging of breast: Secondary | ICD-10-CM

## 2016-09-05 ENCOUNTER — Ambulatory Visit
Admission: RE | Admit: 2016-09-05 | Discharge: 2016-09-05 | Disposition: A | Discharge status: Home / Self Care | Payer: Medicare Other | Source: Ambulatory Visit | Attending: Family Medicine | Admitting: Family Medicine

## 2016-09-05 DIAGNOSIS — R928 Other abnormal and inconclusive findings on diagnostic imaging of breast: Secondary | ICD-10-CM | POA: Insufficient documentation

## 2016-09-08 ENCOUNTER — Other Ambulatory Visit: Payer: Self-pay | Admitting: Gastroenterology

## 2016-09-08 DIAGNOSIS — R140 Abdominal distension (gaseous): Secondary | ICD-10-CM

## 2016-09-08 DIAGNOSIS — K7030 Alcoholic cirrhosis of liver without ascites: Secondary | ICD-10-CM

## 2016-09-09 ENCOUNTER — Other Ambulatory Visit: Payer: Self-pay | Admitting: Family Medicine

## 2016-09-09 DIAGNOSIS — R928 Other abnormal and inconclusive findings on diagnostic imaging of breast: Secondary | ICD-10-CM

## 2016-09-09 DIAGNOSIS — R921 Mammographic calcification found on diagnostic imaging of breast: Secondary | ICD-10-CM

## 2016-09-11 ENCOUNTER — Ambulatory Visit
Admission: RE | Admit: 2016-09-11 | Discharge: 2016-09-11 | Disposition: A | Discharge status: Home / Self Care | Payer: Medicare Other | Source: Ambulatory Visit | Attending: Family Medicine | Admitting: Family Medicine

## 2016-09-11 DIAGNOSIS — R921 Mammographic calcification found on diagnostic imaging of breast: Secondary | ICD-10-CM

## 2016-09-11 DIAGNOSIS — N6002 Solitary cyst of left breast: Secondary | ICD-10-CM | POA: Insufficient documentation

## 2016-09-11 DIAGNOSIS — N6082 Other benign mammary dysplasias of left breast: Secondary | ICD-10-CM | POA: Insufficient documentation

## 2016-09-11 DIAGNOSIS — R928 Other abnormal and inconclusive findings on diagnostic imaging of breast: Secondary | ICD-10-CM | POA: Diagnosis present

## 2016-09-11 DIAGNOSIS — R920 Mammographic microcalcification found on diagnostic imaging of breast: Secondary | ICD-10-CM | POA: Insufficient documentation

## 2016-09-11 HISTORY — PX: BREAST BIOPSY: SHX20

## 2016-09-12 LAB — SURGICAL PATHOLOGY

## 2016-09-15 ENCOUNTER — Ambulatory Visit
Admission: RE | Admit: 2016-09-15 | Discharge: 2016-09-15 | Disposition: A | Discharge status: Home / Self Care | Payer: Medicare Other | Source: Ambulatory Visit | Attending: Gastroenterology | Admitting: Gastroenterology

## 2016-09-15 DIAGNOSIS — K8020 Calculus of gallbladder without cholecystitis without obstruction: Secondary | ICD-10-CM | POA: Diagnosis not present

## 2016-09-15 DIAGNOSIS — K7030 Alcoholic cirrhosis of liver without ascites: Secondary | ICD-10-CM | POA: Diagnosis present

## 2016-09-15 DIAGNOSIS — R140 Abdominal distension (gaseous): Secondary | ICD-10-CM

## 2016-09-19 DIAGNOSIS — K7460 Unspecified cirrhosis of liver: Secondary | ICD-10-CM | POA: Insufficient documentation

## 2016-09-19 DIAGNOSIS — M1990 Unspecified osteoarthritis, unspecified site: Secondary | ICD-10-CM | POA: Insufficient documentation

## 2016-09-19 DIAGNOSIS — J449 Chronic obstructive pulmonary disease, unspecified: Secondary | ICD-10-CM | POA: Insufficient documentation

## 2016-09-19 HISTORY — DX: Unspecified cirrhosis of liver: K74.60

## 2016-09-29 ENCOUNTER — Encounter: Payer: Self-pay | Admitting: General Surgery

## 2016-09-29 ENCOUNTER — Ambulatory Visit (INDEPENDENT_AMBULATORY_CARE_PROVIDER_SITE_OTHER): Payer: Medicare Other | Admitting: General Surgery

## 2016-09-29 VITALS — BP 127/81 | HR 90 | Temp 99.0°F | Ht 63.0 in | Wt 187.8 lb

## 2016-09-29 DIAGNOSIS — K7030 Alcoholic cirrhosis of liver without ascites: Secondary | ICD-10-CM

## 2016-09-29 DIAGNOSIS — N6099 Unspecified benign mammary dysplasia of unspecified breast: Secondary | ICD-10-CM

## 2016-09-29 NOTE — Progress Notes (Signed)
Patient ID: [name], female   DOB: [birth year], 56 y.o.   MRN: [id number]  CC: Right Breast Lesion  HPI [name] is a 56 y.o. female who presents to clinic for evaluation of a right breast lesion. The lesion was found on her screening mammogram and a stereotactic biopsy was performed which showed atypical lobular hyperplasia. Patient reports she's never had a biopsy before and never had any breast problems before. She had her menses at 56 years old, she had 2 pregnancies but one child. She did not do any breast feeding. She did take birth control for 2 years. She is currently in menopause for the last 14 years. She has a family history of breast cancer in her mother and her maternal aunt. She could not feel any lumps, she's had no nipple discharge, no skin changes, no breast pain, she does not do monthly breast exams. She is here to discuss what to do next from her biopsy. She states that she has had intermittent fevers and chills recently as well as being diagnosed with a recent urinary tract infection with dysuria. She has a chronic cough secondary to COPD but denies any current shortness of breath. She does have difficulty ambulating secondary to complications from a hip replacement performed last year. She is currently in physical therapy for that hip replacement. She is been being followed by GI back in the clinic but states she has not had consistent follow-up and has not been seen in over a year. She does have a known diagnosis of cirrhosis and is currently on lactulose. She has had loose stools secondary to the lactulose use.  HPI  Past Medical History:  Diagnosis Date  . 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 Arthritis   . Cigarette smoker   . Cirrhosis ([facility]) 09/19/2016  . Cirrhosis of liver ([facility])   . COPD (chronic obstructive pulmonary disease) ([facility])   . Depression   . GERD (gastroesophageal reflux disease)   . Hypertension   . Primary osteoarthritis of right hip  10/16/2015  . Shortness of breath dyspnea    with exertion  . 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 Spine pain, lumbar   . Tobacco abuse 05/19/2016    Past Surgical History:  Procedure Laterality Date  . BREAST BIOPSY Right 09/11/2016   Affirm Biopsy- Path pending  . CESAREAN SECTION  1992   x1  . COLONOSCOPY    . DILATION AND CURETTAGE OF UTERUS    . ESOPHAGOGASTRODUODENOSCOPY ENDOSCOPY    . JOINT REPLACEMENT Right 10/2015   Hip  . TOTAL HIP ARTHROPLASTY Right 10/16/2015   Procedure: TOTAL HIP ARTHROPLASTY ANTERIOR APPROACH;  Surgeon: Kennedy BuckerMichael Menz, MD;  Location: [facility] ORS;  Service: Orthopedics;  Laterality: Right;  . TUBAL LIGATION      Family History  Problem Relation Age of Onset  . Breast cancer Mother 3740  . Breast cancer Maternal Aunt 8160    Social History Social History  Substance Use Topics  . Smoking status: Former Smoker    Packs/day: 1.00    Types: Cigarettes    Quit date: 07/28/2016  . Smokeless tobacco: Never Used     Comment: Vaping- Daily (Current)  . Alcohol use No     Comment: Former Heavy ETOH Use    Allergies  Allergen Reactions  . Chantix [Varenicline] Other (See Comments)    Pt stated that this Medication caused her tongue to "have a red streak" Per pt so she D/C this  medication "streak on tongue"    Current Outpatient Prescriptions  Medication Sig Dispense Refill  . albuterol (PROVENTIL HFA;VENTOLIN HFA) 108 (90 Base) MCG/ACT inhaler Inhale 2 puffs into the lungs every 4 (four) hours as needed for wheezing or shortness of breath.    Marland Kitchen aspirin EC 325 MG EC tablet Take 1 tablet (325 mg total) by mouth daily. 30 tablet 0  . atorvastatin (LIPITOR) 40 MG tablet Take 1 tablet (40 mg total) by mouth daily at 6 PM. 30 tablet 0  . B Complex Vitamins (VITAMIN B COMPLEX) TABS Take 1 tablet by mouth daily.    Marland Kitchen buPROPion (WELLBUTRIN SR) 100 MG 12 hr tablet Take 100 mg by mouth 2 (two) times daily.  2  . Cholecalciferol (VITAMIN D3) 5000 units CAPS Take  5,000 Units by mouth daily.     . Fluticasone-Salmeterol (ADVAIR DISKUS) 250-50 MCG/DOSE AEPB Inhale 1 puff into the lungs 2 (two) times daily.    . Ipratropium-Albuterol (COMBIVENT RESPIMAT) 20-100 MCG/ACT AERS respimat Inhale 1 puff into the lungs every 6 (six) hours as needed for wheezing.    Marland Kitchen lisinopril (PRINIVIL,ZESTRIL) 5 MG tablet Take 5 mg by mouth daily.  2  . meloxicam (MOBIC) 15 MG tablet Take 15 mg by mouth daily.    . nadolol (CORGARD) 20 MG tablet Take 40 mg by mouth daily.    . pantoprazole (PROTONIX) 40 MG tablet Take 40 mg by mouth daily.     No current facility-administered medications for this visit.      Review of Systems A Multi-point review of systems was asked and was negative except for the findings documented in the history of present illness  Physical Exam Blood pressure 127/81, pulse 90, temperature 99 F (37.2 C), temperature source Oral, height 5\' 3"  (1.6 m), weight 85.2 kg (187 lb 12.8 oz), last menstrual period 09/30/2002. CONSTITUTIONAL: No acute distress. EYES: Pupils are equal, round, and reactive to light, Sclera are non-icteric. EARS, NOSE, MOUTH AND THROAT: The oropharynx is clear. The oral mucosa is pink and moist. Hearing is intact to voice. LYMPH NODES:  Lymph nodes in the neck are swollen and tender bilaterally in the anterior cervical chain. No palpable lymphadenopathy to the clavicular or axillary areas BREAST: Bilateral breast exam today, no palpable dominant masses in either breast. Previous biopsy site well healed and appropriately tender to palpation. RESPIRATORY:  Lungs are distant but clear. There is normal respiratory effort, with equal breath sounds bilaterally, and without pathologic use of accessory muscles. CARDIOVASCULAR: Heart is regular without murmurs, gallops, or rubs. GI: The abdomen is soft, nontender, and nondistended. There are no palpable masses. There is no hepatosplenomegaly. There are normal bowel sounds in all  quadrants. GU: Rectal deferred.   MUSCULOSKELETAL: Normal muscle strength and tone. No cyanosis or edema.   SKIN: Turgor is good and there are no pathologic skin lesions or ulcers. NEUROLOGIC: Motor and sensation is grossly normal. Cranial nerves are grossly intact. PSYCH:  Oriented to person, place and time. Affect is normal.  Data Reviewed Images and labs reviewed. Mammogram shows an area of density/architectural distortion with punctate calcifications in the middle third of the outer right breast. Labs result shows complex sclerosing lesion with focal atypical lobular hyperplasia and ductal hyperplasia. Her most recent labs show anemia of 11.2 over 33.9. Chemistry panel shows mild decrease in sodium of 135, mild increase in AST of 42, alkaline phosphatase 144, an ammonia level of 103, PT of 12.3, INR of 1.0. I have personally reviewed  the patient's imaging, laboratory findings and medical records.    Assessment    Right breast atypical lobular hyperplasia    Plan    56 year old female with multiple medical problems and a new diagnosis of right breast atypical lobular hyperplasia. Discussed with the patient at length that we do not have a diagnosis of cancer however the atypical hyperplasia is sometimes near a cancer meaning surgical excision of the area of concern is indicated. Given her multiple other medical problems and her lack of current follow-up discuss it would be appropriate for her to be seen in a tertiary care center. She'll be set up with appointments and referrals for [facility] as well as [facility] surgery for evaluation. All questions answered to the patient's satisfaction.     Time spent with the patient was 45 minutes, with more than 50% of the time spent in face-to-face education, counseling and care coordination.     Ricarda Frame, MD FACS General Surgeon 09/29/2016, 2:40 PM

## 2016-09-29 NOTE — Patient Instructions (Signed)
[facility] as well as General Surgery in regards to your breast lesion that is abnormal. I will call you with these appointments as soon as I have them available. If you have not heard from anyone by 10/10/16, please call our office so that we may check on the status of this.

## 2016-10-15 ENCOUNTER — Telehealth: Payer: Self-pay

## 2016-10-15 NOTE — Telephone Encounter (Signed)
I have faxed a referral to [facility] Surgery  Phone #: [phone] Fax #: [phone] & received a confirmation.   I have faxed a referral to [facility] Liver Team/GI Phone #: [phone]  Fax #: [phone] & received a confirmation.    I will follow up within 3-5 days to make sure the appointments have been scheduled.

## 2016-10-23 NOTE — Telephone Encounter (Signed)
I have left a voicemail to both [facility] General Surgery and [facility] Liver Team/GI to inquire wether an appointment has been scheduled yet.    I have also faxed a document to both locations asking to receive a fax back/ update on the referral progress and received confirmation of receipt.

## 2016-10-27 NOTE — Telephone Encounter (Signed)
Patient has an appointment with Surgical Oncology May 7th with Dr. Dellis Anes.   [facility] Liver Team has received the referral and is waiting for the patient to call them to schedule the appointment.   I have called the patient and left them a voicemail to call me to get the direct number that they need to schedule their appointment. That phone number is [phone].

## 2016-10-29 NOTE — Telephone Encounter (Signed)
An appointment has been scheduled with GI Medicine on July 18 with Dr. Woodfin Ganja.   GI Medicine Scheduling  P: [phone] x1 F: [phone]

## 2016-12-01 ENCOUNTER — Encounter
Admission: RE | Admit: 2016-12-01 | Discharge: 2016-12-01 | Disposition: A | Discharge status: Home / Self Care | Payer: Medicare Other | Source: Ambulatory Visit | Attending: Orthopedic Surgery | Admitting: Orthopedic Surgery

## 2016-12-01 DIAGNOSIS — Z79899 Other long term (current) drug therapy: Secondary | ICD-10-CM | POA: Diagnosis not present

## 2016-12-01 DIAGNOSIS — L7632 Postprocedural hematoma of skin and subcutaneous tissue following other procedure: Secondary | ICD-10-CM | POA: Diagnosis not present

## 2016-12-01 DIAGNOSIS — F17210 Nicotine dependence, cigarettes, uncomplicated: Secondary | ICD-10-CM | POA: Diagnosis not present

## 2016-12-01 DIAGNOSIS — Z96641 Presence of right artificial hip joint: Secondary | ICD-10-CM | POA: Diagnosis not present

## 2016-12-01 DIAGNOSIS — Z7951 Long term (current) use of inhaled steroids: Secondary | ICD-10-CM | POA: Diagnosis not present

## 2016-12-01 DIAGNOSIS — L02415 Cutaneous abscess of right lower limb: Secondary | ICD-10-CM | POA: Diagnosis not present

## 2016-12-01 DIAGNOSIS — Z791 Long term (current) use of non-steroidal anti-inflammatories (NSAID): Secondary | ICD-10-CM | POA: Diagnosis not present

## 2016-12-01 DIAGNOSIS — K219 Gastro-esophageal reflux disease without esophagitis: Secondary | ICD-10-CM | POA: Diagnosis not present

## 2016-12-01 DIAGNOSIS — J449 Chronic obstructive pulmonary disease, unspecified: Secondary | ICD-10-CM | POA: Diagnosis not present

## 2016-12-01 DIAGNOSIS — Z885 Allergy status to narcotic agent status: Secondary | ICD-10-CM | POA: Diagnosis not present

## 2016-12-01 DIAGNOSIS — I10 Essential (primary) hypertension: Secondary | ICD-10-CM | POA: Diagnosis not present

## 2016-12-01 HISTORY — DX: Anxiety disorder, unspecified: F41.9

## 2016-12-01 HISTORY — DX: Anemia, unspecified: D64.9

## 2016-12-01 LAB — DIFFERENTIAL
BASOS PCT: 1 %
Basophils Absolute: 0 10*3/uL (ref 0–0.1)
Eosinophils Absolute: 0.1 10*3/uL (ref 0–0.7)
Eosinophils Relative: 1 %
Lymphocytes Relative: 18 %
Lymphs Abs: 1.1 10*3/uL (ref 1.0–3.6)
MONO ABS: 0.6 10*3/uL (ref 0.2–0.9)
Monocytes Relative: 9 %
NEUTROS ABS: 4.3 10*3/uL (ref 1.4–6.5)
Neutrophils Relative %: 71 %

## 2016-12-01 LAB — CBC
HCT: 34.2 % — ABNORMAL LOW (ref 35.0–47.0)
Hemoglobin: 11.5 g/dL — ABNORMAL LOW (ref 12.0–16.0)
MCH: 28.5 pg (ref 26.0–34.0)
MCHC: 33.7 g/dL (ref 32.0–36.0)
MCV: 84.4 fL (ref 80.0–100.0)
PLATELETS: 149 10*3/uL — AB (ref 150–440)
RBC: 4.05 MIL/uL (ref 3.80–5.20)
RDW: 14.5 % (ref 11.5–14.5)
WBC: 6 10*3/uL (ref 3.6–11.0)

## 2016-12-01 NOTE — Patient Instructions (Signed)
Your procedure is scheduled on: Dec 04, 2016 (Thursday) Report to Same Day Surgery 2nd floor medical mall [facility] Entrance-take elevator on left to 2nd floor.  Check in with surgery information desk.) To find out your arrival time please call [phone] between 1PM - 3PM on Dec 03, 2016 (Wednesday)   Remember: Instructions that are not followed completely may result in serious medical risk, up to and including death, or upon the discretion of your surgeon and anesthesiologist your surgery may need to be rescheduled.    _x___ 1. Do not eat food or drink liquids after midnight. No gum chewing or  hard candies                              __x__ 2. No Alcohol for 24 hours before or after surgery.   __x__3. No Smoking for 24 prior to surgery.   ____  4. Bring all medications with you on the day of surgery if instructed.    __x__ 5. Notify your doctor if there is any change in your medical condition     (cold, fever, infections).     Do not wear jewelry, make-up, hairpins, clips or nail polish.  Do not wear lotions, powders, or perfumes. You may wear deodorant.  Do not shave 48 hours prior to surgery. Men may shave face and neck.  Do not bring valuables to the hospital.    [facility] is not responsible for any belongings or valuables.               Contacts, dentures or bridgework may not be worn into surgery.  Leave your suitcase in the car. After surgery it may be brought to your room.  For patients admitted to the hospital, discharge time is determined by your  treatment team                       Patients discharged the day of surgery will not be allowed to drive home.  You will need someone to drive you home and stay with you the night of your procedure.    Please read over the following fact sheets that you were given:   [facility] Preparing for Surgery and or MRSA Information   _x___ Take anti-hypertensive (unless it includes a diuretic), cardiac, seizure, asthma,      anti-reflux and psychiatric medicines with a sip of water. These include:  1. ATORVASTATIN  2. LISINOPRIL  3. NADOLOL  4. PANTOPRAZOLE (PANTOPRAZOLE AT BEDTIME ON WEDNESDAY NIGHT MAY 23 )    ____Fleets enema or Magnesium Citrate as directed.   _x___ Use CHG Soap or sage wipes as directed on instruction sheet   _X___ Use inhalers on the day of surgery and bring to hospital day of surgery  (USE ADVAIR AND COMBIVENT INHALERS THE MORNING OF SURGERY AND BRING TO HOSPITAL)  ____ Stop Metformin and Janumet 2 days prior to surgery.    ____ Take 1/2 of usual insulin dose the night before surgery and none on the morning     surgery.   _x___ Follow recommendations from Cardiologist, Pulmonologist or PCP regarding          stopping Aspirin, Coumadin, Pllavix ,Eliquis, Effient, or Pradaxa, and Pletal.  X____Stop Anti-inflammatories such as Advil, Aleve, Ibuprofen, Motrin, Naproxen, Naprosyn, Goodies powders or aspirin products. OK to take Tylenol  ( STOP MELOXICAM NOW)  _x___ Stop supplements until after surgery.  But may continue Vitamin D, Vitamin B, and multivitamin.    ____ Bring C-Pap to the hospital.

## 2016-12-02 ENCOUNTER — Other Ambulatory Visit: Payer: Medicare Other

## 2016-12-03 MED ORDER — CEFAZOLIN SODIUM-DEXTROSE 2-4 GM/100ML-% IV SOLN
2.0000 g | Freq: Once | INTRAVENOUS | Status: AC
  Administered 2016-12-04: 2 g via INTRAVENOUS

## 2016-12-04 ENCOUNTER — Encounter
Admission: RE | Disposition: A | Discharge status: Home / Self Care | Payer: Self-pay | Source: Ambulatory Visit | Attending: Orthopedic Surgery

## 2016-12-04 ENCOUNTER — Encounter: Payer: Self-pay | Admitting: *Deleted

## 2016-12-04 ENCOUNTER — Ambulatory Visit
Admission: RE | Admit: 2016-12-04 | Discharge: 2016-12-05 | Disposition: A | Discharge status: Home / Self Care | Payer: Medicare Other | Source: Ambulatory Visit | Attending: Orthopedic Surgery | Admitting: Orthopedic Surgery

## 2016-12-04 ENCOUNTER — Ambulatory Visit: Payer: Medicare Other | Admitting: Anesthesiology

## 2016-12-04 DIAGNOSIS — Z791 Long term (current) use of non-steroidal anti-inflammatories (NSAID): Secondary | ICD-10-CM | POA: Insufficient documentation

## 2016-12-04 DIAGNOSIS — F17210 Nicotine dependence, cigarettes, uncomplicated: Secondary | ICD-10-CM | POA: Insufficient documentation

## 2016-12-04 DIAGNOSIS — L7632 Postprocedural hematoma of skin and subcutaneous tissue following other procedure: Secondary | ICD-10-CM | POA: Diagnosis not present

## 2016-12-04 DIAGNOSIS — Z79899 Other long term (current) drug therapy: Secondary | ICD-10-CM | POA: Insufficient documentation

## 2016-12-04 DIAGNOSIS — Z885 Allergy status to narcotic agent status: Secondary | ICD-10-CM | POA: Insufficient documentation

## 2016-12-04 DIAGNOSIS — Z96641 Presence of right artificial hip joint: Secondary | ICD-10-CM | POA: Insufficient documentation

## 2016-12-04 DIAGNOSIS — J449 Chronic obstructive pulmonary disease, unspecified: Secondary | ICD-10-CM | POA: Insufficient documentation

## 2016-12-04 DIAGNOSIS — Z7951 Long term (current) use of inhaled steroids: Secondary | ICD-10-CM | POA: Insufficient documentation

## 2016-12-04 DIAGNOSIS — K219 Gastro-esophageal reflux disease without esophagitis: Secondary | ICD-10-CM | POA: Insufficient documentation

## 2016-12-04 DIAGNOSIS — I10 Essential (primary) hypertension: Secondary | ICD-10-CM | POA: Insufficient documentation

## 2016-12-04 DIAGNOSIS — L02415 Cutaneous abscess of right lower limb: Secondary | ICD-10-CM | POA: Diagnosis present

## 2016-12-04 HISTORY — PX: INCISION AND DRAINAGE HIP: SHX1801

## 2016-12-04 SURGERY — IRRIGATION AND DEBRIDEMENT HIP
Anesthesia: General | Laterality: Right

## 2016-12-04 MED ORDER — BISACODYL 10 MG RE SUPP: 10.0000 mg | Freq: Every day | RECTAL | Status: DC | PRN

## 2016-12-04 MED ORDER — VANCOMYCIN HCL IN DEXTROSE 1-5 GM/200ML-% IV SOLN
1000.0000 mg | Freq: Two times a day (BID) | INTRAVENOUS | Status: AC
  Administered 2016-12-04 – 2016-12-05 (×2): 1000 mg via INTRAVENOUS
  Filled 2016-12-04 (×2): qty 200

## 2016-12-04 MED ORDER — PROPOFOL 10 MG/ML IV BOLUS
INTRAVENOUS | Status: AC
  Filled 2016-12-04: qty 20

## 2016-12-04 MED ORDER — ACETAMINOPHEN 10 MG/ML IV SOLN
INTRAVENOUS | Status: DC | PRN
Start: 2016-12-04 — End: 2016-12-04
  Administered 2016-12-04: 1000 mg via INTRAVENOUS

## 2016-12-04 MED ORDER — ACETAMINOPHEN 10 MG/ML IV SOLN
INTRAVENOUS | Status: AC
  Filled 2016-12-04: qty 100

## 2016-12-04 MED ORDER — LIDOCAINE HCL (CARDIAC) 20 MG/ML IV SOLN
INTRAVENOUS | Status: DC | PRN
  Administered 2016-12-04: 100 mg via INTRAVENOUS

## 2016-12-04 MED ORDER — MORPHINE SULFATE (PF) 2 MG/ML IV SOLN: 2.0000 mg | INTRAVENOUS | Status: DC | PRN

## 2016-12-04 MED ORDER — SODIUM CHLORIDE 0.9 % IJ SOLN
INTRAMUSCULAR | Status: AC
  Filled 2016-12-04: qty 50

## 2016-12-04 MED ORDER — ONDANSETRON HCL 4 MG/2ML IJ SOLN: 4.0000 mg | Freq: Four times a day (QID) | INTRAMUSCULAR | Status: DC | PRN

## 2016-12-04 MED ORDER — FENTANYL CITRATE (PF) 100 MCG/2ML IJ SOLN
25.0000 ug | INTRAMUSCULAR | Status: AC | PRN
  Administered 2016-12-04 (×6): 25 ug via INTRAVENOUS

## 2016-12-04 MED ORDER — ONDANSETRON HCL 4 MG/2ML IJ SOLN
INTRAMUSCULAR | Status: AC
  Filled 2016-12-04: qty 2

## 2016-12-04 MED ORDER — MIDAZOLAM HCL 2 MG/2ML IJ SOLN
INTRAMUSCULAR | Status: AC
  Filled 2016-12-04: qty 2

## 2016-12-04 MED ORDER — PROPOFOL 10 MG/ML IV BOLUS
INTRAVENOUS | Status: DC | PRN
  Administered 2016-12-04: 150 mg via INTRAVENOUS
  Administered 2016-12-04: 50 mg via INTRAVENOUS

## 2016-12-04 MED ORDER — FLUTICASONE FUROATE-VILANTEROL 200-25 MCG/INH IN AEPB
1.0000 | INHALATION_SPRAY | Freq: Every day | RESPIRATORY_TRACT | Status: DC
  Filled 2016-12-04: qty 28

## 2016-12-04 MED ORDER — METOCLOPRAMIDE HCL 10 MG PO TABS: 5.0000 mg | ORAL_TABLET | Freq: Three times a day (TID) | ORAL | Status: DC | PRN

## 2016-12-04 MED ORDER — FENTANYL CITRATE (PF) 100 MCG/2ML IJ SOLN
INTRAMUSCULAR | Status: AC
  Filled 2016-12-04: qty 2

## 2016-12-04 MED ORDER — GLYCOPYRROLATE 0.2 MG/ML IJ SOLN
INTRAMUSCULAR | Status: AC
  Filled 2016-12-04: qty 1

## 2016-12-04 MED ORDER — MIDAZOLAM HCL 2 MG/2ML IJ SOLN
INTRAMUSCULAR | Status: DC | PRN
  Administered 2016-12-04: 2 mg via INTRAVENOUS

## 2016-12-04 MED ORDER — LACTATED RINGERS IV SOLN
INTRAVENOUS | Status: DC | PRN
  Administered 2016-12-04: 12:00:00 via INTRAVENOUS

## 2016-12-04 MED ORDER — FENTANYL CITRATE (PF) 100 MCG/2ML IJ SOLN
INTRAMUSCULAR | Status: AC
  Administered 2016-12-04: 25 ug via INTRAVENOUS
  Filled 2016-12-04: qty 2

## 2016-12-04 MED ORDER — CEFAZOLIN SODIUM-DEXTROSE 2-4 GM/100ML-% IV SOLN
INTRAVENOUS | Status: AC
  Filled 2016-12-04: qty 100

## 2016-12-04 MED ORDER — FENTANYL CITRATE (PF) 100 MCG/2ML IJ SOLN
INTRAMUSCULAR | Status: DC | PRN
  Administered 2016-12-04: 100 ug via INTRAVENOUS

## 2016-12-04 MED ORDER — HYDROCODONE-ACETAMINOPHEN 5-325 MG PO TABS
1.0000 | ORAL_TABLET | ORAL | Status: DC | PRN
  Administered 2016-12-04 – 2016-12-05 (×3): 2 via ORAL
  Filled 2016-12-04 (×3): qty 2

## 2016-12-04 MED ORDER — OXYCODONE HCL 5 MG PO TABS: 5.0000 mg | ORAL_TABLET | ORAL | Status: DC | PRN

## 2016-12-04 MED ORDER — METHYLENE BLUE 0.5 % INJ SOLN
INTRAVENOUS | Status: AC
  Filled 2016-12-04: qty 10

## 2016-12-04 MED ORDER — NEOMYCIN-POLYMYXIN B GU 40-200000 IR SOLN
Status: AC
  Filled 2016-12-04: qty 4

## 2016-12-04 MED ORDER — METHOCARBAMOL 1000 MG/10ML IJ SOLN
500.0000 mg | Freq: Four times a day (QID) | INTRAMUSCULAR | Status: DC | PRN
  Filled 2016-12-04: qty 5

## 2016-12-04 MED ORDER — PANTOPRAZOLE SODIUM 40 MG PO TBEC
40.0000 mg | DELAYED_RELEASE_TABLET | Freq: Every day | ORAL | Status: DC
  Administered 2016-12-05: 40 mg via ORAL
  Filled 2016-12-04: qty 1

## 2016-12-04 MED ORDER — DEXAMETHASONE SODIUM PHOSPHATE 10 MG/ML IJ SOLN
INTRAMUSCULAR | Status: DC | PRN
  Administered 2016-12-04: 10 mg via INTRAVENOUS

## 2016-12-04 MED ORDER — MAGNESIUM CITRATE PO SOLN
1.0000 | Freq: Once | ORAL | Status: DC | PRN
  Filled 2016-12-04: qty 296

## 2016-12-04 MED ORDER — NEOMYCIN-POLYMYXIN B GU 40-200000 IR SOLN
Status: DC | PRN
Start: 2016-12-04 — End: 2016-12-04
  Administered 2016-12-04: 4 mL

## 2016-12-04 MED ORDER — SODIUM CHLORIDE 0.9 % IV SOLN
INTRAVENOUS | Status: DC
  Administered 2016-12-04: 17:00:00 via INTRAVENOUS
  Administered 2016-12-05: 75 mL/h via INTRAVENOUS

## 2016-12-04 MED ORDER — DOCUSATE SODIUM 100 MG PO CAPS
100.0000 mg | ORAL_CAPSULE | Freq: Two times a day (BID) | ORAL | Status: DC
  Administered 2016-12-04 – 2016-12-05 (×3): 100 mg via ORAL
  Filled 2016-12-04 (×3): qty 1

## 2016-12-04 MED ORDER — GLYCOPYRROLATE 0.2 MG/ML IJ SOLN
INTRAMUSCULAR | Status: DC | PRN
  Administered 2016-12-04: 0.2 mg via INTRAVENOUS

## 2016-12-04 MED ORDER — METHYLENE BLUE 0.5 % INJ SOLN
INTRAVENOUS | Status: DC | PRN
Start: 2016-12-04 — End: 2016-12-04
  Administered 2016-12-04: 10 mL

## 2016-12-04 MED ORDER — LISINOPRIL 5 MG PO TABS
5.0000 mg | ORAL_TABLET | Freq: Every day | ORAL | Status: DC
  Administered 2016-12-05: 5 mg via ORAL
  Filled 2016-12-04: qty 1

## 2016-12-04 MED ORDER — ONDANSETRON HCL 4 MG/2ML IJ SOLN: 4.0000 mg | Freq: Once | INTRAMUSCULAR | Status: DC | PRN

## 2016-12-04 MED ORDER — MAGNESIUM HYDROXIDE 400 MG/5ML PO SUSP: 30.0000 mL | Freq: Every day | ORAL | Status: DC | PRN

## 2016-12-04 MED ORDER — IPRATROPIUM-ALBUTEROL 0.5-2.5 (3) MG/3ML IN SOLN: 3.0000 mL | Freq: Four times a day (QID) | RESPIRATORY_TRACT | Status: DC | PRN

## 2016-12-04 MED ORDER — ATORVASTATIN CALCIUM 10 MG PO TABS
10.0000 mg | ORAL_TABLET | Freq: Every day | ORAL | Status: DC
  Administered 2016-12-05: 10 mg via ORAL
  Filled 2016-12-04 (×2): qty 1

## 2016-12-04 MED ORDER — ONDANSETRON HCL 4 MG/2ML IJ SOLN
INTRAMUSCULAR | Status: DC | PRN
  Administered 2016-12-04: 4 mg via INTRAVENOUS

## 2016-12-04 MED ORDER — NADOLOL 20 MG PO TABS
40.0000 mg | ORAL_TABLET | Freq: Every day | ORAL | Status: DC
  Administered 2016-12-05: 40 mg via ORAL
  Filled 2016-12-04: qty 2

## 2016-12-04 MED ORDER — METOCLOPRAMIDE HCL 5 MG/ML IJ SOLN: 5.0000 mg | Freq: Three times a day (TID) | INTRAMUSCULAR | Status: DC | PRN

## 2016-12-04 MED ORDER — LIDOCAINE HCL (PF) 2 % IJ SOLN
INTRAMUSCULAR | Status: AC
  Filled 2016-12-04: qty 2

## 2016-12-04 MED ORDER — DEXAMETHASONE SODIUM PHOSPHATE 10 MG/ML IJ SOLN
INTRAMUSCULAR | Status: AC
  Filled 2016-12-04: qty 1

## 2016-12-04 MED ORDER — VITAMIN D 1000 UNITS PO TABS
5000.0000 [IU] | ORAL_TABLET | Freq: Every day | ORAL | Status: DC
  Administered 2016-12-05: 5000 [IU] via ORAL
  Filled 2016-12-04 (×2): qty 5

## 2016-12-04 MED ORDER — ONDANSETRON HCL 4 MG PO TABS: 4.0000 mg | ORAL_TABLET | Freq: Four times a day (QID) | ORAL | Status: DC | PRN

## 2016-12-04 MED ORDER — ZOLPIDEM TARTRATE 5 MG PO TABS: 5.0000 mg | ORAL_TABLET | Freq: Every evening | ORAL | Status: DC | PRN

## 2016-12-04 MED ORDER — MELOXICAM 7.5 MG PO TABS
15.0000 mg | ORAL_TABLET | Freq: Every day | ORAL | Status: DC
  Administered 2016-12-04 – 2016-12-05 (×2): 15 mg via ORAL
  Filled 2016-12-04 (×2): qty 2

## 2016-12-04 MED ORDER — METHOCARBAMOL 500 MG PO TABS: 500.0000 mg | ORAL_TABLET | Freq: Four times a day (QID) | ORAL | Status: DC | PRN

## 2016-12-04 MED ORDER — RENA-VITE PO TABS
1.0000 | ORAL_TABLET | Freq: Every day | ORAL | Status: DC
  Administered 2016-12-05: 1 via ORAL
  Filled 2016-12-04 (×2): qty 1

## 2016-12-04 SURGICAL SUPPLY — 52 items
BNDG COHESIVE 6X5 TAN STRL LF (GAUZE/BANDAGES/DRESSINGS) ×3 IMPLANT
CANISTER SUCT 1200ML W/VALVE (MISCELLANEOUS) ×3 IMPLANT
CANISTER SUCT 3000ML PPV (MISCELLANEOUS) ×3 IMPLANT
CHLORAPREP W/TINT 26ML (MISCELLANEOUS) IMPLANT
DECANTER SPIKE VIAL GLASS SM (MISCELLANEOUS) ×3 IMPLANT
DRAPE INCISE IOBAN 66X60 STRL (DRAPES) ×3 IMPLANT
DRAPE SHEET LG 3/4 BI-LAMINATE (DRAPES) ×3 IMPLANT
DRAPE WOUND VAC 10X15X1CM (MISCELLANEOUS) IMPLANT
DRESSING SURGICEL FIBRLLR 1X2 (HEMOSTASIS) ×1 IMPLANT
DRSG EMULSION OIL 3X8 NADH (GAUZE/BANDAGES/DRESSINGS) ×3 IMPLANT
DRSG SURGICEL FIBRILLAR 1X2 (HEMOSTASIS) ×3
ELECT CAUTERY BLADE 6.4 (BLADE) ×3 IMPLANT
ELECT REM PT RETURN 9FT ADLT (ELECTROSURGICAL) ×3
ELECTRODE REM PT RTRN 9FT ADLT (ELECTROSURGICAL) ×1 IMPLANT
EVACUATOR 1/8 PVC DRAIN (DRAIN) ×3 IMPLANT
GAUZE PETRO XEROFOAM 1X8 (MISCELLANEOUS) ×6 IMPLANT
GAUZE SPONGE 4X4 12PLY STRL (GAUZE/BANDAGES/DRESSINGS) ×3 IMPLANT
GLOVE BIOGEL PI IND STRL 9 (GLOVE) ×1 IMPLANT
GLOVE BIOGEL PI INDICATOR 9 (GLOVE) ×2
GLOVE SURG SYN 9.0  PF PI (GLOVE) ×2
GLOVE SURG SYN 9.0 PF PI (GLOVE) ×1 IMPLANT
GOWN SRG 2XL LVL 4 RGLN SLV (GOWNS) ×1 IMPLANT
GOWN STRL NON-REIN 2XL LVL4 (GOWNS) ×2
GOWN STRL REUS W/ TWL LRG LVL3 (GOWN DISPOSABLE) ×1 IMPLANT
GOWN STRL REUS W/TWL LRG LVL3 (GOWN DISPOSABLE) ×2
GOWN STRL REUS W/TWL XL LVL4 (GOWN DISPOSABLE) ×3 IMPLANT
HEMOVAC 400ML (MISCELLANEOUS) ×3
KIT DRAIN HEMOVAC JP 7FR 400ML (MISCELLANEOUS) ×1 IMPLANT
KIT RM TURNOVER STRD PROC AR (KITS) ×3 IMPLANT
NDL SAFETY 18GX1.5 (NEEDLE) ×3 IMPLANT
NEEDLE FILTER BLUNT 18X 1/2SAF (NEEDLE) ×4
NEEDLE FILTER BLUNT 18X1 1/2 (NEEDLE) ×2 IMPLANT
NEEDLE MAYO CATGUT SZ4 (NEEDLE) IMPLANT
NS IRRIG 1000ML POUR BTL (IV SOLUTION) ×3 IMPLANT
PACK HIP PROSTHESIS (MISCELLANEOUS) ×3 IMPLANT
PULSAVAC PLUS IRRIG FAN TIP (DISPOSABLE) ×2
STAPLER SKIN PROX 35W (STAPLE) ×3 IMPLANT
SUT ETHIBOND #5 BRAIDED 30INL (SUTURE) IMPLANT
SUT TICRON 2-0 30IN 311381 (SUTURE) IMPLANT
SUT V-LOC 90 ABS DVC 3-0 CL (SUTURE) ×3 IMPLANT
SUT VIC AB 0 CT1 27 (SUTURE)
SUT VIC AB 0 CT1 27XCR 8 STRN (SUTURE) IMPLANT
SUT VIC AB 1 CTX 27 (SUTURE) IMPLANT
SUT VIC AB 2-0 CT1 27 (SUTURE)
SUT VIC AB 2-0 CT1 TAPERPNT 27 (SUTURE) IMPLANT
SWAB CULTURE AMIES ANAERIB BLU (MISCELLANEOUS) ×3 IMPLANT
SYR 10ML LL (SYRINGE) ×2 IMPLANT
SYRINGE 10CC LL (SYRINGE) ×3 IMPLANT
TAPE MICROFOAM 4IN (TAPE) ×3 IMPLANT
TIP BRUSH PULSAVAC PLUS 24.33 (MISCELLANEOUS) ×2 IMPLANT
TIP FAN IRRIG PULSAVAC PLUS (DISPOSABLE) ×1 IMPLANT
WND VAC CANISTER 500ML (MISCELLANEOUS) IMPLANT

## 2016-12-04 NOTE — H&P (Signed)
Reviewed paper H+P, will be scanned into chart. No changes noted.  

## 2016-12-04 NOTE — Anesthesia Procedure Notes (Signed)
Procedure Name: LMA Insertion Date/Time: 12/04/2016 12:59 PM Performed by: Junious SilkNOLES, August Longest Pre-anesthesia Checklist: Patient identified, Patient being monitored, Timeout performed, Emergency Drugs available and Suction available Patient Re-evaluated:Patient Re-evaluated prior to inductionOxygen Delivery Method: Circle system utilized Preoxygenation: Pre-oxygenation with 100% oxygen Intubation Type: IV induction Ventilation: Mask ventilation without difficulty LMA: LMA inserted LMA Size: 4.0 Tube type: Oral Number of attempts: 1 Placement Confirmation: positive ETCO2 and breath sounds checked- equal and bilateral Tube secured with: Tape Dental Injury: Teeth and Oropharynx as per pre-operative assessment

## 2016-12-04 NOTE — Anesthesia Post-op Follow-up Note (Cosign Needed)
Anesthesia QCDR form completed.        

## 2016-12-04 NOTE — Anesthesia Postprocedure Evaluation (Signed)
Anesthesia Post Note  Patient: Danielle RochesterJanet B [name]  Procedure(s) Performed: Procedure(s) (LRB): IRRIGATION AND DEBRIDEMENT HIP (Right)  Patient location during evaluation: PACU Anesthesia Type: General Level of consciousness: awake and alert Pain management: pain level controlled Vital Signs Assessment: post-procedure vital signs reviewed and stable Respiratory status: spontaneous breathing and respiratory function stable Cardiovascular status: stable Anesthetic complications: no     Last Vitals:  Vitals:   12/04/16 1415 12/04/16 1440  BP: (!) 122/50 (!) 118/48  Pulse: 65 (!) 50  Resp: (!) 26 (!) 24  Temp: 36.4 C 36.4 C    Last Pain:  Vitals:   12/04/16 1440  TempSrc: Oral  PainSc: 8                  Renezmae Canlas K

## 2016-12-04 NOTE — Transfer of Care (Signed)
Immediate Anesthesia Transfer of Care Note  Patient: Danielle RochesterJanet B Teale  Procedure(s) Performed: Procedure(s): IRRIGATION AND DEBRIDEMENT HIP (Right)  Patient Location: PACU  Anesthesia Type:General  Level of Consciousness: awake, alert  and oriented  Airway & Oxygen Therapy: Patient Spontanous Breathing and Patient connected to face mask oxygen  Post-op Assessment: Report given to RN and Post -op Vital signs reviewed and stable  Post vital signs: Reviewed and stable  Last Vitals:  Vitals:   12/04/16 1139  BP: 140/70  Pulse: 70  Resp: 20  Temp: 36.8 C    Last Pain:  Vitals:   12/04/16 1139  TempSrc: Oral         Complications: No apparent anesthesia complications

## 2016-12-04 NOTE — Anesthesia Preprocedure Evaluation (Signed)
Anesthesia Evaluation  Patient identified by MRN, date of birth, ID band Patient awake    Reviewed: Allergy & Precautions, NPO status , Patient's Chart, lab work & pertinent test results  History of Anesthesia Complications (+) history of anesthetic complications ("nerve pain after spinal")  Airway Mallampati: II       Dental  (+) Partial Lower, Chipped, Missing, Poor Dentition   Pulmonary COPD,  COPD inhaler, former smoker,           Cardiovascular hypertension, Pt. on medications      Neuro/Psych Anxiety Depression    GI/Hepatic Neg liver ROS, GERD  Medicated and Controlled,  Endo/Other  negative endocrine ROS  Renal/GU negative Renal ROS     Musculoskeletal   Abdominal   Peds  Hematology  (+) anemia ,   Anesthesia Other Findings   Reproductive/Obstetrics                             Anesthesia Physical Anesthesia Plan  ASA: III  Anesthesia Plan: General   Post-op Pain Management:    Induction: Intravenous  Airway Management Planned: LMA  Additional Equipment:   Intra-op Plan:   Post-operative Plan:   Informed Consent: I have reviewed the patients History and Physical, chart, labs and discussed the procedure including the risks, benefits and alternatives for the proposed anesthesia with the patient or authorized representative who has indicated his/her understanding and acceptance.     Plan Discussed with:   Anesthesia Plan Comments:         Anesthesia Quick Evaluation

## 2016-12-04 NOTE — Op Note (Signed)
12/04/2016  1:41 PM  PATIENT:  Danielle RochesterJanet B [name]  56 y.o. female  PRE-OPERATIVE DIAGNOSIS:  HEMATOMA RIGHT HIP, abscess, abscess  POST-OPERATIVE DIAGNOSIS:  hematoma right hip  PROCEDURE:  Procedure(s): IRRIGATION AND DEBRIDEMENT HIP (Right)  SURGEON: Leitha SchullerMichael J Aprille Sawhney, MD  ASSISTANTS: None  ANESTHESIA:   general  EBL:  Total I/O In: 500 [I.V.:500] Out: 25 [Blood:25]  BLOOD ADMINISTERED:none  DRAINS: (2) Hemovact drain(s) in the Subcutaneous layer with  Suction Open   LOCAL MEDICATIONS USED:  NONE  SPECIMEN:  Source of Specimen:  Deep wound culture  DISPOSITION OF SPECIMEN:  PATHOLOGY  COUNTS:  YES  TOURNIQUET:  * No tourniquets in log *  IMPLANTS: None  DICTATION: .Dragon Dictation patient brought the operating room and after adequate anesthesia was obtained a bump was put on placed underneath right buttock and the hip was prepped and draped in sterile fashion. After patient education timeout procedure were completed the skin edges were elliptically excised and a deep culture taken. Superficial scar tissue was then debrided with use of a scalpel getting down to the muscular layer. At this point methylene blue was applied to the wound and there did not appear to be tracking. The muscle was also partially debrided to get to fresh tissue. At this point the wound was irrigated with pulsatile lavage with a dilute Betadine solution. When this was done there on probing there did not appear to be a tracking further proximal or distal. The wound was then closed with deep muscle left alone with drains placed at that level with fibrillar placed over this followed by 3-0 v-loc and skin staples followed by Xeroform 4 x 4's and ABDs  PLAN OF CARE: Admit for overnight observation  PATIENT DISPOSITION:  PACU - hemodynamically stable.

## 2016-12-05 DIAGNOSIS — L7632 Postprocedural hematoma of skin and subcutaneous tissue following other procedure: Secondary | ICD-10-CM | POA: Diagnosis not present

## 2016-12-05 MED ORDER — SULFAMETHOXAZOLE-TRIMETHOPRIM 800-160 MG PO TABS
1.0000 | ORAL_TABLET | Freq: Two times a day (BID) | ORAL | Status: DC
  Administered 2016-12-05: 1 via ORAL
  Filled 2016-12-05: qty 1

## 2016-12-05 MED ORDER — OXYCODONE HCL 5 MG PO TABS: 5.0000 mg | ORAL_TABLET | Freq: Four times a day (QID) | ORAL | 0 refills | Status: DC | PRN

## 2016-12-05 MED ORDER — SULFAMETHOXAZOLE-TRIMETHOPRIM 800-160 MG PO TABS: 1.0000 | ORAL_TABLET | Freq: Two times a day (BID) | ORAL | 0 refills | Status: DC

## 2016-12-05 NOTE — Progress Notes (Signed)
   Subjective: 1 Day Post-Op Procedure(s) (LRB): IRRIGATION AND DEBRIDEMENT HIP (Right) Patient reports pain as mild.   Patient is well, and has had no acute complaints or problems Denies any CP, SOB, ABD pain. We will continue therapy today.  Plan is to go Home after hospital stay.  Objective: Vital signs in last 24 hours: Temp:  [97.2 F (36.2 C)-98.2 F (36.8 C)] 98.2 F (36.8 C) (05/25 0058) Pulse Rate:  [47-70] 54 (05/25 0058) Resp:  [9-26] 18 (05/24 1921) BP: (116-145)/(36-80) 116/49 (05/25 0058) SpO2:  [95 %-100 %] 99 % (05/25 0058) Weight:  [84.4 kg (186 lb 1.8 oz)] 84.4 kg (186 lb 1.8 oz) (05/24 1440)  Intake/Output from previous day: 05/24 0701 - 05/25 0700 In: 2485 [P.O.:600; I.V.:1485; IV Piggyback:400] Out: 325 [Urine:300; Blood:25] Intake/Output this shift: Total I/O In: 360 [P.O.:360] Out: -   No results for input(s): HGB in the last 72 hours. No results for input(s): WBC, RBC, HCT, PLT in the last 72 hours. No results for input(s): NA, K, CL, CO2, BUN, CREATININE, GLUCOSE, CALCIUM in the last 72 hours. No results for input(s): LABPT, INR in the last 72 hours.  EXAM General - Patient is Alert, Appropriate and Oriented Extremity - Sensation intact distally Intact pulses distally Dorsiflexion/Plantar flexion intact No cellulitis present Compartment soft Dressing - dressing C/D/I and hemovac removed Motor Function - intact, moving foot and toes well on exam.   Past Medical History:  Diagnosis Date  . 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 Anemia   . Anxiety   . Arthritis   . Cigarette smoker   . Cirrhosis (HCC) 09/19/2016  . Cirrhosis of liver (HCC)   . COPD (chronic obstructive pulmonary disease) (HCC)   . Depression   . GERD (gastroesophageal reflux disease)   . Hypertension   . Primary osteoarthritis of right hip 10/16/2015  . Shortness of breath dyspnea    with exertion  . 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 Spine pain,  lumbar   . Tobacco abuse 05/19/2016    Assessment/Plan:   1 Day Post-Op Procedure(s) (LRB): IRRIGATION AND DEBRIDEMENT HIP (Right) Active Problems:   Abscess of right thigh  Estimated body mass index is 32.97 kg/m as calculated from the following:   Height as of this encounter: 5\' 3"  (1.6 m).   Weight as of this encounter: 84.4 kg (186 lb 1.8 oz). Advance diet Discharge home today Follow up with [facility] on Tuesday for dressing change Discharge with PO abx  Weight-Bearing as tolerated to right leg   T. Cranston Neighborhris Maryhelen Lindler, PA-C [facility] 12/05/2016, 11:30 AM

## 2016-12-05 NOTE — Discharge Summary (Signed)
Physician Discharge Summary  Patient ID: [name] MRN: [id number] DOB/AGE: 55-12-1960 55 y.o.  Admit date: 12/04/2016 Discharge date: 12/05/2016  Admission Diagnoses:  HEMATOMA RIGHT HIP   Discharge Diagnoses: Patient Active Problem List   Diagnosis Date Noted  . Abscess of right thigh 12/04/2016  . Arthritis 09/19/2016  . Cirrhosis (HCC) 09/19/2016  . COPD (chronic obstructive pulmonary disease) (HCC) 09/19/2016  . Tobacco abuse 05/19/2016  . Spondylosis of cervical region without myelopathy or radiculopathy 03/12/2016  . White matter changes 03/12/2016  . 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 Primary osteoarthritis of right hip 10/16/2015    Past Medical History:  Diagnosis Date  . 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 Anemia   . Anxiety   . Arthritis   . Cigarette smoker   . Cirrhosis (HCC) 09/19/2016  . Cirrhosis of liver (HCC)   . COPD (chronic obstructive pulmonary disease) (HCC)   . Depression   . GERD (gastroesophageal reflux disease)   . Hypertension   . Primary osteoarthritis of right hip 10/16/2015  . Shortness of breath dyspnea    with exertion  . 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 Spine pain, lumbar   . Tobacco abuse 05/19/2016     Transfusion: none   Consultants (if any):   Discharged Condition: Improved  Hospital Course: [name] is an 56 y.o. female who was admitted 12/04/2016 with a diagnosis of right thigh hematoma and went to the operating room on 12/04/2016 and underwent the above named procedures.    Surgeries: Procedure(s): IRRIGATION AND DEBRIDEMENT HIP on 12/04/2016 Patient tolerated the surgery well. Taken to PACU where she was stabilized and then transferred to the orthopedic floor.   On post op day #1 patient was stable and ready for discharge to home. Patient completed IV abx. Discharge home with oral abx.     She was given perioperative  antibiotics:  Anti-infectives    Start     Dose/Rate Route Frequency Ordered Stop   12/05/16 1200  sulfamethoxazole-trimethoprim (BACTRIM DS,SEPTRA DS) 800-160 MG per tablet 1 tablet     1 tablet Oral Every 12 hours 12/05/16 1135 12/15/16 0959   12/05/16 0000  sulfamethoxazole-trimethoprim (BACTRIM DS,SEPTRA DS) 800-160 MG tablet     1 tablet Oral Every 12 hours 12/05/16 1138     12/04/16 1600  vancomycin (VANCOCIN) IVPB 1000 mg/200 mL premix     1,000 mg 200 mL/hr over 60 Minutes Intravenous Every 12 hours 12/04/16 1445 12/05/16 0540   12/04/16 0938  ceFAZolin (ANCEF) 2-4 GM/100ML-% IVPB    Comments:  Slemenda, Debbie: cabinet override      12/04/16 0938 12/04/16 1255   12/03/16 2245  ceFAZolin (ANCEF) IVPB 2g/100 mL premix     2 g 200 mL/hr over 30 Minutes Intravenous  Once 12/03/16 2241 12/04/16 1255    .  She was given sequential compression devices, early ambulation  She benefited maximally from the hospital stay and there were no complications.    Recent vital signs:  Vitals:   12/04/16 1921 12/05/16 0058  BP: (!) 145/36 (!) 116/49  Pulse: (!) 47 (!) 54  Resp: 18   Temp: 97.8 F (36.6 C) 98.2 F (36.8 C)    Recent laboratory studies:  Lab Results  Component Value Date   HGB 11.5 (L) 12/01/2016   HGB 13.4 11/22/2015   HGB 13.1 11/21/2015   Lab Results  Component Value Date  WBC 6.0 12/01/2016   PLT 149 (L) 12/01/2016   Lab Results  Component Value Date   INR 0.95 10/09/2015   Lab Results  Component Value Date   NA 135 11/22/2015   K 3.9 11/22/2015   CL 101 11/22/2015   CO2 27 11/22/2015   BUN 14 11/22/2015   CREATININE 0.67 11/22/2015   GLUCOSE 97 11/22/2015    Discharge Medications:   Allergies as of 12/05/2016      Reactions   Chantix [varenicline] Other (See Comments)   Pt stated that this Medication caused her tongue to "have a red streak" Per pt so she D/C this medication "streak on tongue"   Codeine Itching, Rash   Other Reaction: OTHER  REACTION      Medication List    TAKE these medications   ADVAIR DISKUS 250-50 MCG/DOSE Aepb Generic drug:  Fluticasone-Salmeterol Inhale 1 puff into the lungs 2 (two) times daily.   atorvastatin 10 MG tablet Commonly known as:  LIPITOR Take 10 mg by mouth daily.   COMBIVENT RESPIMAT 20-100 MCG/ACT Aers respimat Generic drug:  Ipratropium-Albuterol Inhale 1 puff into the lungs every 6 (six) hours as needed for wheezing.   lisinopril 5 MG tablet Commonly known as:  PRINIVIL,ZESTRIL Take 5 mg by mouth daily.   meloxicam 15 MG tablet Commonly known as:  MOBIC Take 15 mg by mouth daily.   nadolol 20 MG tablet Commonly known as:  CORGARD Take 40 mg by mouth daily.   oxyCODONE 5 MG immediate release tablet Commonly known as:  Oxy IR/ROXICODONE Take 1-2 tablets (5-10 mg total) by mouth every 6 (six) hours as needed for breakthrough pain.   pantoprazole 40 MG tablet Commonly known as:  PROTONIX Take 40 mg by mouth daily.   sulfamethoxazole-trimethoprim 800-160 MG tablet Commonly known as:  BACTRIM DS,SEPTRA DS Take 1 tablet by mouth every 12 (twelve) hours.   Vitamin B Complex Tabs Take 1 tablet by mouth daily.   Vitamin D3 5000 units Caps Take 5,000 Units by mouth daily.       Diagnostic Studies: No results found.  Disposition: 01-Home or Self Care    Follow-up Information    Evon SlackGaines, Thomas C, PA-C Follow up on 12/09/2016.   Specialties:  Orthopedic Surgery, Emergency Medicine Why:  @ 4:00 pm Contact information: [address] [place] [address] [phone]            Signed: Patience MuscaGAINES, THOMAS CHRISTOPHER 12/05/2016, 11:39 AM

## 2016-12-05 NOTE — Discharge Instructions (Signed)
Diet: As you were doing prior to hospitalization   Shower:  May shower but keep the wounds dry, use an occlusive plastic wrap, NO SOAKING IN TUB.  If the bandage gets wet, change with a clean dry gauze.  Dressing:  Keep clean and dry until first post op appointment  Activity:  Increase activity slowly as tolerated, but follow the weight bearing instructions below.  No lifting or driving for 6 weeks.  Weight Bearing:   Weight bearing as tolerated to left lower extremity  To prevent constipation: you may use a stool softener such as -  Colace (over the counter) 100 mg by mouth twice a day  Drink plenty of fluids (prune juice may be helpful) and high fiber foods Miralax (over the counter) for constipation as needed.    Itching:  If you experience itching with your medications, try taking only a single pain pill, or even half a pain pill at a time.  You may take up to 10 pain pills per day, and you can also use benadryl over the counter for itching or also to help with sleep.   Precautions:  If you experience chest pain or shortness of breath - call 911 immediately for transfer to the hospital emergency department!!  If you develop a fever greater that 101 F, purulent drainage from wound, increased redness or drainage from wound, or calf pain-Call [facility]                                              Follow- Up Appointment:  [facility]

## 2016-12-05 NOTE — Discharge Planning (Signed)
Patient IV removed.  Discharge papers given, explained and educated.  Informed of suggested FU appts and appts made.  Scrips given and signed for pain meds.  RN assessment and VS revealed stability for DC to home.  Once ready, will be wheeled to front and family transporting via car.

## 2016-12-09 LAB — AEROBIC/ANAEROBIC CULTURE W GRAM STAIN (SURGICAL/DEEP WOUND): Culture: NO GROWTH

## 2016-12-09 LAB — AEROBIC/ANAEROBIC CULTURE (SURGICAL/DEEP WOUND)

## 2016-12-29 ENCOUNTER — Encounter: Payer: Medicare Other | Attending: Surgery | Admitting: Surgery

## 2016-12-29 DIAGNOSIS — K219 Gastro-esophageal reflux disease without esophagitis: Secondary | ICD-10-CM | POA: Insufficient documentation

## 2016-12-29 DIAGNOSIS — F17218 Nicotine dependence, cigarettes, with other nicotine-induced disorders: Secondary | ICD-10-CM | POA: Diagnosis not present

## 2016-12-29 DIAGNOSIS — K7460 Unspecified cirrhosis of liver: Secondary | ICD-10-CM | POA: Insufficient documentation

## 2016-12-29 DIAGNOSIS — Z885 Allergy status to narcotic agent status: Secondary | ICD-10-CM | POA: Diagnosis not present

## 2016-12-29 DIAGNOSIS — T8131XA Disruption of external operation (surgical) wound, not elsewhere classified, initial encounter: Secondary | ICD-10-CM | POA: Insufficient documentation

## 2016-12-29 DIAGNOSIS — M1990 Unspecified osteoarthritis, unspecified site: Secondary | ICD-10-CM | POA: Insufficient documentation

## 2016-12-29 DIAGNOSIS — F329 Major depressive disorder, single episode, unspecified: Secondary | ICD-10-CM | POA: Diagnosis not present

## 2016-12-29 DIAGNOSIS — J449 Chronic obstructive pulmonary disease, unspecified: Secondary | ICD-10-CM | POA: Insufficient documentation

## 2016-12-29 DIAGNOSIS — Y832 Surgical operation with anastomosis, bypass or graft as the cause of abnormal reaction of the patient, or of later complication, without mention of misadventure at the time of the procedure: Secondary | ICD-10-CM | POA: Insufficient documentation

## 2016-12-29 DIAGNOSIS — F1010 Alcohol abuse, uncomplicated: Secondary | ICD-10-CM | POA: Insufficient documentation

## 2016-12-29 DIAGNOSIS — R011 Cardiac murmur, unspecified: Secondary | ICD-10-CM | POA: Insufficient documentation

## 2016-12-29 DIAGNOSIS — Z79899 Other long term (current) drug therapy: Secondary | ICD-10-CM | POA: Insufficient documentation

## 2016-12-29 DIAGNOSIS — I10 Essential (primary) hypertension: Secondary | ICD-10-CM | POA: Insufficient documentation

## 2016-12-29 DIAGNOSIS — L97113 Non-pressure chronic ulcer of right thigh with necrosis of muscle: Secondary | ICD-10-CM | POA: Diagnosis not present

## 2016-12-29 NOTE — Progress Notes (Signed)
[name], [name] ([id number]) Visit Report for 12/29/2016 Allergy List Details Patient Name: [name], [name] Date of Service: 12/29/2016 10:30 AM Medical Record Number: [id number] Patient Account Number: [id number] Date of Birth/Sex: 11-06-1960 (56 y.o. Female) Treating RN: Phillis Haggis Primary Care Wilbert Schouten: Doristine Mango Other Clinician: Referring Haille Pardi: Amador Cunas Treating Denym Rahimi/Extender: Rudene Re in Treatment: 0 Allergies Active Allergies Chantix codeine Vicodin Allergy Notes Electronic Signature(s) Signed: 12/29/2016 3:47:26 PM By: Alejandro Mulling Entered By: Alejandro Mulling on 12/29/2016 10:34:13 [name], [name] ([id number]) -------------------------------------------------------------------------------- Arrival Information Details Patient Name: Danielle Seen B. Date of Service: 12/29/2016 10:30 AM Medical Record Number: [id number] Patient Account Number: [id number] Date of Birth/Sex: [birth year] (56 y.o. Female) Treating RN: Ashok Cordia, Debi Primary Care Lida Berkery: Doristine Mango Other Clinician: Referring Imari Sivertsen: Amador Cunas Treating Meloney Feld/Extender: Rudene Re in Treatment: 0 Visit Information Patient Arrived: Ambulatory Arrival Time: 10:29 Accompanied By: husband Transfer Assistance: None Patient Identification Verified: Yes Secondary Verification Process Yes Completed: Patient Requires Transmission-Based No Precautions: Patient Has Alerts: No Electronic Signature(s) Signed: 12/29/2016 3:47:26 PM By: Alejandro Mulling Entered By: Alejandro Mulling on 12/29/2016 10:32:48 [name], [name] ([id number]) -------------------------------------------------------------------------------- Clinic Level of Care Assessment Details Patient Name: Danielle Seen B. Date of Service: 12/29/2016 10:30 AM Medical Record Number: [id number] Patient Account Number: [id number] Date of Birth/Sex: 1960/10/01 (56 y.o. Female) Treating RN:  Ashok Cordia, Debi Primary Care Allice Garro: Doristine Mango Other Clinician: Referring Algernon Mundie: Amador Cunas Treating Charliegh Vasudevan/Extender: Rudene Re in Treatment: 0 Clinic Level of Care Assessment Items TOOL 1 Quantity Score X - Use when EandM and Procedure is performed on INITIAL visit 1 0 ASSESSMENTS - Nursing Assessment / Reassessment X - General Physical Exam (combine w/ comprehensive assessment (listed just 1 20 below) when performed on new pt. evals) X - Comprehensive Assessment (HX, ROS, Risk Assessments, Wounds Hx, etc.) 1 25 ASSESSMENTS - Wound and Skin Assessment / Reassessment []  - Dermatologic / Skin Assessment (not related to wound area) 0 ASSESSMENTS - Ostomy and/or Continence Assessment and Care []  - Incontinence Assessment and Management 0 []  - Ostomy Care Assessment and Management (repouching, etc.) 0 PROCESS - Coordination of Care []  - Simple Patient / Family Education for ongoing care 0 X - Complex (extensive) Patient / Family Education for ongoing care 1 20 X - Staff obtains Chiropractor, Records, Test Results / Process Orders 1 10 X - Staff telephones HHA, Nursing Homes / Clarify orders / etc 1 10 []  - Routine Transfer to another Facility (non-emergent condition) 0 []  - Routine Hospital Admission (non-emergent condition) 0 X - New Admissions / Manufacturing engineer / Ordering NPWT, Apligraf, etc. 1 15 []  - Emergency Hospital Admission (emergent condition) 0 PROCESS - Special Needs []  - Pediatric / Minor Patient Management 0 []  - Isolation Patient Management 0 [name], [name] ([id number]) []  - Hearing / Language / Visual special needs 0 []  - Assessment of Community assistance (transportation, D/C planning, etc.) 0 []  - Additional assistance / Altered mentation 0 []  - Support Surface(s) Assessment (bed, cushion, seat, etc.) 0 INTERVENTIONS - Miscellaneous []  - External ear exam 0 []  - Patient Transfer (multiple staff / Nurse, adult / Similar devices)  0 []  - Simple Staple / Suture removal (25 or less) 0 []  - Complex Staple / Suture removal (26 or more) 0 []  - Hypo/Hyperglycemic Management (do not check if billed separately) 0 []  - Ankle / Brachial Index (ABI) - do not check if billed separately 0 Has the patient been seen at the hospital within the last three years: Yes  Total Score: 100 Level Of Care: New/Established - Level 3 Electronic Signature(s) Signed: 12/29/2016 3:47:26 PM By: Alejandro Mulling Entered By: Alejandro Mulling on 12/29/2016 11:45:23 [name], [name] ([id number]) -------------------------------------------------------------------------------- Encounter Discharge Information Details Patient Name: Danielle Seen B. Date of Service: 12/29/2016 10:30 AM Medical Record Number: [id number] Patient Account Number: [id number] Date of Birth/Sex: 09-14-1960 (56 y.o. Female) Treating RN: Ashok Cordia, Debi Primary Care Loyal Rudy: Doristine Mango Other Clinician: Referring Tawonna Esquer: Amador Cunas Treating Rondy Krupinski/Extender: Rudene Re in Treatment: 0 Encounter Discharge Information Items Discharge Pain Level: 0 Discharge Condition: Stable Ambulatory Status: Ambulatory Discharge Destination: Home Transportation: Private Auto Accompanied By: Donnamarie Rossetti Schedule Follow-up Appointment: Yes Medication Reconciliation completed and provided to Patient/Care No December Hedtke: Provided on Clinical Summary of Care: 12/29/2016 Form Type Recipient Paper Patient JW Electronic Signature(s) Signed: 12/29/2016 11:21:49 AM By: Gwenlyn Perking Entered By: Gwenlyn Perking on 12/29/2016 11:21:48 [name], [name] ([id number]) -------------------------------------------------------------------------------- Lower Extremity Assessment Details Patient Name: [name], [name] Date of Service: 12/29/2016 10:30 AM Medical Record Number: [id number] Patient Account Number: [id number] Date of Birth/Sex: 26-Mar-1961 (56 y.o. Female) Treating RN: Ashok Cordia,  Debi Primary Care Lelon Ikard: Doristine Mango Other Clinician: Referring Aquanetta Schwarz: Amador Cunas Treating Everado Pillsbury/Extender: Rudene Re in Treatment: 0 Electronic Signature(s) Signed: 12/29/2016 3:47:26 PM By: Alejandro Mulling Entered By: Alejandro Mulling on 12/29/2016 10:45:11 [name], [name] ([id number]) -------------------------------------------------------------------------------- Multi Wound Chart Details Patient Name: Marines, Tenelle B. Date of Service: 12/29/2016 10:30 AM Medical Record Number: [id number] Patient Account Number: [id number] Date of Birth/Sex: 31-Oct-1960 (56 y.o. Female) Treating RN: Ashok Cordia, Debi Primary Care Bryndan Bilyk: Doristine Mango Other Clinician: Referring Lynnae Ludemann: Amador Cunas Treating Jeanelle Dake/Extender: Rudene Re in Treatment: 0 Vital Signs Height(in): 63 Pulse(bpm): 62 Weight(lbs): 187.3 Blood Pressure 160/53 (mmHg): Body Mass Index(BMI): 33 Temperature(F): 97.9 Respiratory Rate 20 (breaths/min): Photos: [1:No Photos] [N/A:N/A] Wound Location: [1:Right Ischium] [N/A:N/A] Wounding Event: [1:Surgical Injury] [N/A:N/A] Primary Etiology: [1:Open Surgical Wound] [N/A:N/A] Comorbid History: [1:Chronic Obstructive Pulmonary Disease (COPD), Hypertension, Cirrhosis , Osteoarthritis] [N/A:N/A] Date Acquired: [1:12/15/2016] [N/A:N/A] Weeks of Treatment: [1:0] [N/A:N/A] Wound Status: [1:Open] [N/A:N/A] Measurements L x W x D 2x1x0.6 [N/A:N/A] (cm) Area (cm) : [1:1.571] [N/A:N/A] Volume (cm) : [1:0.942] [N/A:N/A] Starting Position 1 11 (o'clock): Ending Position 1 [1:1] (o'clock): Maximum Distance 1 2 (cm): Undermining: [1:Yes] [N/A:N/A] Classification: [1:Partial Thickness] [N/A:N/A] Exudate Amount: [1:Large] [N/A:N/A] Exudate Type: [1:Serosanguineous] [N/A:N/A] Exudate Color: [1:red, brown] [N/A:N/A] Wound Margin: [1:Distinct, outline attached] [N/A:N/A] Granulation Amount: [1:Large (67-100%)]  [N/A:N/A] Granulation Quality: [1:Red] [N/A:N/A] Necrotic Amount: [1:Small (1-33%)] [N/A:N/A] Epithelialization: None N/A N/A Debridement: Debridement (53664- N/A N/A 11047) Pre-procedure 11:05 N/A N/A Verification/Time Out Taken: Pain Control: Lidocaine 4% Topical N/A N/A Solution Tissue Debrided: Fibrin/Slough, Exudates, N/A N/A Subcutaneous Level: Skin/Subcutaneous N/A N/A Tissue Debridement Area (sq 2 N/A N/A cm): Instrument: Forceps N/A N/A Bleeding: Minimum N/A N/A Hemostasis Achieved: Pressure N/A N/A Procedural Pain: 0 N/A N/A Post Procedural Pain: 0 N/A N/A Debridement Treatment Procedure was tolerated N/A N/A Response: well Post Debridement 2x1x0.6 N/A N/A Measurements L x W x D (cm) Post Debridement 0.942 N/A N/A Volume: (cm) Periwound Skin Texture: No Abnormalities Noted N/A N/A Periwound Skin No Abnormalities Noted N/A N/A Moisture: Periwound Skin Color: No Abnormalities Noted N/A N/A Temperature: No Abnormality N/A N/A Tenderness on Yes N/A N/A Palpation: Wound Preparation: Ulcer Cleansing: N/A N/A Rinsed/Irrigated with Saline Topical Anesthetic Applied: Other: lidocaine 4% Procedures Performed: Debridement N/A N/A Treatment Notes Wound #1 (Right Ischium) 1. Cleansed with: Clean wound with Normal Saline 2. Anesthetic Topical Lidocaine 4% cream to wound bed prior to  debridement 3. Peri-wound Care: NATALIJA, MAVIS ([id number]) Skin Prep 4. Dressing Applied: Aquacel Ag 5. Secondary Dressing Applied Dry Gauze [place] Electronic Signature(s) Signed: 12/29/2016 11:14:15 AM By: Evlyn Kanner MD, FACS Entered By: Evlyn Kanner on 12/29/2016 11:14:15 [name], [name] ([id number]) -------------------------------------------------------------------------------- Multi-Disciplinary Care Plan Details Patient Name: Danielle Seen B. Date of Service: 12/29/2016 10:30 AM Medical Record Number: [id number] Patient Account Number: [id number] Date of  Birth/Sex: 1960/11/16 (56 y.o. Female) Treating RN: Ashok Cordia, Debi Primary Care Tonee Silverstein: Doristine Mango Other Clinician: Referring Elania Crowl: Amador Cunas Treating Eliya Geiman/Extender: Rudene Re in Treatment: 0 Active Inactive ` Abuse / Safety / Falls / Self Care Management Nursing Diagnoses: Potential for falls Goals: Patient will remain injury free related to falls Date Initiated: 12/29/2016 Target Resolution Date: 04/18/2017 Goal Status: Active Interventions: Assess personal safety and home safety (as indicated) on admission and as needed Assess self care needs on admission and as needed Notes: ` Orientation to the Wound Care Program Nursing Diagnoses: Knowledge deficit related to the wound healing center program Goals: Patient/caregiver will verbalize understanding of the Wound Healing Center Program Date Initiated: 12/29/2016 Target Resolution Date: 01/17/2017 Goal Status: Active Interventions: Provide education on orientation to the wound center Notes: ` Pain, Acute or Chronic Nursing Diagnoses: Pain, acute or chronic: actual or potential Blocher, Josely B. ([id number]) Potential alteration in comfort, pain Goals: Patient/caregiver will verbalize adequate pain control between visits Date Initiated: 12/29/2016 Target Resolution Date: 03/21/2017 Goal Status: Active Interventions: Assess comfort goal upon admission Complete pain assessment as per visit requirements Notes: ` Wound/Skin Impairment Nursing Diagnoses: Impaired tissue integrity Knowledge deficit related to smoking impact on wound healing Knowledge deficit related to ulceration/compromised skin integrity Goals: Ulcer/skin breakdown will have a volume reduction of 80% by week 12 Date Initiated: 12/29/2016 Target Resolution Date: 04/11/2017 Goal Status: Active Interventions: Assess patient/caregiver ability to perform ulcer/skin care regimen upon admission and as needed Assess ulceration(s)  every visit Notes: Electronic Signature(s) Signed: 12/29/2016 3:47:26 PM By: Alejandro Mulling Entered By: Alejandro Mulling on 12/29/2016 11:02:43 [name], [name] ([id number]) -------------------------------------------------------------------------------- Pain Assessment Details Patient Name: Danielle Seen B. Date of Service: 12/29/2016 10:30 AM Medical Record Number: [id number] Patient Account Number: [id number] Date of Birth/Sex: 13-Jul-1961 (55 y.o. Female) Treating RN: Ashok Cordia, Debi Primary Care Scarlette Hogston: Doristine Mango Other Clinician: Referring Marquett Bertoli: Amador Cunas Treating Maranatha Grossi/Extender: Rudene Re in Treatment: 0 Active Problems Location of Pain Severity and Description of Pain Patient Has Paino No Site Locations With Dressing Change: No Pain Management and Medication Current Pain Management: Electronic Signature(s) Signed: 12/29/2016 3:47:26 PM By: Alejandro Mulling Entered By: Alejandro Mulling on 12/29/2016 10:33:02 [name], [name] ([id number]) -------------------------------------------------------------------------------- Patient/Caregiver Education Details Patient Name: Danielle Seen B. Date of Service: 12/29/2016 10:30 AM Medical Record Number: [id number] Patient Account Number: [id number] Date of Birth/Gender: Aug 05, 1960 (56 y.o. Female) Treating RN: Ashok Cordia, Debi Primary Care Physician: Doristine Mango Other Clinician: Referring Physician: Amador Cunas Treating Physician/Extender: Rudene Re in Treatment: 0 Education Assessment Education Provided To: Patient Education Topics Provided Smoking and Wound Healing: Handouts: Smoking and Wound Healing Methods: Explain/Verbal Responses: State content correctly Welcome To The [facility]: Handouts: Welcome To [facility] Methods: Explain/Verbal Responses: State content correctly Wound/Skin Impairment: Handouts: Other: change dressing as ordered Methods:  Demonstration, Explain/Verbal Responses: State content correctly Electronic Signature(s) Signed: 12/29/2016 3:47:26 PM By: Alejandro Mulling Entered By: Alejandro Mulling on 12/29/2016 11:04:16 [name], [name] ([id number]) -------------------------------------------------------------------------------- Wound Assessment Details Patient Name: Julson, Frady B. Date of Service: 12/29/2016 10:30 AM Medical  Record Number: [id number] Patient Account Number: [id number] Date of Birth/Sex: Nov 26, 1960 (56 y.o. Female) Treating RN: Ashok CordiaPinkerton, Debi Primary Care Jaima Janney: WHITE, Lanora ManisELIZABETH Other Clinician: Referring Gardy Montanari: Amador CunasGAINES, THOMAS Treating Keir Viernes/Extender: Rudene ReBritto, Errol Weeks in Treatment: 0 Wound Status Wound Number: 1 Primary Open Surgical Wound Etiology: Wound Location: Right Ischium Wound Open Wounding Event: Surgical Injury Status: Date Acquired: 12/15/2016 Comorbid Chronic Obstructive Pulmonary Weeks Of Treatment: 0 History: Disease (COPD), Hypertension, Clustered Wound: No Cirrhosis , Osteoarthritis Photos Photo Uploaded By: Alejandro MullingPinkerton, Debra on 12/29/2016 11:43:25 Wound Measurements Length: (cm) 2 Width: (cm) 1 Depth: (cm) 0.6 Area: (cm) 1.571 Volume: (cm) 0.942 % Reduction in Area: % Reduction in Volume: Epithelialization: None Tunneling: No Undermining: Yes Starting Position (o'clock): 11 Ending Position (o'clock): 1 Maximum Distance: (cm) 2 Wound Description Classification: Partial Thickness Wound Margin: Distinct, outline attached Exudate Amount: Large Exudate Type: Serosanguineous Exudate Color: red, brown Foul Odor After Cleansing: No Slough/Fibrino No Wound Bed [place], Breeann B. ([id number]) Granulation Amount: Large (67-100%) Granulation Quality: Red Necrotic Amount: Small (1-33%) Necrotic Quality: Adherent Slough Periwound Skin Texture Texture Color No Abnormalities Noted: No No Abnormalities Noted: No Moisture Temperature / Pain No  Abnormalities Noted: No Temperature: No Abnormality Tenderness on Palpation: Yes Wound Preparation Ulcer Cleansing: Rinsed/Irrigated with Saline Topical Anesthetic Applied: Other: lidocaine 4%, Treatment Notes Wound #1 (Right Ischium) 1. Cleansed with: Clean wound with Normal Saline 2. Anesthetic Topical Lidocaine 4% cream to wound bed prior to debridement 3. Peri-wound Care: Skin Prep 4. Dressing Applied: Aquacel Ag 5. Secondary Dressing Applied Dry Gauze [place] Electronic Signature(s) Signed: 12/29/2016 3:47:26 PM By: Alejandro MullingPinkerton, Debra Entered By: Alejandro MullingPinkerton, Debra on 12/29/2016 10:54:58 Kirchhoff, [name] ([id number]) -------------------------------------------------------------------------------- Vitals Details Patient Name: Danielle SeenWORKMAN, Berdene B. Date of Service: 12/29/2016 10:30 AM Medical Record Number: [id number] Patient Account Number: [id number] Date of Birth/Sex: Nov 26, 1960 ([age] y.o. Female) Treating RN: Phillis HaggisPinkerton, Debi Primary Care Kimm Ungaro: WHITE, Lanora ManisELIZABETH Other Clinician: Referring Mariateresa Batra: Amador CunasGAINES, THOMAS Treating Alaja Goldinger/Extender: Rudene ReBritto, Errol Weeks in Treatment: 0 Vital Signs Time Taken: 10:33 Temperature (F): 97.9 Height (in): 63 Pulse (bpm): 62 Source: Stated Respiratory Rate (breaths/min): 20 Weight (lbs): 187.3 Blood Pressure (mmHg): 160/53 Source: Measured Reference Range: 80 - 120 mg / dl Body Mass Index (BMI): 33.2 Electronic Signature(s) Signed: 12/29/2016 3:47:26 PM By: Alejandro MullingPinkerton, Debra Entered By: Alejandro MullingPinkerton, Debra on 12/29/2016 10:33:39

## 2016-12-29 NOTE — Progress Notes (Signed)
[name] ([id number]) Visit Report for 12/29/2016 Chief Complaint Document Details Patient Name: [name], [name] Date of Service: 12/29/2016 10:30 AM Medical Record Number: [id number] Patient Account Number: [id number] Date of Birth/Sex: 1961/02/09 (56 y.o. Female) Treating RN: Ashok Cordia, Debi Primary Care Provider: Doristine Mango Other Clinician: Referring Provider: Amador Cunas Treating Provider/Extender: Rudene Re in Treatment: 0 Information Obtained from: Patient Chief Complaint Patient presents to the wound care center with open non-healing surgical wound to the right upper thigh which she's had for about 6 weeks and now Electronic Signature(s) Signed: 12/29/2016 11:15:07 AM By: Evlyn Kanner MD, FACS Entered By: Evlyn Kanner on 12/29/2016 11:15:07 Aleshire, Nicki [name] ([id number]) -------------------------------------------------------------------------------- Debridement Details Patient Name: Danielle Seen B. Date of Service: 12/29/2016 10:30 AM Medical Record Number: [id number] Patient Account Number: [id number] Date of Birth/Sex: [birth year] (56 y.o. Female) Treating RN: Ashok Cordia, Debi Primary Care Provider: WHITE, Lanora Manis Other Clinician: Referring Provider: Amador Cunas Treating Provider/Extender: Rudene Re in Treatment: 0 Debridement Performed for Wound #1 Right Ischium Assessment: Performed By: Physician Evlyn Kanner, MD Debridement: Debridement Pre-procedure Verification/Time Out Yes - 11:05 Taken: Start Time: 11:06 Pain Control: Lidocaine 4% Topical Solution Level: Skin/Subcutaneous Tissue Total Area Debrided (L x 2 (cm) x 1 (cm) = 2 (cm) W): Tissue and other Viable, Non-Viable, Exudate, Fibrin/Slough, Subcutaneous material debrided: Instrument: Forceps Bleeding: Minimum Hemostasis Achieved: Pressure End Time: 11:08 Procedural Pain: 0 Post Procedural Pain: 0 Response to Treatment: Procedure was tolerated well Post  Debridement Measurements of Total Wound Length: (cm) 2 Width: (cm) 1 Depth: (cm) 0.6 Volume: (cm) 0.942 Character of Wound/Ulcer Post Requires Further Debridement Debridement: Post Procedure Diagnosis Same as Pre-procedure Electronic Signature(s) Signed: 12/29/2016 11:14:28 AM By: Evlyn Kanner MD, FACS Signed: 12/29/2016 3:47:26 PM By: Alejandro Mulling Entered By: Evlyn Kanner on 12/29/2016 11:14:27 Spiers, Nicki [name] ([id number]) -------------------------------------------------------------------------------- HPI Details Patient Name: Danielle Seen B. Date of Service: 12/29/2016 10:30 AM Medical Record Number: [id number] Patient Account Number: [id number] Date of Birth/Sex: 01-27-1961 (56 y.o. Female) Treating RN: Ashok Cordia, Debi Primary Care Provider: Doristine Mango Other Clinician: Referring Provider: Amador Cunas Treating Provider/Extender: Rudene Re in Treatment: 0 History of Present Illness Location: right upper thigh Quality: Patient reports experiencing a dull pain to affected area(s). Severity: Patient states wound are getting worse. Duration: Patient has had the wound for < 6 weeks prior to presenting for treatment Timing: Pain in wound is Intermittent (comes and goes Context: The wound occurred when the patient had incision and drainage of a hematoma of the right thigh Modifying Factors: Other treatment(s) tried include:washing with dilute hydrogen peroxide Associated Signs and Symptoms: Patient reports having increase discharge. HPI Description: 56 year old female who was admitted to the hospital on 524 and kept overnight for a right hip hematoma which an incision and drainage was done in the OR. inpatient she was treated with IV antibiotics( vancomycin and Ancef) and discharged home on Bactrim DS. the patient has several comorbidities including a former smoker who quit in January of this year and was also a heavy drinker in the past. past medical  history significant for COPD, cirrhosis of the liver, arthritis, tobacco abuse and alcohol abuse. 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 The patient has given up cigarettes in January of this year but smokes E cigarette since then. She has given up a heavy alcohol intake for the last 7 years Electronic Signature(s) Signed: 12/29/2016 11:16:43 AM By: Evlyn Kanner MD, FACS Previous Signature: 12/29/2016 10:57:42 AM Version By: Meyer Russel  Glory Buff MD, FACS Entered By: Evlyn Kanner on 12/29/2016 11:16:42 Bornhorst, Nicki [name] ([id number]) -------------------------------------------------------------------------------- Physical Exam Details Patient Name: [name], [name] Date of Service: 12/29/2016 10:30 AM Medical Record Number: [id number] Patient Account Number: [id number] Date of Birth/Sex: 05/18/1961 (56 y.o. Female) Treating RN: Phillis Haggis Primary Care Provider: Doristine Mango Other Clinician: Referring Provider: Amador Cunas Treating Provider/Extender: Rudene Re in Treatment: 0 Constitutional . Pulse regular. Respirations normal and unlabored. Afebrile. . Eyes Nonicteric. Reactive to light. Ears, Nose, Mouth, and Throat Lips, teeth, and gums WNL.Marland Kitchen Moist mucosa without lesions. Neck supple and nontender. No palpable supraclavicular or cervical adenopathy. Normal sized without goiter. Respiratory WNL. No retractions.. Cardiovascular Pedal Pulses WNL. No clubbing, cyanosis or edema. Chest Breasts symmetical and no nipple discharge.. Breast tissue WNL, no masses, lumps, or tenderness.. Gastrointestinal (GI) Abdomen without masses or tenderness.. No liver or spleen enlargement or tenderness.. Lymphatic No adneopathy. No adenopathy. No adenopathy. Musculoskeletal Adexa without tenderness or enlargement.. Digits and nails w/o clubbing, cyanosis, infection, petechiae, ischemia, or inflammatory conditions.. Integumentary (Hair,  Skin) No suspicious lesions. No crepitus or fluctuance. No peri-wound warmth or erythema. No masses.Marland Kitchen Psychiatric Judgement and insight Intact.. No evidence of depression, anxiety, or agitation.. Notes open wound on the right superior lateral thigh where the hematoma was evacuated recently. She has good subcutaneous debris which I removed with a sharp toothed forcep and minimal bleeding controlled with pressure. Electronic Signature(s) Signed: 12/29/2016 11:17:35 AM By: Evlyn Kanner MD, FACS Entered By: Evlyn Kanner on 12/29/2016 11:17:34 Curfman, Nicki [name] ([id number]) Cuadras, Nicki [name] ([id number]) -------------------------------------------------------------------------------- Physician Orders Details Patient Name: Danielle Seen B. Date of Service: 12/29/2016 10:30 AM Medical Record Number: [id number] Patient Account Number: [id number] Date of Birth/Sex: 21-Sep-1960 (56 y.o. Female) Treating RN: Ashok Cordia, Debi Primary Care Provider: Doristine Mango Other Clinician: Referring Provider: Amador Cunas Treating Provider/Extender: Rudene Re in Treatment: 0 Verbal / Phone Orders: Yes Clinician: Pinkerton, Debi Read Back and Verified: Yes Diagnosis Coding Wound Cleansing Wound #1 Right Ischium o Clean wound with Normal Saline. o Cleanse wound with mild soap and water o May Shower, gently pat wound dry prior to applying new dressing. Anesthetic Wound #1 Right Ischium o Topical Lidocaine 4% cream applied to wound bed prior to debridement Skin Barriers/Peri-Wound Care Wound #1 Right Ischium o Skin Prep Primary Wound Dressing Wound #1 Right Ischium o Aquacel Ag - rope Secondary Dressing Wound #1 Right Ischium o Dry Gauze o Non-adherent pad - telfa island Dressing Change Frequency Wound #1 Right Ischium o Change dressing every day. Follow-up Appointments Wound #1 Right Ischium o Return Appointment in 1 week. Edema Control Wound #1 Right  Ischium o Elevate legs to the level of the heart and pump ankles as often as possible Moure, Jamilex B. ([id number]) Additional Orders / Instructions Wound #1 Right Ischium o Stop Smoking o Increase protein intake. Medications-please add to medication list. Wound #1 Right Ischium o Other: - Vitamin C, Zinc, MVI Electronic Signature(s) Signed: 12/29/2016 3:10:48 PM By: Evlyn Kanner MD, FACS Signed: 12/29/2016 3:47:26 PM By: Alejandro Mulling Entered By: Alejandro Mulling on 12/29/2016 11:09:22 Bibb, Nicki [name] ([id number]) -------------------------------------------------------------------------------- Problem List Details Patient Name: [name], [name] Date of Service: 12/29/2016 10:30 AM Medical Record Number: [id number] Patient Account Number: [id number] Date of Birth/Sex: [birth year] (56 y.o. 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 Date of Service: 12/29/2016 10:30 AM Medical Record Number: [id number] Patient Account Number: [id number] Date of Birth/Sex: 17-Feb-1961 (56 y.o. Female) Treating RN: Ashok Cordia, Debi Primary Care  Provider: Doristine Mango Other Clinician: Referring Provider: Amador Cunas Treating Provider/Extender: Rudene Re in Treatment: 0 Subjective Chief Complaint Information obtained from Patient Patient presents to the wound care center with open non-healing surgical wound to the right upper thigh which she's had for about 6 weeks and now History of Present Illness (HPI) The following HPI elements were documented for the patient's wound: Location: right upper thigh Quality: Patient reports experiencing a dull pain to affected area(s). Severity: Patient states wound are getting worse. Duration: Patient has had the wound for < 6 weeks prior to presenting for treatment Timing: Pain in wound is Intermittent (comes and goes Context: The wound occurred when the patient had incision and drainage of a hematoma of the right thigh Modifying Factors: Other treatment(s) tried include:washing with dilute hydrogen peroxide Associated Signs and Symptoms: Patient reports having increase discharge. 56 year old female who was admitted to the hospital on 524 and kept overnight for a right hip hematoma which an incision and drainage was done in the OR. inpatient she was treated with IV antibiotics ( vancomycin and Ancef) and discharged home on Bactrim DS. the patient has several comorbidities including a former smoker who quit in January of this year and was also a heavy drinker in the past. past medical history significant for COPD, cirrhosis of the liver, arthritis, tobacco abuse and alcohol abuse. 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 The patient has given up cigarettes in January of this year but smokes E cigarette since then. She has given up a heavy alcohol intake for the last 7 years Wound History Patient presents with 1 open wound that has been present for approximately 2 weeks. Patient has been treating wound in the following manner: peroxide  and water dry gauze. Laboratory tests have not been performed in the last month. Patient reportedly has not tested positive for an antibiotic resistant organism. Patient reportedly has not tested positive for osteomyelitis. Patient reportedly has not had testing performed to evaluate circulation in the legs. Patient experiences the following problems associated with their wounds: swelling. Patient History Information obtained from Patient. MABLE, DARA ([id number]) Allergies Chantix, codeine, Vicodin Family History Cancer - Mother, Father, Maternal Grandparents, Diabetes - Mother, Hypertension - Mother, No family history of Heart Disease, Hereditary Spherocytosis, Kidney Disease, Lung Disease, Seizures, Stroke, Thyroid Problems, Tuberculosis. Social History Current every day smoker - e-cigarette, Marital Status - Married, Drug Use - No History, Caffeine Use - Daily. Medical History Respiratory Patient has history of Chronic Obstructive Pulmonary Disease (COPD) Cardiovascular Patient has history of Hypertension Gastrointestinal Patient has history of Cirrhosis Musculoskeletal Patient has history of Osteoarthritis Review of Systems (ROS) Constitutional Symptoms (General Health) The patient has no complaints or symptoms. Eyes The patient has no complaints or symptoms. Ear/Nose/Mouth/Throat The patient has no complaints or symptoms. Hematologic/Lymphatic The patient has no complaints or symptoms. Cardiovascular heart murmur Gastrointestinal gerd Endocrine The patient has no complaints  or symptoms. Genitourinary frequent uti's Immunological The patient has no complaints or symptoms. Integumentary (Skin) The patient has no complaints or symptoms. Neurologic The patient has no complaints or symptoms. Oncologic The patient has no complaints or symptoms. Psychiatric depression Virginia RochesterWORKMAN, Eloyce B. ([id number]) Medications sulfamethoxazole 800 mg-trimethoprim 160 mg  tablet oral 1 1 tablet oral every 12 hours oxycodone 5 mg tablet oral 1 1 tablet oral every six hours as needed atorvastatin 10 mg tablet oral 1 1 tablet oral daily lisinopril 5 mg tablet oral 1 1 tablet oral daily Combivent Respimat 20 mcg-100 mcg/actuation solution for inhalation inhalation 1 1 mist inhalation four times daily as needed fluticasone 250 mcg-salmeterol 50 mcg/dose blistr powdr for inhalation inhalation 1 1 blister with device inhalation every 12 hours nadolol 20 mg tablet oral 2 2 tablets oral (60 mg.) once daily meloxicam 15 mg tablet oral 1 1 tablet oral daily pantoprazole 40 mg tablet,delayed release oral 1 1 tablet,delayed release (DR/EC) oral daily vitamin B complex tablet oral 1 1 tablet oral daily cholecalciferol (vitamin D3) 5,000 unit tablet oral 1 1 tablet oral daily Objective Constitutional Pulse regular. Respirations normal and unlabored. Afebrile. Vitals Time Taken: 10:33 AM, Height: 63 in, Source: Stated, Weight: 187.3 lbs, Source: Measured, BMI: 33.2, Temperature: 97.9 F, Pulse: 62 bpm, Respiratory Rate: 20 breaths/min, Blood Pressure: 160/53 mmHg. Eyes Nonicteric. Reactive to light. Ears, Nose, Mouth, and Throat Lips, teeth, and gums WNL.Marland Kitchen. Moist mucosa without lesions. Neck supple and nontender. No palpable supraclavicular or cervical adenopathy. Normal sized without goiter. Respiratory WNL. No retractions.. Cardiovascular Pedal Pulses WNL. No clubbing, cyanosis or edema. Chest Virginia RochesterWORKMAN, Quantia B. ([id number]) Breasts symmetical and no nipple discharge.. Breast tissue WNL, no masses, lumps, or tenderness.. Gastrointestinal (GI) Abdomen without masses or tenderness.. No liver or spleen enlargement or tenderness.. Lymphatic No adneopathy. No adenopathy. No adenopathy. Musculoskeletal Adexa without tenderness or enlargement.. Digits and nails w/o clubbing, cyanosis, infection, petechiae, ischemia, or inflammatory conditions.Marland Kitchen. Psychiatric Judgement  and insight Intact.. No evidence of depression, anxiety, or agitation.. General Notes: open wound on the right superior lateral thigh where the hematoma was evacuated recently. She has good subcutaneous debris which I removed with a sharp toothed forcep and minimal bleeding controlled with pressure. Integumentary (Hair, Skin) No suspicious lesions. No crepitus or fluctuance. No peri-wound warmth or erythema. No masses.. Wound #1 status is Open. Original cause of wound was Surgical Injury. The wound is located on the Right Ischium. The wound measures 2cm length x 1cm width x 0.6cm depth; 1.571cm^2 area and 0.942cm^3 volume. There is no tunneling noted, however, there is undermining starting at 11:00 and ending at 1:00 with a maximum distance of 2cm. There is a large amount of serosanguineous drainage noted. The wound margin is distinct with the outline attached to the wound base. There is large (67-100%) red granulation within the wound bed. There is a small (1-33%) amount of necrotic tissue within the wound bed including Adherent Slough. Periwound temperature was noted as No Abnormality. The periwound has tenderness on palpation. 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 ([id number]) This 56 year old patient who is a reformed alcoholic and has given up and nicotine cigarettes, but smokes e-cigarettes quite a bit, has come back with a nonhealing wound to the right hip which she's had for about 6 weeks. after review  I have recommended: 1. Packing the wound with silver alginate rope daily and covering with a bordered foam. 2. Getting her insurance clearance for a snap vacuum system 3. Adequate protein, vitamin A, vitamin C and  zinc 4. I have spent 3 minutes discussing with her the need to completely give up smoking the cigarettes and any type of nicotine and I have discussed the risk benefits and alternatives 5. Regular visits to the wound center Procedures Wound #1 Pre-procedure diagnosis of Wound #1 is an Open Surgical Wound located on the Right Ischium . There was a Skin/Subcutaneous Tissue Debridement ([id number]) debridement with total area of 2 sq cm performed by Evlyn Kanner, MD. with the following instrument(s): Forceps to remove Viable and Non-Viable tissue/material including Exudate, Fibrin/Slough, and Subcutaneous after achieving pain control using Lidocaine 4% Topical Solution. A time out was conducted at 11:05, prior to the start of the procedure. A Minimum amount of bleeding was controlled with Pressure. The procedure was tolerated well with a pain level of 0 throughout and a pain level of 0 following the procedure. Post Debridement Measurements: 2cm length x 1cm width x 0.6cm depth; 0.942cm^3 volume. Character of Wound/Ulcer Post Debridement requires further debridement. Post procedure Diagnosis Wound #1: Same as Pre-Procedure Plan Wound Cleansing: Wound #1 Right Ischium: Clean wound with Normal Saline. Cleanse wound with mild soap and water May Shower, gently pat wound dry prior to applying new dressing. Anesthetic: Wound #1 Right Ischium: Topical Lidocaine 4% cream applied to wound bed prior to debridement Skin Barriers/Peri-Wound Care: Wound #1 Right Ischium: Skin Prep Primary Wound Dressing: Wound #1 Right Ischium: Aquacel Ag - rope Secondary Dressing: Wound #1 Right Ischium: Dry Gauze Non-adherent pad - telfa [address], Karesha B. ([id number]) Dressing Change Frequency: Wound #1 Right Ischium: Change dressing every day. Follow-up Appointments: Wound #1 Right Ischium: Return Appointment in 1 week. Edema Control: Wound #1 Right Ischium: Elevate legs to the level of the  heart and pump ankles as often as possible Additional Orders / Instructions: Wound #1 Right Ischium: Stop Smoking Increase protein intake. Medications-please add to medication list.: Wound #1 Right Ischium: Other: - Vitamin C, Zinc, MVI This 56 year old patient who is a reformed alcoholic and has given up and nicotine cigarettes, but smokes e-cigarettes quite a bit, has come back with a nonhealing wound to the right hip which she's had for about 6 weeks. after review I have recommended: 1. Packing the wound with silver alginate rope daily and covering with a bordered foam. 2. Getting her insurance clearance for a snap vacuum system 3. Adequate protein, vitamin A, vitamin C and zinc 4. I have spent 3 minutes discussing with her the need to completely give up smoking the cigarettes and any type of nicotine and I have discussed the risk benefits and alternatives 5. Regular visits to the wound center Electronic Signature(s) Signed: 12/29/2016 11:20:03 AM By: Evlyn Kanner MD, FACS Entered By: Evlyn Kanner on 12/29/2016 11:20:02 [name] ([id number]) -------------------------------------------------------------------------------- ROS/PFSH Details Patient Name: Danielle Seen B. Date of Service: 12/29/2016 10:30 AM Medical Record Number: [id number] Patient Account Number: [id number] Date of Birth/Sex: 1961-01-03 (56 y.o. Female) Treating RN: Ashok Cordia, Debi Primary Care Provider: Doristine Mango Other Clinician: Referring Provider: Amador Cunas Treating Provider/Extender: Rudene Re in Treatment: 0 Information Obtained From Patient Wound History Do you currently have one or more open woundso Yes How many open wounds do you currently haveo 1 Approximately how long have you had your woundso 2 weeks How have you been treating  your wound(s) until nowo peroxide and water dry gauze Has your wound(s) ever healed and then re-openedo No Have you had any lab work done in  the past montho No Have you tested positive for an antibiotic resistant organism (MRSA, No VRE)o Have you tested positive for osteomyelitis (bone infection)o No Have you had any tests for circulation on your legso No Have you had other problems associated with your woundso Swelling Constitutional Symptoms (General Health) Complaints and Symptoms: No Complaints or Symptoms Eyes Complaints and Symptoms: No Complaints or Symptoms Ear/Nose/Mouth/Throat Complaints and Symptoms: No Complaints or Symptoms Hematologic/Lymphatic Complaints and Symptoms: No Complaints or Symptoms Respiratory Medical History: Positive for: Chronic Obstructive Pulmonary Disease (COPD) Cardiovascular Pinsky, Ettel B. ([id number]) Complaints and Symptoms: Review of System Notes: heart murmur Medical History: Positive for: Hypertension Gastrointestinal Complaints and Symptoms: Review of System Notes: gerd Medical History: Positive for: Cirrhosis Endocrine Complaints and Symptoms: No Complaints or Symptoms Genitourinary Complaints and Symptoms: Review of System Notes: frequent uti's Immunological Complaints and Symptoms: No Complaints or Symptoms Integumentary (Skin) Complaints and Symptoms: No Complaints or Symptoms Musculoskeletal Medical History: Positive for: Osteoarthritis Neurologic Complaints and Symptoms: No Complaints or Symptoms Oncologic Complaints and Symptoms: No Complaints or Symptoms Iglesia, Yamil B. ([id number]) Psychiatric Complaints and Symptoms: Review of System Notes: depression Immunizations Pneumococcal Vaccine: Received Pneumococcal Vaccination: Yes Family and Social History Cancer: Yes - Mother, Father, Maternal Grandparents; Diabetes: Yes - Mother; Heart Disease: No; Hereditary Spherocytosis: No; Hypertension: Yes - Mother; Kidney Disease: No; Lung Disease: No; Seizures: No; Stroke: No; Thyroid Problems: No; Tuberculosis: No; Current every day smoker -  e-cigarette; Marital Status - Married; Drug Use: No History; Caffeine Use: Daily; Financial Concerns: No; Food, Clothing or Shelter Needs: No; Support System Lacking: No; Transportation Concerns: No; Advanced Directives: No; Patient does not want information on Advanced Directives; Do not resuscitate: No; Living Will: Yes (Not Provided); Medical Power of Attorney: Yes - Ethelene Browns (husband) (Not Provided) Physician Affirmation I have reviewed and agree with the above information. Electronic Signature(s) Signed: 12/29/2016 3:10:48 PM By: Evlyn Kanner MD, FACS Signed: 12/29/2016 3:47:26 PM By: Alejandro Mulling Entered By: Evlyn Kanner on 12/29/2016 10:58:17 Laube, Nicki [name] ([id number]) -------------------------------------------------------------------------------- SuperBill Details Patient Name: [name], Danielle Lund B. Date of Service: 12/29/2016 Medical Record Number: [id number] Patient Account Number: [id number] Date of Birth/Sex: 1961/04/06 (56 y.o. 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 Disruption of external operation (surgical) wound, not elsewhere classified, initial T81.31XA encounter 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 dependence, cigarettes, with other nicotine-induced disorders Facility Procedures CPT4: Description Modifier Quantity Code [id number] 99213 - WOUND CARE VISIT-LEV 3 EST PT 1 CPT4: 32440102 11042 - DEB SUBQ TISSUE 20 SQ CM/< 1 ICD-10 Description Diagnosis 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 T81.31XA Disruption of external operation (surgical) wound, not elsewhere classified, initial  encounter 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 dependence, cigarettes, with other nicotine-induced disorders CPT4:  72536644 99406-SMOKING CESSATION 3-10MINS 1 ICD-10 Description Diagnosis 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 (  [id number]) Electronic Signature(s) Signed: 12/29/2016 3:10:48 PM By: Evlyn Kanner MD, FACS Signed: 12/29/2016 3:47:26 PM By: Alejandro Mulling Previous Signature: 12/29/2016 11:20:43 AM Version By: Evlyn Kanner MD, FACS Entered By: Alejandro Mulling on 12/29/2016 11:45:35

## 2016-12-29 NOTE — Progress Notes (Signed)
[name], [name] ([id number]) Visit Report for 12/29/2016 Abuse/Suicide Risk Screen Details Patient Name: [name], [name] Date of Service: 12/29/2016 10:30 AM Medical Record Number: [id number] Patient Account Number: [id number] Date of Birth/Sex: [birth year] (56 y.o. Female) Treating RN: Ashok CordiaPinkerton, Debi Primary Care Sachin Ferencz: WHITE, Lanora ManisELIZABETH Other Clinician: Referring Kamala Kolton: Amador CunasGAINES, THOMAS Treating Joyelle Siedlecki/Extender: Rudene ReBritto, Errol Weeks in Treatment: 0 Abuse/Suicide Risk Screen Items Answer ABUSE/SUICIDE RISK SCREEN: Has anyone close to you tried to hurt or harm you recentlyo No Do you feel uncomfortable with anyone in your familyo No Has anyone forced you do things that you didnot want to doo No Do you have any thoughts of harming yourselfo No Patient displays signs or symptoms of abuse and/or neglect. No Electronic Signature(s) Signed: 12/29/2016 3:47:26 PM By: Alejandro MullingPinkerton, Debra Entered By: Alejandro MullingPinkerton, Debra on 12/29/2016 10:42:06 Metzer, [name] ([id number]) -------------------------------------------------------------------------------- Activities of Daily Living Details Patient Name: [name], [name] Date of Service: 12/29/2016 10:30 AM Medical Record Number: [id number] Patient Account Number: [id number] Date of Birth/Sex: [birth year] (56 y.o. Female) Treating RN: Ashok CordiaPinkerton, Debi Primary Care Kelly Eisler: Doristine MangoWHITE, ELIZABETH Other Clinician: Referring Lesia Monica: Amador CunasGAINES, THOMAS Treating Sharen Youngren/Extender: Rudene ReBritto, Errol Weeks in Treatment: 0 Activities of Daily Living Items Answer Activities of Daily Living (Please select one for each item) Drive Automobile Completely Able Take Medications Completely Able Use Telephone Completely Able Care for Appearance Completely Able Use Toilet Completely Able Bath / Shower Completely Able Dress Self Completely Able Feed Self Completely Able Walk Completely Able Get In / Out Bed Completely Able Housework Completely Able Prepare Meals  Completely Able Handle Money Completely Able Shop for Self Completely Able Electronic Signature(s) Signed: 12/29/2016 3:47:26 PM By: Alejandro MullingPinkerton, Debra Entered By: Alejandro MullingPinkerton, Debra on 12/29/2016 10:42:24 [name], [name] ([id number]) -------------------------------------------------------------------------------- Education Assessment Details Patient Name: [name], [name] Date of Service: 12/29/2016 10:30 AM Medical Record Number: [id number] Patient Account Number: [id number] Date of Birth/Sex: [birth year] (56 y.o. Female) Treating RN: Ashok CordiaPinkerton, Debi Primary Care Azim Gillingham: Doristine MangoWHITE, ELIZABETH Other Clinician: Referring Jet Armbrust: Amador CunasGAINES, THOMAS Treating Rosabelle Jupin/Extender: Rudene ReBritto, Errol Weeks in Treatment: 0 Primary Learner Assessed: Patient Learning Preferences/Education Level/Primary Language Learning Preference: Explanation, Printed Material Highest Education Level: High School Preferred Language: English Cognitive Barrier Assessment/Beliefs Language Barrier: No Translator Needed: No Memory Deficit: No Emotional Barrier: No Cultural/Religious Beliefs Affecting Medical No Care: Physical Barrier Assessment Impaired Vision: No Impaired Hearing: No Decreased Hand dexterity: No Knowledge/Comprehension Assessment Knowledge Level: Medium Comprehension Level: Medium Ability to understand written Medium instructions: Ability to understand verbal Medium instructions: Motivation Assessment Anxiety Level: Calm Cooperation: Cooperative Education Importance: Acknowledges Need Interest in Health Problems: Asks Questions Perception: Coherent Willingness to Engage in Self- Medium Management Activities: Readiness to Engage in Self- Medium Management Activities: Electronic Signature(s) [name], [name] ([id number]) Signed: 12/29/2016 3:47:26 PM By: Alejandro MullingPinkerton, Debra Entered By: Alejandro MullingPinkerton, Debra on 12/29/2016 10:44:03 [name], [name]  ([id number]) -------------------------------------------------------------------------------- Fall Risk Assessment Details Patient Name: [name], [name] Date of Service: 12/29/2016 10:30 AM Medical Record Number: [id number] Patient Account Number: [id number] Date of Birth/Sex: [birth year] (56 y.o. Female) Treating RN: Ashok CordiaPinkerton, Debi Primary Care Concha Sudol: WHITE, Lanora ManisELIZABETH Other Clinician: Referring Bisma Klett: Amador CunasGAINES, THOMAS Treating Amair Shrout/Extender: Rudene ReBritto, Errol Weeks in Treatment: 0 Fall Risk Assessment Items Have you had 2 or more falls in the last 12 monthso 0 No Have you had any fall that resulted in injury in the last 12 monthso 0 No FALL RISK ASSESSMENT: History of falling - immediate or within 3 months 0 No Secondary diagnosis 15 Yes Ambulatory aid None/bed rest/wheelchair/nurse 0 No Crutches/cane/walker 0 No Furniture  0 No IV Access/Saline Lock 0 No Gait/Training Normal/bed rest/immobile 0 No Weak 0 No Impaired 0 No Mental Status Oriented to own ability 0 Yes Electronic Signature(s) Signed: 12/29/2016 3:47:26 PM By: Alejandro Mulling Entered By: Alejandro Mulling on 12/29/2016 10:44:36 [name] ([id number]) -------------------------------------------------------------------------------- Foot Assessment Details Patient Name: [name], [name] Date of Service: 12/29/2016 10:30 AM Medical Record Number: [id number] Patient Account Number: [id number] Date of Birth/Sex: 07-24-1960 (56 y.o. Female) Treating RN: Ashok Cordia, Debi Primary Care Adana Marik: WHITE, Lanora Manis Other Clinician: Referring Enya Bureau: Amador Cunas Treating Jaycee Mckellips/Extender: Rudene Re in Treatment: 0 Foot Assessment Items Site Locations + = Sensation present, - = Sensation absent, C = Callus, U = Ulcer R = Redness, W = Warmth, M = Maceration, PU = Pre-ulcerative lesion F = Fissure, S = Swelling, D = Dryness Assessment Right: Left: Other Deformity: No No Prior Foot Ulcer: No  No Prior Amputation: No No Charcot Joint: No No Ambulatory Status: Gait: Electronic Signature(s) Signed: 12/29/2016 3:47:26 PM By: Alejandro Mulling Entered By: Alejandro Mulling on 12/29/2016 10:44:57 Galant, [name] ([id number]) -------------------------------------------------------------------------------- Nutrition Risk Assessment Details Patient Name: [name], [name] Date of Service: 12/29/2016 10:30 AM Medical Record Number: [id number] Patient Account Number: [id number] Date of Birth/Sex: 1960-09-08 (56 y.o. Female) Treating RN: Ashok Cordia, Debi Primary Care Khyson Sebesta: Doristine Mango Other Clinician: Referring Slevin Gunby: Amador Cunas Treating Milliani Herrada/Extender: Rudene Re in Treatment: 0 Height (in): 63 Weight (lbs): 187.3 Body Mass Index (BMI): 33.2 Nutrition Risk Assessment Items NUTRITION RISK SCREEN: I have an illness or condition that made me change the kind and/or 0 No amount of food I eat I eat fewer than two meals per day 0 No I eat few fruits and vegetables, or milk products 0 No I have three or more drinks of beer, liquor or wine almost every day 0 No I have tooth or mouth problems that make it hard for me to eat 0 No I don't always have enough money to buy the food I need 0 No I eat alone most of the time 0 No I take three or more different prescribed or over-the-counter drugs a 1 Yes day Without wanting to, I have lost or gained 10 pounds in the last six 0 No months I am not always physically able to shop, cook and/or feed myself 0 No Nutrition Protocols Good Risk Protocol Moderate Risk Protocol Electronic Signature(s) Signed: 12/29/2016 3:47:26 PM By: Alejandro Mulling Entered By: Alejandro Mulling on 12/29/2016 10:44:51

## 2017-01-05 ENCOUNTER — Encounter: Payer: Medicare Other | Admitting: Physician Assistant

## 2017-01-05 DIAGNOSIS — L97113 Non-pressure chronic ulcer of right thigh with necrosis of muscle: Secondary | ICD-10-CM | POA: Diagnosis not present

## 2017-01-06 NOTE — Progress Notes (Signed)
Danielle, [name] ([id number]) Visit Report for 01/05/2017 Chief Complaint Document Details Patient Name: [name], [name]. Date of Service: 01/05/2017 12:30 PM Medical Record Number: [id number] Patient Account Number: [id number] Date of Birth/Sex: 12/13/1960 (56 y.o. Female) Treating RN: Ashok Cordia, Debi Primary Care Provider: WHITE, Lanora Manis Other Clinician: Referring Provider: WHITE, Lanora Manis Treating Provider/Extender: Linwood Dibbles, HOYT Weeks in Treatment: 1 Information Obtained from: Patient Chief Complaint Patient presents to the wound care center with open non-healing surgical wound to the right upper thigh Electronic Signature(s) Signed: 01/05/2017 5:09:57 PM By: Lenda Kelp PA-C Entered By: Lenda Kelp on 01/05/2017 14:44:57 Godby, Nicki [name] ([id number]) -------------------------------------------------------------------------------- Debridement Details Patient Name: Danielle Seen B. Date of Service: 01/05/2017 12:30 PM Medical Record Number: [id number] Patient Account Number: [id number] Date of Birth/Sex: August 23, 1960 (56 y.o. Female) Treating RN: Ashok Cordia, Debi Primary Care Provider: WHITE, Lanora Manis Other Clinician: Referring Provider: WHITE, ELIZABETH Treating Provider/Extender: STONE III, HOYT Weeks in Treatment: 1 Debridement Performed for Wound #1 Right Ischium Assessment: Performed By: Physician STONE III, HOYT E., PA-C Debridement: Debridement Pre-procedure Verification/Time Out Yes - 13:10 Taken: Start Time: 13:11 Pain Control: Lidocaine 4% Topical Solution Level: Skin/Subcutaneous Tissue Total Area Debrided (L x 1.5 (cm) x 1.1 (cm) = 1.65 (cm) W): Tissue and other Viable, Non-Viable, Exudate, Fibrin/Slough, Subcutaneous material debrided: Instrument: Curette Bleeding: Minimum Hemostasis Achieved: Pressure End Time: 13:12 Procedural Pain: 0 Post Procedural Pain: 0 Response to Treatment: Procedure was tolerated well Post Debridement  Measurements of Total Wound Length: (cm) 1.5 Width: (cm) 1.1 Depth: (cm) 0.6 Volume: (cm) 0.778 Character of Wound/Ulcer Post Requires Further Debridement Debridement: Post Procedure Diagnosis Same as Pre-procedure Electronic Signature(s) Signed: 01/05/2017 4:29:00 PM By: Alejandro Mulling Signed: 01/05/2017 5:09:57 PM By: Lenda Kelp PA-C Entered By: Alejandro Mulling on 01/05/2017 13:12:05 Provencal, Nicki [name] ([id number]) -------------------------------------------------------------------------------- HPI Details Patient Name: Danielle Seen B. Date of Service: 01/05/2017 12:30 PM Medical Record Number: [id number] Patient Account Number: [id number] Date of Birth/Sex: 1960-09-09 (56 y.o. Female) Treating RN: Ashok Cordia, Debi Primary Care Provider: WHITE, Lanora Manis Other Clinician: Referring Provider: WHITE, ELIZABETH Treating Provider/Extender: STONE III, HOYT Weeks in Treatment: 1 History of Present Illness Location: right upper thigh Quality: Patient reports experiencing a dull pain to affected area(s). Severity: Patient states wound are getting better Duration: Patient has had the wound for < 6 weeks prior to presenting for treatment Timing: Pain in wound is Intermittent (comes and goes Context: The wound occurred when the patient had incision and drainage of a hematoma of the right thigh Modifying Factors: Other treatment(s) tried include:washing with dilute hydrogen peroxide Associated Signs and Symptoms: Patient reports having increase discharge. HPI Description: 56 year old female who was admitted to the hospital on 524 and kept overnight for a right hip hematoma which an incision and drainage was done in the OR. inpatient she was treated with IV antibiotics( vancomycin and Ancef) and discharged home on Bactrim DS. the patient has several comorbidities including a former smoker who quit in January of this year and was also a heavy drinker in the past. past medical history  significant for COPD, cirrhosis of the liver, arthritis, tobacco abuse and alcohol abuse. 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 The patient has given up cigarettes in January of this year but smokes E cigarette since then. She has given up a heavy alcohol intake for the last 7 years 01/05/17 patient's right lateral hip wound appears to be doing fairly well on evaluation today. She shows no signs or evidence of  significant infection and she has been tolerating the dressing changes without complication. She continues to have some depth and undermining in regard to her wound. Today we are going to initiate the SNAP Vac for her. Electronic Signature(s) Signed: 01/05/2017 5:09:57 PM By: Lenda Kelp PA-C Entered By: Lenda Kelp on 01/05/2017 14:45:50 Hogate, Nicki [name] ([id number]) -------------------------------------------------------------------------------- Physical Exam Details Patient Name: Pikus, [name] Date of Service: 01/05/2017 12:30 PM Medical Record Number: [id number] Patient Account Number: [id number] Date of Birth/Sex: 02/24/1961 (56 y.o. Female) Treating RN: Ashok Cordia, Debi Primary Care Provider: WHITE, Lanora Manis Other Clinician: Referring Provider: WHITE, ELIZABETH Treating Provider/Extender: STONE III, HOYT Weeks in Treatment: 1 Constitutional Well-nourished and well-hydrated in no acute distress. Respiratory normal breathing without difficulty. Psychiatric this patient is able to make decisions and demonstrates good insight into disease process. Alert and Oriented x 3. pleasant and cooperative. Notes Patient's wound did have some surface slough noted during evaluation. Sharp debridement was performed which he tolerated well without complication. Otherwise she appeared to have a fairly good wound bed post debridement. Electronic Signature(s) Signed: 01/05/2017 5:09:57 PM By: Lenda Kelp PA-C Entered By: Lenda Kelp on 01/05/2017 14:46:25 Weger, Nicki [name] ([id number]) -------------------------------------------------------------------------------- Physician Orders Details Patient Name: Danielle Seen B. Date of Service: 01/05/2017 12:30 PM Medical Record Number: [id number] Patient Account Number: [id number] Date of Birth/Sex: [birth year] (56 y.o. Female) Treating RN: Ashok Cordia, Debi Primary Care Provider: WHITE, Lanora Manis Other Clinician: Referring Provider: WHITE, Lanora Manis Treating Provider/Extender: Linwood Dibbles, HOYT Weeks in Treatment: 1 Verbal / Phone Orders: Yes Clinician: Pinkerton, Debi Read Back and Verified: Yes Diagnosis Coding Wound Cleansing Wound #1 Right Ischium o Clean wound with Normal Saline. o Clean wound with wound cleanser. Anesthetic Wound #1 Right Ischium o Topical Lidocaine 4% cream applied to wound bed prior to debridement Skin Barriers/Peri-Wound Care Wound #1 Right Ischium o Skin Prep Dressing Change Frequency Wound #1 Right Ischium o Change dressing every week Follow-up Appointments Wound #1 Right Ischium o Return Appointment in 1 week. Edema Control Wound #1 Right Ischium o Elevate legs to the level of the heart and pump ankles as often as possible Additional Orders / Instructions Wound #1 Right Ischium o Stop Smoking o Increase protein intake. Negative Pressure Wound Therapy Wound #1 Right Ischium o Other: - Snap Vac Speights, Shaunte B. ([id number]) Medications-please add to medication list. Wound #1 Right Ischium o Other: - Vitamin C, Zinc, MVI Notes We will initiate the SNAP Vac treatment today. We will see her for reevaluation in one week to see how things are doing. If anything worsens in the interim or she has any complication she will contact our office for additional recommendations. Otherwise hopefully we will see some improvement with the vac. Electronic Signature(s) Signed: 01/05/2017 5:09:57 PM By: Lenda Kelp  PA-C Entered By: Lenda Kelp on 01/05/2017 14:47:16 Myren, Nicki [name] ([id number]) -------------------------------------------------------------------------------- Problem List Details Patient Name: Danielle Seen B. Date of Service: 01/05/2017 12:30 PM Medical Record Number: [id number] Patient Account Number: [id number] Date of Birth/Sex: [birth year] (56 y.o. 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 Date of Service: 01/05/2017 12:30 PM Medical Record Number: [id number] Patient Account Number: [id number] Date of Birth/Sex: 03-17-1961 (56 y.o. Female) Treating RN: Ashok Cordia, Debi Primary Care Provider: WHITE, Lanora Manis Other Clinician: Referring Provider: WHITE, Lanora Manis Treating Provider/Extender: Linwood Dibbles, HOYT Weeks in Treatment: 1 Subjective Chief Complaint Information obtained from Patient Patient presents to the wound care center with open non-healing surgical wound to the right upper thigh History of Present Illness (HPI) The following HPI elements were documented for the patient's wound: Location: right upper  thigh Quality: Patient reports experiencing a dull pain to affected area(s). Severity: Patient states wound are getting better Duration: Patient has had the wound for < 6 weeks prior to presenting for treatment Timing: Pain in wound is Intermittent (comes and goes Context: The wound occurred when the patient had incision and drainage of a hematoma of the right thigh Modifying Factors: Other treatment(s) tried include:washing with dilute hydrogen peroxide Associated Signs and Symptoms: Patient reports having increase discharge. 56 year old female who was admitted to the hospital on 524 and kept overnight for a right hip hematoma which an incision and drainage was done in the OR. inpatient she was treated with IV antibiotics ( vancomycin and Ancef) and discharged home on Bactrim DS. the patient has several comorbidities including a former smoker who quit in January of this year and was also a heavy drinker in the past. past medical history significant for COPD, cirrhosis of the liver, arthritis, tobacco abuse and alcohol abuse. 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 The patient has given up cigarettes in January of this year but smokes E cigarette since then. She has given up a heavy alcohol intake for the last 7 years 01/05/17 patient's right lateral hip wound appears to be doing fairly well on evaluation today. She shows no signs or evidence of significant infection and she has been tolerating the dressing changes without complication. She continues to have some depth and undermining in regard to her wound. Today we are going to initiate the SNAP Vac for her. Moctezuma, Dossie B. ([id number]) Objective Constitutional Well-nourished and well-hydrated in no acute distress. Vitals Time Taken: 12:45 PM, Height: 63 in, Weight: 187.3 lbs, BMI: 33.2, Temperature: 97.8 F, Pulse: 57 bpm, Respiratory Rate: 20 breaths/min, Blood Pressure: 136/50  mmHg. Respiratory normal breathing without difficulty. Psychiatric this patient is able to make decisions and demonstrates good insight into disease process. Alert and Oriented x 3. pleasant and cooperative. General Notes: Patient's wound did have some surface slough noted during evaluation. Sharp debridement was performed which he tolerated well without complication. Otherwise she appeared to have a fairly good wound bed post debridement. Integumentary (Hair, Skin) Wound #1 status is Open. Original cause of wound was Surgical Injury. The wound is located on the Right Ischium. The wound measures 1.5cm length x 1.1cm width x 0.6cm depth; 1.296cm^2 area and 0.778cm^3 volume. There is no tunneling noted, however, there is undermining starting at 1:00 and ending at :00 with a maximum distance of 1.6cm. There is a large amount of serosanguineous drainage noted. The wound margin is distinct with the outline attached to the wound base. There is large (67-100%) red granulation within the wound bed. There is a small (1-33%) amount of necrotic tissue within the wound bed including Adherent Slough. Periwound temperature was noted as No  Abnormality. The periwound has tenderness on palpation. 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 ([id number]) Procedures Wound #1 Pre-procedure diagnosis of Wound #1 is an Open Surgical Wound located on the Right Ischium . There was a Skin/Subcutaneous Tissue Debridement ([id number]) debridement with total area of 1.65 sq cm performed by STONE III, HOYT E., PA-C. with the following instrument(s): Curette to remove Viable and Non-Viable tissue/material including Exudate, Fibrin/Slough, and  Subcutaneous after achieving pain control using Lidocaine 4% Topical Solution. A time out was conducted at 13:10, prior to the start of the procedure. A Minimum amount of bleeding was controlled with Pressure. The procedure was tolerated well with a pain level of 0 throughout and a pain level of 0 following the procedure. Post Debridement Measurements: 1.5cm length x 1.1cm width x 0.6cm depth; 0.778cm^3 volume. Character of Wound/Ulcer Post Debridement requires further debridement. Post procedure Diagnosis Wound #1: Same as Pre-Procedure Plan Wound Cleansing: Wound #1 Right Ischium: Clean wound with Normal Saline. Clean wound with wound cleanser. Anesthetic: Wound #1 Right Ischium: Topical Lidocaine 4% cream applied to wound bed prior to debridement Skin Barriers/Peri-Wound Care: Wound #1 Right Ischium: Skin Prep Dressing Change Frequency: Wound #1 Right Ischium: Change dressing every week Follow-up Appointments: Wound #1 Right Ischium: Return Appointment in 1 week. Edema Control: Wound #1 Right Ischium: Elevate legs to the level of the heart and pump ankles as often as possible Additional Orders / Instructions: Wound #1 Right Ischium: Stop Smoking Increase protein intake. Negative Pressure Wound Therapy: Wound #1 Right Ischium: Other: - Snap Vac Medications-please add to medication list.: Wound #1 Right Ischium: Schubach, Avalie B. ([id number]) Other: - Vitamin C, Zinc, MVI General Notes: We will initiate the SNAP Vac treatment today. We will see her for reevaluation in one week to see how things are doing. If anything worsens in the interim or she has any complication she will contact our office for additional recommendations. Otherwise hopefully we will see some improvement with the vac. Electronic Signature(s) Signed: 01/05/2017 5:09:57 PM By: Lenda KelpStone III, Hoyt PA-C Entered By: Lenda KelpStone III, Hoyt on 01/05/2017 14:47:27 Menees, Nicki GuadalajaraJANET B.  ([id number]) -------------------------------------------------------------------------------- SuperBill Details Patient Name: Danielle SeenWORKMAN, Karry B. Date of Service: 01/05/2017 Medical Record Number: [id number] Patient Account Number: [id number] Date of Birth/Sex: 28-Dec-1960 (56 y.o. 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 Disruption of external operation (surgical) wound, not elsewhere classified, initial T81.31XA encounter 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 dependence, cigarettes, with other nicotine-induced disorders Facility Procedures CPT4 Code Description: [id number] 11042 - DEB SUBQ TISSUE 20 SQ CM/< ICD-10 Description Diagnosis L97.113 Non-pressure chronic ulcer of right thigh with necro Modifier: sis of muscle Quantity: 1 Physician Procedures CPT4 Code Description: [id number] 11042 - WC PHYS SUBQ TISS 20 SQ CM ICD-10 Description Diagnosis L97.113 Non-pressure chronic ulcer of right thigh with necro Modifier: sis of muscle Quantity: 1 Electronic Signature(s) Signed: 01/05/2017 5:09:57 PM By: Lenda KelpStone III, Hoyt PA-C Entered By: Lenda KelpStone III, Hoyt on 01/05/2017 14:47:52

## 2017-01-06 NOTE — Progress Notes (Signed)
[name], Ita B. ([id number]) Visit Report for 01/05/2017 Arrival Information Details Patient Name: [name], [name] Date of Service: 01/05/2017 12:30 PM Medical Record Number: [id number] Patient Account Number: [id number] Date of Birth/Sex: July 27, 1960 (56 y.o. Female) Treating RN: Ashok CordiaPinkerton, Debi Primary Care Kollins Fenter: WHITE, Lanora ManisELIZABETH Other Clinician: Referring Merrit Friesen: WHITE, Lanora ManisELIZABETH Treating Launa Goedken/Extender: Linwood DibblesSTONE III, HOYT Weeks in Treatment: 1 Visit Information History Since Last Visit All ordered tests and consults were completed: No Patient Arrived: Ambulatory Added or deleted any medications: No Arrival Time: 12:44 Any new allergies or adverse reactions: No Accompanied By: husband Had a fall or experienced change in No Transfer Assistance: None activities of daily living that may affect Patient Identification Verified: Yes risk of falls: Secondary Verification Process Yes Signs or symptoms of abuse/neglect since last No Completed: visito Patient Requires Transmission-Based No Hospitalized since last visit: No Precautions: Has Dressing in Place as Prescribed: Yes Patient Has Alerts: No Pain Present Now: No Electronic Signature(s) Signed: 01/05/2017 4:29:00 PM By: Alejandro MullingPinkerton, Debra Entered By: Alejandro MullingPinkerton, Debra on 01/05/2017 12:45:34 Dendinger, [name] ([id number]) -------------------------------------------------------------------------------- Encounter Discharge Information Details Patient Name: [name], [name] Date of Service: 01/05/2017 12:30 PM Medical Record Number: [id number] Patient Account Number: [id number] Date of Birth/Sex: July 27, 1960 (56 y.o. Female) Treating RN: Ashok CordiaPinkerton, Debi Primary Care Jorian Willhoite: WHITE, Lanora ManisELIZABETH Other Clinician: Referring Tolbert Matheson: WHITE, Lanora ManisELIZABETH Treating Jess Toney/Extender: Linwood DibblesSTONE III, HOYT Weeks in Treatment: 1 Encounter Discharge Information Items Discharge Pain Level: 0 Discharge Condition: Stable Ambulatory Status:  Ambulatory Discharge Destination: Home Transportation: Private Auto Accompanied By: husband Schedule Follow-up Appointment: Yes Medication Reconciliation completed and provided to Patient/Care No Bienvenido Proehl: Provided on Clinical Summary of Care: 01/05/2017 Form Type Recipient Paper Patient JW Electronic Signature(s) Signed: 01/05/2017 4:29:00 PM By: Alejandro MullingPinkerton, Debra Previous Signature: 01/05/2017 1:23:38 PM Version By: Gwenlyn PerkingMoore, Shelia Entered By: Alejandro MullingPinkerton, Debra on 01/05/2017 13:29:29 [name], [name] ([id number]) -------------------------------------------------------------------------------- Lower Extremity Assessment Details Patient Name: [name], [name] Date of Service: 01/05/2017 12:30 PM Medical Record Number: [id number] Patient Account Number: [id number] Date of Birth/Sex: July 27, 1960 (56 y.o. Female) Treating RN: Phillis HaggisPinkerton, Debi Primary Care Reinette Cuneo: Doristine MangoWHITE, ELIZABETH Other Clinician: Referring Latronda Spink: WHITE, Lanora ManisELIZABETH Treating Natsumi Whitsitt/Extender: Linwood DibblesSTONE III, HOYT Weeks in Treatment: 1 Electronic Signature(s) Signed: 01/05/2017 4:29:00 PM By: Alejandro MullingPinkerton, Debra Entered By: Alejandro MullingPinkerton, Debra on 01/05/2017 12:55:47 [name], [name] ([id number]) -------------------------------------------------------------------------------- Multi Wound Chart Details Patient Name: [name], [name] Date of Service: 01/05/2017 12:30 PM Medical Record Number: [id number] Patient Account Number: [id number] Date of Birth/Sex: July 27, 1960 (56 y.o. Female) Treating RN: Ashok CordiaPinkerton, Debi Primary Care Cleburne Savini: WHITE, Lanora ManisELIZABETH Other Clinician: Referring Dallys Nowakowski: WHITE, ELIZABETH Treating Opie Fanton/Extender: STONE III, HOYT Weeks in Treatment: 1 Vital Signs Height(in): 63 Pulse(bpm): 57 Weight(lbs): 187.3 Blood Pressure 136/50 (mmHg): Body Mass Index(BMI): 33 Temperature(F): 97.8 Respiratory Rate 20 (breaths/min): Photos: [1:No Photos] [N/A:N/A] Wound Location: [1:Right Ischium]  [N/A:N/A] Wounding Event: [1:Surgical Injury] [N/A:N/A] Primary Etiology: [1:Open Surgical Wound] [N/A:N/A] Comorbid History: [1:Chronic Obstructive Pulmonary Disease (COPD), Hypertension, Cirrhosis , Osteoarthritis] [N/A:N/A] Date Acquired: [1:12/15/2016] [N/A:N/A] Weeks of Treatment: [1:1] [N/A:N/A] Wound Status: [1:Open] [N/A:N/A] Measurements L x W x D 1.5x1.1x0.6 [N/A:N/A] (cm) Area (cm) : [1:1.296] [N/A:N/A] Volume (cm) : [1:0.778] [N/A:N/A] % Reduction in Area: [1:17.50%] [N/A:N/A] % Reduction in Volume: 17.40% [N/A:N/A] Starting Position 1 1 (o'clock): Ending Position 1 (o'clock): Maximum Distance 1 1.6 (cm): Undermining: [1:Yes] [N/A:N/A] Classification: [1:Partial Thickness] [N/A:N/A] Exudate Amount: [1:Large] [N/A:N/A] Exudate Type: [1:Serosanguineous] [N/A:N/A] Exudate Color: [1:red, brown] [N/A:N/A] Wound Margin: [1:Distinct, outline attached] [N/A:N/A] Granulation Amount: [1:Large (67-100%)] [N/A:N/A] Granulation Quality: Red N/A N/A Necrotic Amount: Small (1-33%) N/A N/A  Epithelialization: None N/A N/A Periwound Skin Texture: No Abnormalities Noted N/A N/A Periwound Skin No Abnormalities Noted N/A N/A Moisture: Periwound Skin Color: No Abnormalities Noted N/A N/A Temperature: No Abnormality N/A N/A Tenderness on Yes N/A N/A Palpation: Wound Preparation: Ulcer Cleansing: N/A N/A Rinsed/Irrigated with Saline Topical Anesthetic Applied: Other: lidocaine 4% Treatment Notes Electronic Signature(s) Signed: 01/05/2017 4:29:00 PM By: Alejandro Mulling Entered By: Alejandro Mulling on 01/05/2017 12:57:29 [name], [name] ([id number]) -------------------------------------------------------------------------------- Multi-Disciplinary Care Plan Details Patient Name: Danielle Seen B. Date of Service: 01/05/2017 12:30 PM Medical Record Number: [id number] Patient Account Number: [id number] Date of Birth/Sex: 07-26-1960 (56 y.o. Female) Treating RN: Ashok Cordia,  Debi Primary Care Madaline Lefeber: WHITE, Lanora Manis Other Clinician: Referring Dewell [name]: WHITE, Lanora Manis Treating Bridney Guadarrama/Extender: Linwood Dibbles, HOYT Weeks in Treatment: 1 Active Inactive ` Abuse / Safety / Falls / Self Care Management Nursing Diagnoses: Potential for falls Goals: Patient will remain injury free related to falls Date Initiated: 12/29/2016 Target Resolution Date: 04/18/2017 Goal Status: Active Interventions: Assess personal safety and home safety (as indicated) on admission and as needed Assess self care needs on admission and as needed Notes: ` Orientation to the Wound Care Program Nursing Diagnoses: Knowledge deficit related to the wound healing center program Goals: Patient/caregiver will verbalize understanding of the Wound Healing Center Program Date Initiated: 12/29/2016 Target Resolution Date: 01/17/2017 Goal Status: Active Interventions: Provide education on orientation to the wound center Notes: ` Pain, Acute or Chronic Nursing Diagnoses: Pain, acute or chronic: actual or potential [name], [name] ([id number]) Potential alteration in comfort, pain Goals: Patient/caregiver will verbalize adequate pain control between visits Date Initiated: 12/29/2016 Target Resolution Date: 03/21/2017 Goal Status: Active Interventions: Assess comfort goal upon admission Complete pain assessment as per visit requirements Notes: ` Wound/Skin Impairment Nursing Diagnoses: Impaired tissue integrity Knowledge deficit related to smoking impact on wound healing Knowledge deficit related to ulceration/compromised skin integrity Goals: Ulcer/skin breakdown will have a volume reduction of 80% by week 12 Date Initiated: 12/29/2016 Target Resolution Date: 04/11/2017 Goal Status: Active Interventions: Assess patient/caregiver ability to perform ulcer/skin care regimen upon admission and as needed Assess ulceration(s) every visit Notes: Electronic Signature(s) Signed:  01/05/2017 4:29:00 PM By: Alejandro Mulling Entered By: Alejandro Mulling on 01/05/2017 12:57:23 [name], [name] ([id number]) -------------------------------------------------------------------------------- Pain Assessment Details Patient Name: Danielle Seen B. Date of Service: 01/05/2017 12:30 PM Medical Record Number: [id number] Patient Account Number: [id number] Date of Birth/Sex: 08-30-1960 (56 y.o. Female) Treating RN: Ashok Cordia, Debi Primary Care Shawn Carattini: WHITE, Lanora Manis Other Clinician: Referring Xara Paulding: WHITE, Lanora Manis Treating Kathalina Ostermann/Extender: STONE III, HOYT Weeks in Treatment: 1 Active Problems Location of Pain Severity and Description of Pain Patient Has Paino No Site Locations With Dressing Change: No Pain Management and Medication Current Pain Management: Electronic Signature(s) Signed: 01/05/2017 4:29:00 PM By: Alejandro Mulling Entered By: Alejandro Mulling on 01/05/2017 12:45:42 [name], [name] ([id number]) -------------------------------------------------------------------------------- Patient/Caregiver Education Details Patient Name: Danielle Seen B. Date of Service: 01/05/2017 12:30 PM Medical Record Number: [id number] Patient Account Number: [id number] Date of Birth/Gender: [birth year] (56 y.o. Female) Treating RN: Ashok Cordia, Debi Primary Care Physician: WHITE, Lanora Manis Other Clinician: Referring Physician: WHITE, Lanora Manis Treating Physician/Extender: Skeet Simmer in Treatment: 1 Education Assessment Education Provided To: Patient Education Topics Provided Wound/Skin Impairment: Handouts: Other: change dressing as ordered Methods: Demonstration, Explain/Verbal Responses: State content correctly Electronic Signature(s) Signed: 01/05/2017 4:29:00 PM By: Alejandro Mulling Entered By: Alejandro Mulling on 01/05/2017 13:29:46 [name], [name] ([id number]) -------------------------------------------------------------------------------- Wound  Assessment Details Patient Name: [name], [name] Date of Service: 01/05/2017 12:30 PM Medical  Record Number: [id number] Patient Account Number: [id number] Date of Birth/Sex: Apr 13, 1961 (56 y.o. Female) Treating RN: Ashok Cordia, Debi Primary Care Jhonnie Aliano: WHITE, Lanora Manis Other Clinician: Referring Cher Franzoni: WHITE, ELIZABETH Treating Brently Voorhis/Extender: STONE III, HOYT Weeks in Treatment: 1 Wound Status Wound Number: 1 Primary Open Surgical Wound Etiology: Wound Location: Right Ischium Wound Open Wounding Event: Surgical Injury Status: Date Acquired: 12/15/2016 Comorbid Chronic Obstructive Pulmonary Weeks Of Treatment: 1 History: Disease (COPD), Hypertension, Clustered Wound: No Cirrhosis , Osteoarthritis Photos Photo Uploaded By: Alejandro Mulling on 01/05/2017 16:25:33 Wound Measurements Length: (cm) 1.5 Width: (cm) 1.1 Depth: (cm) 0.6 Area: (cm) 1.296 Volume: (cm) 0.778 % Reduction in Area: 17.5% % Reduction in Volume: 17.4% Epithelialization: None Tunneling: No Undermining: Yes Starting Position (o'clock): 1 Maximum Distance: (cm) 1.6 Wound Description Classification: Partial Thickness Wound Margin: Distinct, outline attached Exudate Amount: Large Exudate Type: Serosanguineous Exudate Color: red, brown Foul Odor After Cleansing: No Slough/Fibrino No Wound Bed Granulation Amount: Large (67-100%) [name], [name] B. ([id number]) Granulation Quality: Red Necrotic Amount: Small (1-33%) Necrotic Quality: Adherent Slough Periwound Skin Texture Texture Color No Abnormalities Noted: No No Abnormalities Noted: No Moisture Temperature / Pain No Abnormalities Noted: No Temperature: No Abnormality Tenderness on Palpation: Yes Wound Preparation Ulcer Cleansing: Rinsed/Irrigated with Saline Topical Anesthetic Applied: Other: lidocaine 4%, Treatment Notes Wound #1 (Right Ischium) 1. Cleansed with: Clean wound with Normal Saline 2. Anesthetic Topical Lidocaine 4%  cream to wound bed prior to debridement 3. Peri-wound Care: Skin Prep 7. Secured with Tape Notes SnapVac Electronic Signature(s) Signed: 01/05/2017 4:29:00 PM By: Alejandro Mulling Entered By: Alejandro Mulling on 01/05/2017 12:54:58 Tarbell, [name] ([id number]) -------------------------------------------------------------------------------- Vitals Details Patient Name: Danielle Seen B. Date of Service: 01/05/2017 12:30 PM Medical Record Number: [id number] Patient Account Number: [id number] Date of Birth/Sex: [birth year] (56 y.o. Female) Treating RN: Ashok Cordia, Debi Primary Care Wilberth Damon: WHITE, Lanora Manis Other Clinician: Referring Brynlea Spindler: WHITE, ELIZABETH Treating Gerlene Glassburn/Extender: STONE III, HOYT Weeks in Treatment: 1 Vital Signs Time Taken: 12:45 Temperature (F): 97.8 Height (in): 63 Pulse (bpm): 57 Weight (lbs): 187.3 Respiratory Rate (breaths/min): 20 Body Mass Index (BMI): 33.2 Blood Pressure (mmHg): 136/50 Reference Range: 80 - 120 mg / dl Electronic Signature(s) Signed: 01/05/2017 4:29:00 PM By: Alejandro Mulling Entered By: Alejandro Mulling on 01/05/2017 12:47:45

## 2017-01-12 ENCOUNTER — Encounter: Payer: Medicare Other | Attending: Surgery | Admitting: Surgery

## 2017-01-12 DIAGNOSIS — F1010 Alcohol abuse, uncomplicated: Secondary | ICD-10-CM | POA: Insufficient documentation

## 2017-01-12 DIAGNOSIS — F17218 Nicotine dependence, cigarettes, with other nicotine-induced disorders: Secondary | ICD-10-CM | POA: Insufficient documentation

## 2017-01-12 DIAGNOSIS — I10 Essential (primary) hypertension: Secondary | ICD-10-CM | POA: Diagnosis not present

## 2017-01-12 DIAGNOSIS — Y839 Surgical procedure, unspecified as the cause of abnormal reaction of the patient, or of later complication, without mention of misadventure at the time of the procedure: Secondary | ICD-10-CM | POA: Diagnosis not present

## 2017-01-12 DIAGNOSIS — J449 Chronic obstructive pulmonary disease, unspecified: Secondary | ICD-10-CM | POA: Diagnosis not present

## 2017-01-12 DIAGNOSIS — L97113 Non-pressure chronic ulcer of right thigh with necrosis of muscle: Secondary | ICD-10-CM | POA: Insufficient documentation

## 2017-01-12 DIAGNOSIS — T8131XA Disruption of external operation (surgical) wound, not elsewhere classified, initial encounter: Secondary | ICD-10-CM | POA: Diagnosis not present

## 2017-01-12 DIAGNOSIS — F17290 Nicotine dependence, other tobacco product, uncomplicated: Secondary | ICD-10-CM | POA: Insufficient documentation

## 2017-01-12 DIAGNOSIS — M1990 Unspecified osteoarthritis, unspecified site: Secondary | ICD-10-CM | POA: Insufficient documentation

## 2017-01-12 DIAGNOSIS — K7460 Unspecified cirrhosis of liver: Secondary | ICD-10-CM | POA: Diagnosis not present

## 2017-01-13 NOTE — Progress Notes (Signed)
[name], Verdean B. ([id number]) Visit Report for 01/12/2017 Arrival Information Details Patient Name: [name], [name] Date of Service: 01/12/2017 1:30 PM Medical Record Number: [id number] Patient Account Number: [id number] Date of Birth/Sex: 12-Oct-1960 (56 y.o. Female) Treating RN: Ashok CordiaPinkerton, Debi Primary Care Fabiana Dromgoole: WHITE, Lanora ManisELIZABETH Other Clinician: Referring Laytoya Ion: WHITE, Lanora ManisELIZABETH Treating Honor Frison/Extender: Rudene ReBritto, Errol Weeks in Treatment: 2 Visit Information History Since Last Visit All ordered tests and consults were completed: No Patient Arrived: Ambulatory Added or deleted any medications: No Arrival Time: 13:32 Any new allergies or adverse reactions: No Accompanied By: HUSBAND Had a fall or experienced change in No Transfer Assistance: None activities of daily living that may affect Patient Identification Verified: Yes risk of falls: Secondary Verification Process Yes Signs or symptoms of abuse/neglect since last No Completed: visito Patient Requires Transmission-Based No Hospitalized since last visit: No Precautions: Has Dressing in Place as Prescribed: Yes Patient Has Alerts: No Pain Present Now: No Electronic Signature(s) Signed: 01/12/2017 4:51:42 PM By: Alejandro MullingPinkerton, Debra Entered By: Alejandro MullingPinkerton, Debra on 01/12/2017 13:33:29 Silvers, Nicki GuadalajaraJANET B. ([id number]) -------------------------------------------------------------------------------- Encounter Discharge Information Details Patient Name: Danielle SeenWORKMAN, Koby B. Date of Service: 01/12/2017 1:30 PM Medical Record Number: [id number] Patient Account Number: [id number] Date of Birth/Sex: 12-Oct-1960 (56 y.o. Female) Treating RN: Ashok CordiaPinkerton, Debi Primary Care Justinian Miano: WHITE, Lanora ManisELIZABETH Other Clinician: Referring Melodi Happel: WHITE, Lanora ManisELIZABETH Treating Kit Mollett/Extender: Rudene ReBritto, Errol Weeks in Treatment: 2 Encounter Discharge Information Items Discharge Pain Level: 0 Discharge Condition: Stable Ambulatory Status:  Ambulatory Discharge Destination: Home Transportation: Private Auto Accompanied By: husband Schedule Follow-up Appointment: Yes Medication Reconciliation completed No and provided to Patient/Care Brigitta Pricer: Patient Clinical Summary of Care: Declined Electronic Signature(s) Signed: 01/12/2017 4:51:42 PM By: Alejandro MullingPinkerton, Debra Previous Signature: 01/12/2017 2:05:04 PM Version By: Gwenlyn PerkingMoore, Shelia Entered By: Alejandro MullingPinkerton, Debra on 01/12/2017 14:09:16 Tuel, Nicki GuadalajaraJANET B. ([id number]) -------------------------------------------------------------------------------- Lower Extremity Assessment Details Patient Name: Delcastillo, [name] Date of Service: 01/12/2017 1:30 PM Medical Record Number: [id number] Patient Account Number: [id number] Date of Birth/Sex: 12-Oct-1960 (56 y.o. Female) Treating RN: Ashok CordiaPinkerton, Debi Primary Care Gerhardt Gleed: Doristine MangoWHITE, ELIZABETH Other Clinician: Referring Anders Hohmann: WHITE, Lanora ManisELIZABETH Treating Kelvis Berger/Extender: Rudene ReBritto, Errol Weeks in Treatment: 2 Electronic Signature(s) Signed: 01/12/2017 4:51:42 PM By: Alejandro MullingPinkerton, Debra Entered By: Alejandro MullingPinkerton, Debra on 01/12/2017 13:41:55 Lothamer, Nicki GuadalajaraJANET B. ([id number]) -------------------------------------------------------------------------------- Multi Wound Chart Details Patient Name: Guest, [name] Date of Service: 01/12/2017 1:30 PM Medical Record Number: [id number] Patient Account Number: [id number] Date of Birth/Sex: 12-Oct-1960 (56 y.o. Female) Treating RN: Ashok CordiaPinkerton, Debi Primary Care Jabez Molner: WHITE, Lanora ManisELIZABETH Other Clinician: Referring Tommy Goostree: WHITE, Lanora ManisELIZABETH Treating Nioka Thorington/Extender: Rudene ReBritto, Errol Weeks in Treatment: 2 Vital Signs Height(in): 63 Pulse(bpm): 77 Weight(lbs): 187.3 Blood Pressure 114/61 (mmHg): Body Mass Index(BMI): 33 Temperature(F): 98.0 Respiratory Rate 20 (breaths/min): Photos: [1:No Photos] [N/A:N/A] Wound Location: [1:Right Ischium] [N/A:N/A] Wounding Event: [1:Surgical Injury] [N/A:N/A] Primary  Etiology: [1:Open Surgical Wound] [N/A:N/A] Comorbid History: [1:Chronic Obstructive Pulmonary Disease (COPD), Hypertension, Cirrhosis , Osteoarthritis] [N/A:N/A] Date Acquired: [1:12/15/2016] [N/A:N/A] Weeks of Treatment: [1:2] [N/A:N/A] Wound Status: [1:Open] [N/A:N/A] Measurements L x W x D 1x0.4x0.7 [N/A:N/A] (cm) Area (cm) : [1:0.314] [N/A:N/A] Volume (cm) : [1:0.22] [N/A:N/A] % Reduction in Area: [1:80.00%] [N/A:N/A] % Reduction in Volume: 76.60% [N/A:N/A] Starting Position 1 1 (o'clock): Ending Position 1 (o'clock): Maximum Distance 1 1.3 (cm): Undermining: [1:Yes] [N/A:N/A] Classification: [1:Partial Thickness] [N/A:N/A] Exudate Amount: [1:Large] [N/A:N/A] Exudate Type: [1:Serosanguineous] [N/A:N/A] Exudate Color: [1:red, brown] [N/A:N/A] Wound Margin: [1:Distinct, outline attached] [N/A:N/A] Granulation Amount: [1:Large (67-100%)] [N/A:N/A] Granulation Quality: Red N/A N/A Necrotic Amount: Small (1-33%) N/A N/A Epithelialization: None N/A N/A Periwound Skin Texture: No Abnormalities Noted N/A  N/A Periwound Skin No Abnormalities Noted N/A N/A Moisture: Periwound Skin Color: No Abnormalities Noted N/A N/A Temperature: No Abnormality N/A N/A Tenderness on Yes N/A N/A Palpation: Wound Preparation: Ulcer Cleansing: N/A N/A Rinsed/Irrigated with Saline Topical Anesthetic Applied: Other: lidocaine 4% Treatment Notes Electronic Signature(s) Signed: 01/12/2017 1:47:49 PM By: Evlyn Kanner MD, FACS Entered By: Evlyn Kanner on 01/12/2017 13:47:48 Rape, Nicki [name] ([id number]) -------------------------------------------------------------------------------- Multi-Disciplinary Care Plan Details Patient Name: Danielle Seen B. Date of Service: 01/12/2017 1:30 PM Medical Record Number: [id number] Patient Account Number: [id number] Date of Birth/Sex: [birth year] (56 y.o. Female) Treating RN: Ashok Cordia, Debi Primary Care Quindarrius Joplin: WHITE, Lanora Manis Other Clinician: Referring  Umberto Pavek: WHITE, Lanora Manis Treating Amarise Lillo/Extender: Rudene Re in Treatment: 2 Active Inactive ` Abuse / Safety / Falls / Self Care Management Nursing Diagnoses: Potential for falls Goals: Patient will remain injury free related to falls Date Initiated: 12/29/2016 Target Resolution Date: 04/18/2017 Goal Status: Active Interventions: Assess personal safety and home safety (as indicated) on admission and as needed Assess self care needs on admission and as needed Notes: ` Orientation to the Wound Care Program Nursing Diagnoses: Knowledge deficit related to the wound healing center program Goals: Patient/caregiver will verbalize understanding of the Wound Healing Center Program Date Initiated: 12/29/2016 Target Resolution Date: 01/17/2017 Goal Status: Active Interventions: Provide education on orientation to the wound center Notes: ` Pain, Acute or Chronic Nursing Diagnoses: Pain, acute or chronic: actual or potential Curl, Freddye B. ([id number]) Potential alteration in comfort, pain Goals: Patient/caregiver will verbalize adequate pain control between visits Date Initiated: 12/29/2016 Target Resolution Date: 03/21/2017 Goal Status: Active Interventions: Assess comfort goal upon admission Complete pain assessment as per visit requirements Notes: ` Wound/Skin Impairment Nursing Diagnoses: Impaired tissue integrity Knowledge deficit related to smoking impact on wound healing Knowledge deficit related to ulceration/compromised skin integrity Goals: Ulcer/skin breakdown will have a volume reduction of 80% by week 12 Date Initiated: 12/29/2016 Target Resolution Date: 04/11/2017 Goal Status: Active Interventions: Assess patient/caregiver ability to perform ulcer/skin care regimen upon admission and as needed Assess ulceration(s) every visit Notes: Electronic Signature(s) Signed: 01/12/2017 4:51:42 PM By: Alejandro Mulling Entered By: Alejandro Mulling on 01/12/2017  13:44:47 Heskett, Brigida BMarland Kitchen ([id number]) -------------------------------------------------------------------------------- Pain Assessment Details Patient Name: Danielle Seen B. Date of Service: 01/12/2017 1:30 PM Medical Record Number: [id number] Patient Account Number: [id number] Date of Birth/Sex: 03/15/1961 (56 y.o. Female) Treating RN: Ashok Cordia, Debi Primary Care Carlester Kasparek: WHITE, Lanora Manis Other Clinician: Referring Aneesh Faller: WHITE, Lanora Manis Treating Aarik Blank/Extender: Rudene Re in Treatment: 2 Active Problems Location of Pain Severity and Description of Pain Patient Has Paino No Site Locations With Dressing Change: No Pain Management and Medication Current Pain Management: Electronic Signature(s) Signed: 01/12/2017 4:51:42 PM By: Alejandro Mulling Entered By: Alejandro Mulling on 01/12/2017 13:33:35 Crego, Nicki [name] ([id number]) -------------------------------------------------------------------------------- Patient/Caregiver Education Details Patient Name: Danielle Seen B. Date of Service: 01/12/2017 1:30 PM Medical Record Number: [id number] Patient Account Number: [id number] Date of Birth/Gender: Apr 22, 1961 (56 y.o. Female) Treating RN: Ashok Cordia, Debi Primary Care Physician: WHITE, Lanora Manis Other Clinician: Referring Physician: WHITE, Lanora Manis Treating Physician/Extender: Rudene Re in Treatment: 2 Education Assessment Education Provided To: Patient Education Topics Provided Wound/Skin Impairment: Handouts: Other: change dressing as ordered Methods: Demonstration, Explain/Verbal Responses: State content correctly Electronic Signature(s) Signed: 01/12/2017 4:51:42 PM By: Alejandro Mulling Entered By: Alejandro Mulling on 01/12/2017 14:09:34 Pewitt, Nicki [name] ([id number]) -------------------------------------------------------------------------------- Wound Assessment Details Patient Name: Weyman, Alexsys B. Date of Service: 01/12/2017 1:30 PM Medical  Record Number: [id number] Patient Account Number: [id number] Date of Birth/Sex: 11-11-1960 (56  y.o. Female) Treating RN: Ashok Cordia, Debi Primary Care Delesha Pohlman: WHITE, Lanora Manis Other Clinician: Referring Jisella Ashenfelter: WHITE, Lanora Manis Treating Kealii Thueson/Extender: Rudene Re in Treatment: 2 Wound Status Wound Number: 1 Primary Open Surgical Wound Etiology: Wound Location: Right Ischium Wound Open Wounding Event: Surgical Injury Status: Date Acquired: 12/15/2016 Comorbid Chronic Obstructive Pulmonary Weeks Of Treatment: 2 History: Disease (COPD), Hypertension, Clustered Wound: No Cirrhosis , Osteoarthritis Photos Photo Uploaded By: Alejandro Mulling on 01/12/2017 14:17:39 Wound Measurements Length: (cm) 1 Width: (cm) 0.4 Depth: (cm) 0.7 Area: (cm) 0.314 Volume: (cm) 0.22 % Reduction in Area: 80% % Reduction in Volume: 76.6% Epithelialization: None Tunneling: No Undermining: Yes Starting Position (o'clock): 1 Maximum Distance: (cm) 1.3 Wound Description Classification: Partial Thickness Wound Margin: Distinct, outline attached Exudate Amount: Large Exudate Type: Serosanguineous Exudate Color: red, brown Foul Odor After Cleansing: No Slough/Fibrino No Wound Bed Granulation Amount: Large (67-100%) Hulse, Alira B. ([id number]) Granulation Quality: Red Necrotic Amount: Small (1-33%) Necrotic Quality: Adherent Slough Periwound Skin Texture Texture Color No Abnormalities Noted: No No Abnormalities Noted: No Moisture Temperature / Pain No Abnormalities Noted: No Temperature: No Abnormality Tenderness on Palpation: Yes Wound Preparation Ulcer Cleansing: Rinsed/Irrigated with Saline Topical Anesthetic Applied: Other: lidocaine 4%, Treatment Notes Wound #1 (Right Ischium) 1. Cleansed with: Clean wound with Normal Saline 2. Anesthetic Topical Lidocaine 4% cream to wound bed prior to debridement 3. Peri-wound Care: Skin Prep 7. Secured with Tape 8.  Negative Pressure Wound Therapy Number of foam/gauze pieces used in the dressing = Notes SnapVac one piece of blue foam Electronic Signature(s) Signed: 01/12/2017 4:51:42 PM By: Alejandro Mulling Entered By: Alejandro Mulling on 01/12/2017 13:40:49 [place], Nicki [name] ([id number]) -------------------------------------------------------------------------------- Vitals Details Patient Name: Danielle Seen B. Date of Service: 01/12/2017 1:30 PM Medical Record Number: [id number] Patient Account Number: [id number] Date of Birth/Sex: 10/07/1960 (56 y.o. Female) Treating RN: Ashok Cordia, Debi Primary Care Catalaya Garr: WHITE, Lanora Manis Other Clinician: Referring Asheton Scheffler: WHITE, Lanora Manis Treating Elmor Kost/Extender: Rudene Re in Treatment: 2 Vital Signs Time Taken: 13:33 Temperature (F): 98.0 Height (in): 63 Pulse (bpm): 77 Weight (lbs): 187.3 Respiratory Rate (breaths/min): 20 Body Mass Index (BMI): 33.2 Blood Pressure (mmHg): 114/61 Reference Range: 80 - 120 mg / dl Electronic Signature(s) Signed: 01/12/2017 4:51:42 PM By: Alejandro Mulling Entered By: Alejandro Mulling on 01/12/2017 13:35:31

## 2017-01-13 NOTE — Progress Notes (Signed)
KYANA, AICHER ([id number]) Visit Report for 01/12/2017 Chief Complaint Document Details Patient Name: [name], [name] Date of Service: 01/12/2017 1:30 PM Medical Record Number: [id number] Patient Account Number: [id number] Date of Birth/Sex: [birth year] (56 y.o. Female) Treating RN: Ashok Cordia, Debi Primary Care Provider: WHITE, Lanora Manis Other Clinician: Referring Provider: WHITE, Lanora Manis Treating Provider/Extender: Rudene Re in Treatment: 2 Information Obtained from: Patient Chief Complaint Patient presents to the wound care center with open non-healing surgical wound to the right upper thigh Electronic Signature(s) Signed: 01/12/2017 1:47:56 PM By: Evlyn Kanner MD, FACS Entered By: Evlyn Kanner on 01/12/2017 13:47:55 Alldredge, Nicki [name] ([id number]) -------------------------------------------------------------------------------- HPI Details Patient Name: [name] B. Date of Service: 01/12/2017 1:30 PM Medical Record Number: [id number] Patient Account Number: [id number] Date of Birth/Sex: 1961/05/18 (56 y.o. Female) Treating RN: Ashok Cordia, Debi Primary Care Provider: WHITE, Lanora Manis Other Clinician: Referring Provider: WHITE, Lanora Manis Treating Provider/Extender: Rudene Re in Treatment: 2 History of Present Illness Location: right upper thigh Quality: Patient reports experiencing a dull pain to affected area(s). Severity: Patient states wound are getting better Duration: Patient has had the wound for < 6 weeks prior to presenting for treatment Timing: Pain in wound is Intermittent (comes and goes Context: The wound occurred when the patient had incision and drainage of a hematoma of the right thigh Modifying Factors: Other treatment(s) tried include:washing with dilute hydrogen peroxide Associated Signs and Symptoms: Patient reports having increase discharge. HPI Description: 56 year old female who was admitted to the hospital on 524 and kept overnight  for a right hip hematoma which an incision and drainage was done in the OR. inpatient she was treated with IV antibiotics( vancomycin and Ancef) and discharged home on Bactrim DS. the patient has several comorbidities including a former smoker who quit in January of this year and was also a heavy drinker in the past. past medical history significant for COPD, cirrhosis of the liver, arthritis, tobacco abuse and alcohol abuse. 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 The patient has given up cigarettes in January of this year but smokes E cigarette since then. She has given up a heavy alcohol intake for the last 7 years 01/05/17 patient's right lateral hip wound appears to be doing fairly well on evaluation today. She shows no signs or evidence of significant infection and she has been tolerating the dressing changes without complication. She continues to have some depth and undermining in regard to her wound. Today we are going to initiate the SNAP Vac for her. Electronic Signature(s) Signed: 01/12/2017 1:48:05 PM By: Evlyn Kanner MD, FACS Entered By: Evlyn Kanner on 01/12/2017 13:48:05 Tennell, Nicki [name] ([id number]) -------------------------------------------------------------------------------- Physical Exam Details Patient Name: [name], [name] Date of Service: 01/12/2017 1:30 PM Medical Record Number: [id number] Patient Account Number: [id number] Date of Birth/Sex: 07-22-1960 (56 y.o. Female) Treating RN: Ashok Cordia, Debi Primary Care Provider: WHITE, Lanora Manis Other Clinician: Referring Provider: WHITE, Lanora Manis Treating Provider/Extender: Rudene Re in Treatment: 2 Constitutional . Pulse regular. Respirations normal and unlabored. Afebrile. . Eyes Nonicteric. Reactive to light. Ears, Nose, Mouth, and Throat Lips, teeth, and gums WNL.Marland Kitchen Moist mucosa without lesions. Neck supple and nontender. No palpable supraclavicular or  cervical adenopathy. Normal sized without goiter. Respiratory WNL. No retractions.. Breath sounds WNL, No rubs, rales, rhonchi, or wheeze.. Cardiovascular Heart rhythm and rate regular, no murmur or gallop.. Pedal Pulses WNL. No clubbing, cyanosis or edema. Chest Breasts symmetical and no nipple discharge.. Breast tissue WNL, no masses, lumps, or tenderness.. Lymphatic No  adneopathy. No adenopathy. No adenopathy. Musculoskeletal Adexa without tenderness or enlargement.. Digits and nails w/o clubbing, cyanosis, infection, petechiae, ischemia, or inflammatory conditions.. Integumentary (Hair, Skin) No suspicious lesions. No crepitus or fluctuance. No peri-wound warmth or erythema. No masses.Marland Kitchen. Psychiatric Judgement and insight Intact.. No evidence of depression, anxiety, or agitation.. Notes no sharp debridement was required today and we will continue with the snap vacuum Electronic Signature(s) Signed: 01/12/2017 1:48:27 PM By: Evlyn KannerBritto, Yesenia Locurto MD, FACS Entered By: Evlyn KannerBritto, Sherrice Creekmore on 01/12/2017 13:48:27 Perdue, Nicki GuadalajaraJANET B. ([id number]) -------------------------------------------------------------------------------- Physician Orders Details Patient Name: Danielle SeenWORKMAN, [name] Date of Service: 01/12/2017 1:30 PM Medical Record Number: [id number] Patient Account Number: [id number] Date of Birth/Sex: Dec 26, 1960 (56 y.o. Female) Treating RN: Ashok CordiaPinkerton, Debi Primary Care Provider: WHITE, Lanora ManisELIZABETH Other Clinician: Referring Provider: WHITE, Lanora ManisELIZABETH Treating Provider/Extender: Rudene ReBritto, Shetara Launer Weeks in Treatment: 2 Verbal / Phone Orders: Yes Clinician: Pinkerton, Debi Read Back and Verified: Yes Diagnosis Coding Wound Cleansing Wound #1 Right Ischium o Clean wound with Normal Saline. o Clean wound with wound cleanser. Anesthetic Wound #1 Right Ischium o Topical Lidocaine 4% cream applied to wound bed prior to debridement Skin Barriers/Peri-Wound Care Wound #1 Right Ischium o Skin  Prep Dressing Change Frequency Wound #1 Right Ischium o Change dressing every week Follow-up Appointments Wound #1 Right Ischium o Return Appointment in 1 week. Edema Control Wound #1 Right Ischium o Elevate legs to the level of the heart and pump ankles as often as possible Additional Orders / Instructions Wound #1 Right Ischium o Stop Smoking o Increase protein intake. Negative Pressure Wound Therapy Wound #1 Right Ischium o Other: - Snap Vac Gilson, Randalyn B. ([id number]) Medications-please add to medication list. Wound #1 Right Ischium o Other: - Vitamin C, Zinc, MVI Electronic Signature(s) Signed: 01/12/2017 4:25:24 PM By: Evlyn KannerBritto, Majd Tissue MD, FACS Signed: 01/12/2017 4:51:42 PM By: Alejandro MullingPinkerton, Debra Entered By: Alejandro MullingPinkerton, Debra on 01/12/2017 13:46:26 Leanos, Nicki GuadalajaraJANET B. ([id number]) -------------------------------------------------------------------------------- Problem List Details Patient Name: [name], [name] Date of Service: 01/12/2017 1:30 PM Medical Record Number: [id number] Patient Account Number: [id number] Date of Birth/Sex: Dec 26, 1960 (56 y.o. 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  ([id number]) -------------------------------------------------------------------------------- Progress Note Details Patient Name: [name], [name] Date of Service: 01/12/2017 1:30 PM Medical Record Number: [id number] Patient Account Number: [id number] Date of Birth/Sex: Dec 26, 1960 (56 y.o. Female) Treating RN: Ashok CordiaPinkerton, Debi Primary Care Provider: WHITE, Lanora ManisELIZABETH Other Clinician: Referring Provider: WHITE, Lanora ManisELIZABETH Treating Provider/Extender: Rudene ReBritto, Timisha Mondry Weeks in Treatment: 2 Subjective Chief Complaint Information obtained from Patient Patient presents to the wound care center with open non-healing surgical wound to the right upper thigh History of Present Illness (HPI) The following HPI elements were documented for the patient's wound: Location: right upper thigh Quality: Patient reports experiencing a dull pain to affected area(s). Severity: Patient states wound are getting better Duration: Patient has had the wound for < 6 weeks prior to presenting for treatment Timing: Pain in wound is Intermittent (comes and goes Context: The wound occurred when the patient had incision and  drainage of a hematoma of the right thigh Modifying Factors: Other treatment(s) tried include:washing with dilute hydrogen peroxide Associated Signs and Symptoms: Patient reports having increase discharge. 56 year old female who was admitted to the hospital on 524 and kept overnight for a right hip hematoma which an incision and drainage was done in the OR. inpatient she was treated with IV antibiotics ( vancomycin and Ancef) and discharged home on Bactrim DS. the patient has several comorbidities including a former smoker who quit in January of this year and was also a heavy drinker in the past. past medical history significant for COPD, cirrhosis of the liver, arthritis, tobacco abuse and alcohol abuse. 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 The patient has  given up cigarettes in January of this year but smokes E cigarette since then. She has given up a heavy alcohol intake for the last 7 years 01/05/17 patient's right lateral hip wound appears to be doing fairly well on evaluation today. She shows no signs or evidence of significant infection and she has been tolerating the dressing changes without complication. She continues to have some depth and undermining in regard to her wound. Today we are going to initiate the SNAP Vac for her. Mentink, Charlyn B. ([id number]) Objective Constitutional Pulse regular. Respirations normal and unlabored. Afebrile. Vitals Time Taken: 1:33 PM, Height: 63 in, Weight: 187.3 lbs, BMI: 33.2, Temperature: 98.0 F, Pulse: 77 bpm, Respiratory Rate: 20 breaths/min, Blood Pressure: 114/61 mmHg. Eyes Nonicteric. Reactive to light. Ears, Nose, Mouth, and Throat Lips, teeth, and gums WNL.Marland Kitchen Moist mucosa without lesions. Neck supple and nontender. No palpable supraclavicular or cervical adenopathy. Normal sized without goiter. Respiratory WNL. No retractions.. Breath sounds WNL, No rubs, rales, rhonchi, or wheeze.. Cardiovascular Heart rhythm and rate regular, no murmur or gallop.. Pedal Pulses WNL. No clubbing, cyanosis or edema. Chest Breasts symmetical and no nipple discharge.. Breast tissue WNL, no masses, lumps, or tenderness.. Lymphatic No adneopathy. No adenopathy. No adenopathy. Musculoskeletal Adexa without tenderness or enlargement.. Digits and nails w/o clubbing, cyanosis, infection, petechiae, ischemia, or inflammatory conditions.Marland Kitchen Psychiatric Judgement and insight Intact.. No evidence of depression, anxiety, or agitation.. General Notes: no sharp debridement was required today and we will continue with the snap vacuum Integumentary (Hair, Skin) No suspicious lesions. No crepitus or fluctuance. No peri-wound warmth or erythema. No masses.. Wound #1 status is Open. Original cause of wound was Surgical  Injury. The wound is located on the Right Ischium. The wound measures 1cm length x 0.4cm width x 0.7cm depth; 0.314cm^2 area and 0.22cm^3 volume. There is no tunneling noted, however, there is undermining starting at 1:00 and ending at :00 with a maximum distance of 1.3cm. There is a large amount of serosanguineous drainage noted. The wound margin is distinct with the outline attached to the wound base. There is large (67-100%) red granulation within the wound bed. There is a small (1-33%) amount of necrotic tissue within the wound bed including Cardozo, Lotta B. ([id number]) Adherent Slough. Periwound temperature was noted as No Abnormality. The periwound has tenderness on palpation. 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 Clean wound with wound cleanser. Anesthetic: Wound #1 Right Ischium: Topical Lidocaine 4% cream applied to wound bed prior to debridement Skin  Barriers/Peri-Wound Care: Wound #1 Right Ischium: Skin Prep Dressing Change Frequency: Wound #1 Right Ischium: Change dressing every week Follow-up Appointments: Wound #1 Right Ischium: Return Appointment in 1 week. Edema Control: Wound #1 Right Ischium: Elevate legs to the level of the heart and pump ankles as often as possible Additional Orders / Instructions: Wound #1 Right Ischium: Stop Smoking Increase protein intake. Negative Pressure Wound Therapy: Wound #1 Right Ischium: Other: - Snap Vac Medications-please add to medication list.: Wound #1 Right Ischium: YENESIS, EVEN B. ([id number]) Other: - Vitamin C, Zinc, MVI she was able to tolerate the snap vacuum for quite a few days  still 8 started malfunctioning and she packed the wound with silver alginate. After review I have recommended: 1. Continue to use a snap vacuum system 2. Adequate protein, vitamin A, vitamin C and zinc 3. the need to completely give up smoking the cigarettes. 4. Regular visits to the wound center Electronic Signature(s) Signed: 01/12/2017 1:50:11 PM By: Evlyn Kanner MD, FACS Entered By: Evlyn Kanner on 01/12/2017 13:50:11 Krukowski, Nicki [name] ([id number]) -------------------------------------------------------------------------------- SuperBill Details Patient Name: [name] B. Date of Service: 01/12/2017 Medical Record Number: [id number] Patient Account Number: [id number] Date of Birth/Sex: 22-Jul-1960 (56 y.o. 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 Disruption of external operation (surgical) wound, not elsewhere classified, initial T81.31XA encounter 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 dependence, cigarettes, with other nicotine-induced disorders Facility Procedures CPT4 Code: 08657846 Description: 97607 NEG PRESS WND TX <=50 SQ CM Modifier: Quantity: 1 Physician Procedures CPT4: Description Modifier Quantity Code [id number] - WC PHYS LEVEL 3 - EST PT 1 ICD-10 Description Diagnosis 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

## 2017-01-19 ENCOUNTER — Encounter: Payer: Medicare Other | Admitting: Physician Assistant

## 2017-01-19 DIAGNOSIS — L97113 Non-pressure chronic ulcer of right thigh with necrosis of muscle: Secondary | ICD-10-CM | POA: Diagnosis not present

## 2017-01-20 NOTE — Progress Notes (Signed)
Danielle RochesterWORKMAN, Valma B. ([id number]) Visit Report for 01/19/2017 Arrival Information Details Patient Name: Danielle RochesterWORKMAN, [name] Date of Service: 01/19/2017 8:00 AM Medical Record Number: [id number] Patient Account Number: [id number] Date of Birth/Sex: [birth year] (56 y.o. Female) Treating RN: Ashok CordiaPinkerton, Debi Primary Care Kingjames Coury: WHITE, Lanora ManisELIZABETH Other Clinician: Referring Devera Englander: WHITE, Lanora ManisELIZABETH Treating Lamyah Creed/Extender: Linwood DibblesSTONE III, HOYT Weeks in Treatment: 3 Visit Information History Since Last Visit All ordered tests and consults were completed: No Patient Arrived: Ambulatory Added or deleted any medications: No Arrival Time: 07:59 Any new allergies or adverse reactions: No Accompanied By: husband Had a fall or experienced change in No Transfer Assistance: None activities of daily living that may affect Patient Identification Verified: Yes risk of falls: Secondary Verification Process Yes Signs or symptoms of abuse/neglect since last No Completed: visito Patient Requires Transmission-Based No Hospitalized since last visit: No Precautions: Has Dressing in Place as Prescribed: Yes Patient Has Alerts: No Pain Present Now: No Electronic Signature(s) Signed: 01/19/2017 5:10:25 PM By: Alejandro MullingPinkerton, Debra Entered By: Alejandro MullingPinkerton, Debra on 01/19/2017 08:02:57 Beedy, [name] ([id number]) -------------------------------------------------------------------------------- Encounter Discharge Information Details Patient Name: Danielle SeenWORKMAN, [name] Date of Service: 01/19/2017 8:00 AM Medical Record Number: [id number] Patient Account Number: [id number] Date of Birth/Sex: [birth year] (56 y.o. Female) Treating RN: Ashok CordiaPinkerton, Debi Primary Care Allis Quirarte: WHITE, Lanora ManisELIZABETH Other Clinician: Referring Chastin Riesgo: WHITE, Lanora ManisELIZABETH Treating Kipp Shank/Extender: Linwood DibblesSTONE III, HOYT Weeks in Treatment: 3 Encounter Discharge Information Items Discharge Pain Level: 0 Discharge Condition: Stable Ambulatory Status:  Ambulatory Discharge Destination: Home Transportation: Private Auto Accompanied By: husband Schedule Follow-up Appointment: Yes Medication Reconciliation completed and provided to Patient/Care No Alder Murri: Provided on Clinical Summary of Care: 01/19/2017 Form Type Recipient Paper Patient JW Electronic Signature(s) Signed: 01/19/2017 8:35:43 AM By: Gwenlyn PerkingMoore, Shelia Entered By: Gwenlyn PerkingMoore, Shelia on 01/19/2017 08:35:43 [name], [name] ([id number]) -------------------------------------------------------------------------------- Lower Extremity Assessment Details Patient Name: Tomei, Philicia B. Date of Service: 01/19/2017 8:00 AM Medical Record Number: [id number] Patient Account Number: [id number] Date of Birth/Sex: [birth year] (56 y.o. Female) Treating RN: Phillis HaggisPinkerton, Debi Primary Care Arneta Mahmood: Doristine MangoWHITE, ELIZABETH Other Clinician: Referring Kaci Freel: WHITE, Lanora ManisELIZABETH Treating Debra Calabretta/Extender: Linwood DibblesSTONE III, HOYT Weeks in Treatment: 3 Electronic Signature(s) Signed: 01/19/2017 5:10:25 PM By: Alejandro MullingPinkerton, Debra Entered By: Alejandro MullingPinkerton, Debra on 01/19/2017 08:09:29 [name], [name] ([id number]) -------------------------------------------------------------------------------- Multi Wound Chart Details Patient Name: [name], [name] Date of Service: 01/19/2017 8:00 AM Medical Record Number: [id number] Patient Account Number: [id number] Date of Birth/Sex: [birth year] (56 y.o. Female) Treating RN: Ashok CordiaPinkerton, Debi Primary Care Salik Grewell: WHITE, Lanora ManisELIZABETH Other Clinician: Referring Brettany Sydney: WHITE, ELIZABETH Treating Nykerria Macconnell/Extender: STONE III, HOYT Weeks in Treatment: 3 Vital Signs Height(in): 63 Pulse(bpm): 69 Weight(lbs): 187.3 Blood Pressure 131/55 (mmHg): Body Mass Index(BMI): 33 Temperature(F): 97.7 Respiratory Rate 20 (breaths/min): Photos: [1:No Photos] [N/A:N/A] Wound Location: [1:Right Ischium] [N/A:N/A] Wounding Event: [1:Surgical Injury] [N/A:N/A] Primary Etiology: [1:Open Surgical  Wound] [N/A:N/A] Comorbid History: [1:Chronic Obstructive Pulmonary Disease (COPD), Hypertension, Cirrhosis , Osteoarthritis] [N/A:N/A] Date Acquired: [1:12/15/2016] [N/A:N/A] Weeks of Treatment: [1:3] [N/A:N/A] Wound Status: [1:Open] [N/A:N/A] Measurements L x W x D 0.4x0.5x0.5 [N/A:N/A] (cm) Area (cm) : [1:0.157] [N/A:N/A] Volume (cm) : [1:0.079] [N/A:N/A] % Reduction in Area: [1:90.00%] [N/A:N/A] % Reduction in Volume: 91.60% [N/A:N/A] Starting Position 1 12 (o'clock): Ending Position 1 (o'clock): Maximum Distance 1 0.9 (cm): Undermining: [1:Yes] [N/A:N/A] Classification: [1:Partial Thickness] [N/A:N/A] Exudate Amount: [1:Large] [N/A:N/A] Exudate Type: [1:Serosanguineous] [N/A:N/A] Exudate Color: [1:red, brown] [N/A:N/A] Wound Margin: [1:Distinct, outline attached] [N/A:N/A] Granulation Amount: [1:Large (67-100%)] [N/A:N/A] Granulation Quality: Red N/A N/A Necrotic Amount: Small (1-33%) N/A N/A Epithelialization: None N/A N/A Periwound Skin Texture: No Abnormalities  Noted N/A N/A Periwound Skin No Abnormalities Noted N/A N/A Moisture: Periwound Skin Color: No Abnormalities Noted N/A N/A Temperature: No Abnormality N/A N/A Tenderness on Yes N/A N/A Palpation: Wound Preparation: Ulcer Cleansing: N/A N/A Rinsed/Irrigated with Saline Topical Anesthetic Applied: Other: lidocaine 4% Treatment Notes Electronic Signature(s) Signed: 01/19/2017 5:10:25 PM By: Alejandro Mulling Entered By: Alejandro Mulling on 01/19/2017 08:09:42 [name] ([id number]) -------------------------------------------------------------------------------- Multi-Disciplinary Care Plan Details Patient Name: [name] B. Date of Service: 01/19/2017 8:00 AM Medical Record Number: [id number] Patient Account Number: [id number] Date of Birth/Sex: May 18, 1961 (56 y.o. Female) Treating RN: Ashok Cordia, Debi Primary Care Donatello Kleve: WHITE, Lanora Manis Other Clinician: Referring Nguyen Butler: WHITE,  Lanora Manis Treating Kaleiyah Polsky/Extender: Linwood Dibbles, HOYT Weeks in Treatment: 3 Active Inactive ` Abuse / Safety / Falls / Self Care Management Nursing Diagnoses: Potential for falls Goals: Patient will remain injury free related to falls Date Initiated: 12/29/2016 Target Resolution Date: 04/18/2017 Goal Status: Active Interventions: Assess personal safety and home safety (as indicated) on admission and as needed Assess self care needs on admission and as needed Notes: ` Orientation to the Wound Care Program Nursing Diagnoses: Knowledge deficit related to the wound healing center program Goals: Patient/caregiver will verbalize understanding of the Wound Healing Center Program Date Initiated: 12/29/2016 Target Resolution Date: 01/17/2017 Goal Status: Active Interventions: Provide education on orientation to the wound center Notes: ` Pain, Acute or Chronic Nursing Diagnoses: Pain, acute or chronic: actual or potential Haymaker, Gladiola B. ([id number]) Potential alteration in comfort, pain Goals: Patient/caregiver will verbalize adequate pain control between visits Date Initiated: 12/29/2016 Target Resolution Date: 03/21/2017 Goal Status: Active Interventions: Assess comfort goal upon admission Complete pain assessment as per visit requirements Notes: ` Wound/Skin Impairment Nursing Diagnoses: Impaired tissue integrity Knowledge deficit related to smoking impact on wound healing Knowledge deficit related to ulceration/compromised skin integrity Goals: Ulcer/skin breakdown will have a volume reduction of 80% by week 12 Date Initiated: 12/29/2016 Target Resolution Date: 04/11/2017 Goal Status: Active Interventions: Assess patient/caregiver ability to perform ulcer/skin care regimen upon admission and as needed Assess ulceration(s) every visit Notes: Electronic Signature(s) Signed: 01/19/2017 5:10:25 PM By: Alejandro Mulling Entered By: Alejandro Mulling on 01/19/2017  08:09:36 [name], [name] ([id number]) -------------------------------------------------------------------------------- Pain Assessment Details Patient Name: [name] B. Date of Service: 01/19/2017 8:00 AM Medical Record Number: [id number] Patient Account Number: [id number] Date of Birth/Sex: 01/04/1961 (56 y.o. Female) Treating RN: Ashok Cordia, Debi Primary Care Ilda Laskin: WHITE, Lanora Manis Other Clinician: Referring Aretta Stetzel: WHITE, Lanora Manis Treating Pamlea Finder/Extender: STONE III, HOYT Weeks in Treatment: 3 Active Problems Location of Pain Severity and Description of Pain Patient Has Paino No Site Locations With Dressing Change: No Pain Management and Medication Current Pain Management: Electronic Signature(s) Signed: 01/19/2017 5:10:25 PM By: Alejandro Mulling Entered By: Alejandro Mulling on 01/19/2017 08:03:03 [name], [name] ([id number]) -------------------------------------------------------------------------------- Patient/Caregiver Education Details Patient Name: [name] B. Date of Service: 01/19/2017 8:00 AM Medical Record Number: [id number] Patient Account Number: [id number] Date of Birth/Gender: April 16, 1961 (56 y.o. Female) Treating RN: Ashok Cordia, Debi Primary Care Physician: WHITE, Lanora Manis Other Clinician: Referring Physician: WHITE, Lanora Manis Treating Physician/Extender: Skeet Simmer in Treatment: 3 Education Assessment Education Provided To: Patient Education Topics Provided Wound/Skin Impairment: Handouts: Other: change dressing as ordered Methods: Demonstration, Explain/Verbal Responses: State content correctly Electronic Signature(s) Signed: 01/19/2017 5:10:25 PM By: Alejandro Mulling Entered By: Alejandro Mulling on 01/19/2017 08:13:57 [name], [name] ([id number]) -------------------------------------------------------------------------------- Wound Assessment Details Patient Name: [name], [name] Date of Service: 01/19/2017 8:00  AM Medical Record Number: [id number] Patient Account Number: [id number] Date of  Birth/Sex: [birth year] (56 y.o. Female) Treating RN: Ashok Cordia, Debi Primary Care Shemeka Wardle: WHITE, Lanora Manis Other Clinician: Referring Myeisha Kruser: WHITE, ELIZABETH Treating Remberto Lienhard/Extender: STONE III, HOYT Weeks in Treatment: 3 Wound Status Wound Number: 1 Primary Open Surgical Wound Etiology: Wound Location: Right Ischium Wound Open Wounding Event: Surgical Injury Status: Date Acquired: 12/15/2016 Comorbid Chronic Obstructive Pulmonary Weeks Of Treatment: 3 History: Disease (COPD), Hypertension, Clustered Wound: No Cirrhosis , Osteoarthritis Photos Photo Uploaded By: Alejandro Mulling on 01/19/2017 13:11:29 Wound Measurements Length: (cm) 0.4 Width: (cm) 0.5 Depth: (cm) 0.5 Area: (cm) 0.157 Volume: (cm) 0.079 % Reduction in Area: 90% % Reduction in Volume: 91.6% Epithelialization: None Tunneling: No Undermining: Yes Starting Position (o'clock): 12 Maximum Distance: (cm) 0.9 Wound Description Classification: Partial Thickness Wound Margin: Distinct, outline attached Exudate Amount: Large Exudate Type: Serosanguineous Exudate Color: red, brown Foul Odor After Cleansing: No Slough/Fibrino No Wound Bed Granulation Amount: Large (67-100%) [name], [name] ([id number]) Granulation Quality: Red Necrotic Amount: Small (1-33%) Necrotic Quality: Adherent Slough Periwound Skin Texture Texture Color No Abnormalities Noted: No No Abnormalities Noted: No Moisture Temperature / Pain No Abnormalities Noted: No Temperature: No Abnormality Tenderness on Palpation: Yes Wound Preparation Ulcer Cleansing: Rinsed/Irrigated with Saline Topical Anesthetic Applied: Other: lidocaine 4%, Treatment Notes Wound #1 (Right Ischium) 1. Cleansed with: Clean wound with Normal Saline 2. Anesthetic Topical Lidocaine 4% cream to wound bed prior to debridement 3. Peri-wound Care: Skin Prep 7. Secured  with Tape 8. Negative Pressure Wound Therapy Number of foam/gauze pieces used in the dressing = Notes SnapVac one piece of blue foam Electronic Signature(s) Signed: 01/19/2017 5:10:25 PM By: Alejandro Mulling Entered By: Alejandro Mulling on 01/19/2017 08:09:07 [name], [name] ([id number]) -------------------------------------------------------------------------------- Vitals Details Patient Name: Branscome, Shynice B. Date of Service: 01/19/2017 8:00 AM Medical Record Number: [id number] Patient Account Number: [id number] Date of Birth/Sex: 19-Dec-1960 (56 y.o. Female) Treating RN: Ashok Cordia, Debi Primary Care Britanny Marksberry: WHITE, Lanora Manis Other Clinician: Referring Tagen Milby: WHITE, ELIZABETH Treating Alwilda Gilland/Extender: STONE III, HOYT Weeks in Treatment: 3 Vital Signs Time Taken: 08:03 Temperature (F): 97.7 Height (in): 63 Pulse (bpm): 69 Weight (lbs): 187.3 Respiratory Rate (breaths/min): 20 Body Mass Index (BMI): 33.2 Blood Pressure (mmHg): 131/55 Reference Range: 80 - 120 mg / dl Electronic Signature(s) Signed: 01/19/2017 5:10:25 PM By: Alejandro Mulling Entered By: Alejandro Mulling on 01/19/2017 69:62:95

## 2017-01-20 NOTE — Progress Notes (Signed)
Virgin[place] RochesterWORKMAN, [name] ([id number]) Visit Report for 01/19/2017 Chief Complaint Document Details Patient Name: [name], [name] Date of Service: 01/19/2017 8:00 AM Medical Record Number: [id number] Patient Account Number: [id number] Date of Birth/Sex: 1961-06-16 (56 y.o. Female) Treating RN: Ashok CordiaPinkerton, Debi Primary Care Provider: WHITE, Lanora ManisELIZABETH Other Clinician: Referring Provider: WHITE, Lanora ManisELIZABETH Treating Provider/Extender: Linwood DibblesSTONE III, Vernadette Stutsman Weeks in Treatment: 3 Information Obtained from: Patient Chief Complaint Patient presents to the wound care center with open non-healing surgical wound to the right upper thigh Electronic Signature(s) Signed: 01/19/2017 4:54:08 PM By: Lenda KelpStone III, Rondale Nies PA-C Entered By: Lenda KelpStone III, Lianette Broussard on 01/19/2017 08:13:07 [name], [name] ([id number]) -------------------------------------------------------------------------------- HPI Details Patient Name: [name], Saran B. Date of Service: 01/19/2017 8:00 AM Medical Record Number: [id number] Patient Account Number: [id number] Date of Birth/Sex: 1961-06-16 (56 y.o. Female) Treating RN: Ashok CordiaPinkerton, Debi Primary Care Provider: WHITE, Lanora ManisELIZABETH Other Clinician: Referring Provider: WHITE, ELIZABETH Treating Provider/Extender: STONE III, Aarthi Uyeno Weeks in Treatment: 3 History of Present Illness Location: right upper thigh Quality: Patient reports experiencing a dull pain to affected area(s). Severity: Patient states wound are getting better Duration: Patient has had the wound for < 6 weeks prior to presenting for treatment Timing: Pain in wound is Intermittent (comes and goes Context: The wound occurred when the patient had incision and drainage of a hematoma of the right thigh Modifying Factors: Other treatment(s) tried include:washing with dilute hydrogen peroxide Associated Signs and Symptoms: Patient reports having increase discharge. HPI Description: 56 year old female who was admitted to the hospital on 524 and kept  overnight for a right hip hematoma which an incision and drainage was done in the OR. inpatient she was treated with IV antibiotics( vancomycin and Ancef) and discharged home on Bactrim DS. the patient has several comorbidities including a former smoker who quit in January of this year and was also a heavy drinker in the past. past medical history significant for COPD, cirrhosis of the liver, arthritis, tobacco abuse and alcohol abuse. 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 The patient has given up cigarettes in January of this year but smokes E cigarette since then. She has given up a heavy alcohol intake for the last 7 years 01/05/17 patient's right lateral hip wound appears to be doing fairly well on evaluation today. She shows no signs or evidence of significant infection and she has been tolerating the dressing changes without complication. She continues to have some depth and undermining in regard to her wound. Today we are going to initiate the SNAP Vac for her. 01/19/17 Patient appears to be doing well with current treatment. She has no evidence of infection currently and is tolerating the SNAP VAC very well. Electronic Signature(s) Signed: 01/19/2017 4:54:08 PM By: Lenda KelpStone III, Flavius Repsher PA-C Entered By: Lenda KelpStone III, Ebert Forrester on 01/19/2017 08:38:00 [name], [name] ([id number]) -------------------------------------------------------------------------------- Physical Exam Details Patient Name: Copley, Sharmane B. Date of Service: 01/19/2017 8:00 AM Medical Record Number: [id number] Patient Account Number: [id number] Date of Birth/Sex: 1961-06-16 (56 y.o. Female) Treating RN: Ashok CordiaPinkerton, Debi Primary Care Provider: WHITE, Lanora ManisELIZABETH Other Clinician: Referring Provider: WHITE, ELIZABETH Treating Provider/Extender: STONE III, Cherokee Boccio Weeks in Treatment: 3 Constitutional Well-nourished and well-hydrated in no acute distress. Respiratory normal breathing without  difficulty. clear to auscultation bilaterally. Cardiovascular regular rate and rhythm with normal S1, S2. Psychiatric this patient is able to make decisions and demonstrates good insight into disease process. Alert and Oriented x 3. pleasant and cooperative. Notes Patientos wound is improving significantly. No debridement necessary today. Electronic Signature(s)  Signed: 01/19/2017 4:54:08 PM By: Lenda Kelp PA-C Entered By: Lenda Kelp on 01/19/2017 08:39:39 [name], [name] ([id number]) -------------------------------------------------------------------------------- Physician Orders Details Patient Name: Danielle Seen B. Date of Service: 01/19/2017 8:00 AM Medical Record Number: [id number] Patient Account Number: [id number] Date of Birth/Sex: Apr 29, 1961 (56 y.o. 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 o Clean wound with wound cleanser. Anesthetic Wound #1 Right Ischium o Topical Lidocaine 4% cream applied to wound bed prior to debridement Skin Barriers/Peri-Wound Care Wound #1 Right Ischium o Skin Prep Dressing Change Frequency Wound #1 Right Ischium o Change dressing every week Follow-up Appointments Wound #1 Right Ischium o Return Appointment in 1 week. Edema Control Wound #1 Right Ischium o Elevate legs to the level of the heart and pump  ankles as often as possible Additional Orders / Instructions Wound #1 Right Ischium o Stop Smoking Cheatwood, Foy B. ([id number]) o Increase protein intake. Negative Pressure Wound Therapy Wound #1 Right Ischium o Other: - Snap Vac Medications-please add to medication list. Wound #1 Right Ischium o Other: - Vitamin C, Zinc, MVI Notes We will continue with the current wound care measures per above for the next week. If anything worsens in the meantime she will contact us for additional recommendations. Otherwise hopefully she will continue to improve over the next week. Electronic Signature(s) Signed: 01/19/2017 4:54:08 PM By: Lenda Kelp PA-C Entered By: Lenda Kelp on 01/19/2017 08:42:10 Sobolewski, [name] ([id number]) -------------------------------------------------------------------------------- Problem List Details Patient Name: [name], Danielle Lund B. Date of Service: 01/19/2017 8:00 AM Medical Record Number: [id number] Patient Account Number: [id number] Date of Birth/Sex: [birth year] (56 y.o. 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-pressure chronic ulcer of right thigh with necrosis of 12/29/2016 Yes muscle T81.31XA Disruption of external operation (surgical) wound, not 12/29/2016 Yes elsewhere classified, initial encounter 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 dependence, cigarettes, with other nicotine- 12/29/2016 Yes induced disorders Inactive Problems Resolved Problems Electronic Signature(s) Signed: 01/19/2017 4:54:08 PM By: Lenda Kelp PA-C Entered By: Lenda Kelp on 01/19/2017 08:12:27 [name], [name] ([id number]) -------------------------------------------------------------------------------- Progress Note Details Patient Name:  [name], [name] Date of Service: 01/19/2017 8:00 AM Medical Record Number: [id number] Patient Account Number: [id number] Date of Birth/Sex: 1961/02/19 (56 y.o. Female) Treating RN: Ashok Cordia, Debi Primary Care Provider: WHITE, Lanora Manis Other Clinician: Referring Provider: WHITE, Lanora Manis Treating Provider/Extender: Linwood Dibbles, Sequita Wise Weeks in Treatment: 3 Subjective Chief Complaint Information obtained from Patient Patient presents to the wound care center with open non-healing surgical wound to the right upper thigh History of Present Illness (HPI) The following HPI elements were documented for the patient's wound: Location: right upper thigh Quality: Patient reports experiencing a dull pain to affected area(s). Severity: Patient states wound are getting better Duration: Patient has had the wound for < 6 weeks prior to presenting for treatment Timing: Pain in wound is Intermittent (comes and goes Context: The wound occurred when  the patient had incision and drainage of a hematoma of the right thigh Modifying Factors: Other treatment(s) tried include:washing with dilute hydrogen peroxide Associated Signs and Symptoms: Patient reports having increase discharge. 56 year old female who was admitted to the hospital on 524 and kept overnight for a right hip hematoma which an incision and drainage was done in the OR. inpatient she was treated with IV antibiotics ( vancomycin and Ancef) and discharged home on Bactrim DS. the patient has several comorbidities including a former smoker who quit in January of this year and was also a heavy drinker in the past. past medical history significant for COPD, cirrhosis of the liver, arthritis, tobacco abuse and alcohol abuse. 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 The patient has given up cigarettes in January of this year but smokes E cigarette since then. She has given up a heavy alcohol intake for the last  7 years 01/05/17 patient's right lateral hip wound appears to be doing fairly well on evaluation today. She shows no signs or evidence of significant infection and she has been tolerating the dressing changes without complication. She continues to have some depth and undermining in regard to her wound. Today we are going to initiate the SNAP Vac for her. 01/19/17 Patient appears to be doing well with current treatment. She has no evidence of infection currently and is tolerating the SNAP VAC very well. [name], [name] ([id number]) Objective Constitutional Well-nourished and well-hydrated in no acute distress. Vitals Time Taken: 8:03 AM, Height: 63 in, Weight: 187.3 lbs, BMI: 33.2, Temperature: 97.7 F, Pulse: 69 bpm, Respiratory Rate: 20 breaths/min, Blood Pressure: 131/55 mmHg. Respiratory normal breathing without difficulty. clear to auscultation bilaterally. Cardiovascular regular rate and rhythm with normal S1, S2. Psychiatric this patient is able to make decisions and demonstrates good insight into disease process. Alert and Oriented x 3. pleasant and cooperative. General Notes: Patient s wound is improving significantly. No debridement necessary today. Integumentary (Hair, Skin) Wound #1 status is Open. Original cause of wound was Surgical Injury. The wound is located on the Right Ischium. The wound measures 0.4cm length x 0.5cm width x 0.5cm depth; 0.157cm^2 area and 0.079cm^3 volume. There is no tunneling noted, however, there is undermining starting at 12:00 and ending at :00 with a maximum distance of 0.9cm. There is a large amount of serosanguineous drainage noted. The wound margin is distinct with the outline attached to the wound base. There is large (67-100%) red granulation within the wound bed. There is a small (1-33%) amount of necrotic tissue within the wound bed including Adherent Slough. Periwound temperature was noted as No Abnormality. The periwound has tenderness  on palpation. 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 ([id number]) Plan Wound Cleansing: Wound #1 Right Ischium: Clean wound with Normal Saline. Clean wound with wound cleanser. Anesthetic: Wound #1 Right Ischium: Topical Lidocaine 4% cream applied to wound bed prior to debridement Skin Barriers/Peri-Wound Care: Wound #1 Right Ischium: Skin Prep Dressing Change Frequency: Wound #1 Right Ischium: Change dressing every week Follow-up Appointments: Wound #1 Right Ischium: Return Appointment in 1 week. Edema Control: Wound #1 Right Ischium: Elevate legs to the level of the heart and pump ankles as often as possible Additional Orders / Instructions: Wound #1 Right Ischium: Stop Smoking Increase protein  intake. Negative Pressure Wound Therapy: Wound #1 Right Ischium: Other: - Snap Vac Medications-please add to medication list.: Wound #1 Right Ischium: Other: - Vitamin C, Zinc, MVI General Notes: We will continue with the current wound care measures per above for the next week. If anything worsens in the meantime she will contact us for additional recommendations. Otherwise hopefully she will continue to improve over the next week. Electronic Signature(s) Signed: 01/19/2017 4:54:08 PM By: Lenda Kelp PA-C Entered By: Lenda Kelp on 01/19/2017 08:46:08 [name], [name] ([id number]) -------------------------------------------------------------------------------- SuperBill Details Patient Name: Danielle Seen B. Date of Service: 01/19/2017 Medical Record Number: [id number] Patient Account Number: [id number] Date of Birth/Sex: 1960-09-19 (56 y.o. 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 Disruption of external operation (surgical) wound, not elsewhere classified, initial T81.31XA encounter 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 dependence, cigarettes, with other nicotine-induced disorders Facility Procedures CPT4 Code: [id number] Description: 97607 NEG PRESS WND TX <=50 SQ CM Modifier: Quantity: 1 Physician Procedures CPT4: Description Modifier Quantity Code [id number] - WC PHYS LEVEL 3 - EST PT 1 ICD-10 Description Diagnosis 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

## 2017-01-26 ENCOUNTER — Encounter: Payer: Medicare Other | Admitting: Physician Assistant

## 2017-01-26 DIAGNOSIS — L97113 Non-pressure chronic ulcer of right thigh with necrosis of muscle: Secondary | ICD-10-CM | POA: Diagnosis not present

## 2017-01-26 NOTE — Progress Notes (Signed)
[name], [name] ([id number]) Visit Report for 01/26/2017 Arrival Information Details Patient Name: [name], [name] Date of Service: 01/26/2017 12:30 PM Medical Record Number: [id number] Patient Account Number: [id number] Date of Birth/Sex: 1960-08-12 (56 y.o. Female) Treating RN: Ashok CordiaPinkerton, Debi Primary Care Kionna Brier: WHITE, Lanora ManisELIZABETH Other Clinician: Referring Romell Wolden: WHITE, Lanora ManisELIZABETH Treating Marieanne Marxen/Extender: Linwood DibblesSTONE III, HOYT Weeks in Treatment: 4 Visit Information History Since Last Visit All ordered tests and consults were completed: No Patient Arrived: Ambulatory Added or deleted any medications: No Arrival Time: 12:36 Any new allergies or adverse reactions: No Accompanied By: husband Had a fall or experienced change in No Transfer Assistance: None activities of daily living that may affect Patient Identification Verified: Yes risk of falls: Secondary Verification Process Yes Signs or symptoms of abuse/neglect since last No Completed: visito Patient Requires Transmission-Based No Hospitalized since last visit: No Precautions: Has Dressing in Place as Prescribed: Yes Patient Has Alerts: No Pain Present Now: No Electronic Signature(s) Signed: 01/26/2017 3:50:12 PM By: Alejandro MullingPinkerton, Debra Entered By: Alejandro MullingPinkerton, Debra on 01/26/2017 12:38:46 Wieneke, [name] ([id number]) -------------------------------------------------------------------------------- Clinic Level of Care Assessment Details Patient Name: Danielle SeenWORKMAN, Marlee B. Date of Service: 01/26/2017 12:30 PM Medical Record Number: [id number] Patient Account Number: [id number] Date of Birth/Sex: 1960-08-12 (56 y.o. Female) Treating RN: Ashok CordiaPinkerton, Debi Primary Care Ayushi Pla: WHITE, Lanora ManisELIZABETH Other Clinician: Referring Layden Caterino: WHITE, ELIZABETH Treating Lisandro Meggett/Extender: STONE III, HOYT Weeks in Treatment: 4 Clinic Level of Care Assessment Items TOOL 4 Quantity Score X - Use when only an EandM is performed on FOLLOW-UP  visit 1 0 ASSESSMENTS - Nursing Assessment / Reassessment X - Reassessment of Co-morbidities (includes updates in patient status) 1 10 X - Reassessment of Adherence to Treatment Plan 1 5 ASSESSMENTS - Wound and Skin Assessment / Reassessment X - Simple Wound Assessment / Reassessment - one wound 1 5 []  - Complex Wound Assessment / Reassessment - multiple wounds 0 []  - Dermatologic / Skin Assessment (not related to wound area) 0 ASSESSMENTS - Focused Assessment []  - Circumferential Edema Measurements - multi extremities 0 []  - Nutritional Assessment / Counseling / Intervention 0 []  - Lower Extremity Assessment (monofilament, tuning fork, pulses) 0 []  - Peripheral Arterial Disease Assessment (using hand held doppler) 0 ASSESSMENTS - Ostomy and/or Continence Assessment and Care []  - Incontinence Assessment and Management 0 []  - Ostomy Care Assessment and Management (repouching, etc.) 0 PROCESS - Coordination of Care X - Simple Patient / Family Education for ongoing care 1 15 []  - Complex (extensive) Patient / Family Education for ongoing care 0 []  - Staff obtains ChiropractorConsents, Records, Test Results / Process Orders 0 []  - Staff telephones HHA, Nursing Homes / Clarify orders / etc 0 []  - Routine Transfer to another Facility (non-emergent condition) 0 Scheerer, [name] ([id number]) []  - Routine Hospital Admission (non-emergent condition) 0 []  - New Admissions / Manufacturing engineernsurance Authorizations / Ordering NPWT, Apligraf, etc. 0 []  - Emergency Hospital Admission (emergent condition) 0 X - Simple Discharge Coordination 1 10 []  - Complex (extensive) Discharge Coordination 0 PROCESS - Special Needs []  - Pediatric / Minor Patient Management 0 []  - Isolation Patient Management 0 []  - Hearing / Language / Visual special needs 0 []  - Assessment of Community assistance (transportation, D/C planning, etc.) 0 []  - Additional assistance / Altered mentation 0 []  - Support Surface(s) Assessment (bed, cushion, seat,  etc.) 0 INTERVENTIONS - Wound Cleansing / Measurement X - Simple Wound Cleansing - one wound 1 5 []  - Complex Wound Cleansing - multiple wounds 0 X - Wound Imaging (  photographs - any number of wounds) 1 5 []  - Wound Tracing (instead of photographs) 0 X - Simple Wound Measurement - one wound 1 5 []  - Complex Wound Measurement - multiple wounds 0 INTERVENTIONS - Wound Dressings X - Small Wound Dressing one or multiple wounds 1 10 []  - Medium Wound Dressing one or multiple wounds 0 []  - Large Wound Dressing one or multiple wounds 0 X - Application of Medications - topical 1 5 []  - Application of Medications - injection 0 INTERVENTIONS - Miscellaneous []  - External ear exam 0 [name], [name] ([id number]) []  - Specimen Collection (cultures, biopsies, blood, body fluids, etc.) 0 []  - Specimen(s) / Culture(s) sent or taken to Lab for analysis 0 []  - Patient Transfer (multiple staff / Michiel Sites Lift / Similar devices) 0 []  - Simple Staple / Suture removal (25 or less) 0 []  - Complex Staple / Suture removal (26 or more) 0 []  - Hypo / Hyperglycemic Management (close monitor of Blood Glucose) 0 []  - Ankle / Brachial Index (ABI) - do not check if billed separately 0 X - Vital Signs 1 5 Has the patient been seen at the hospital within the last three years: Yes Total Score: 80 Level Of Care: New/Established - Level 3 Electronic Signature(s) Signed: 01/26/2017 3:50:12 PM By: Alejandro Mulling Entered By: Alejandro Mulling on 01/26/2017 13:18:54 Bozzo, [name] ([id number]) -------------------------------------------------------------------------------- Encounter Discharge Information Details Patient Name: Danielle Seen B. Date of Service: 01/26/2017 12:30 PM Medical Record Number: [id number] Patient Account Number: [id number] Date of Birth/Sex: 17-Sep-1960 (56 y.o. Female) Treating RN: Ashok Cordia, Debi Primary Care Avamae Dehaan: WHITE, Lanora Manis Other Clinician: Referring Cassidy Tabet: WHITE,  Lanora Manis Treating Kaliq Lege/Extender: Linwood Dibbles, HOYT Weeks in Treatment: 4 Encounter Discharge Information Items Discharge Pain Level: 0 Discharge Condition: Stable Ambulatory Status: Ambulatory Discharge Destination: Home Transportation: Private Auto Accompanied By: husband Schedule Follow-up Appointment: Yes Medication Reconciliation completed and provided to Patient/Care No Deiontae Rabel: Provided on Clinical Summary of Care: 01/26/2017 Form Type Recipient Paper Patient JW Electronic Signature(s) Signed: 01/26/2017 1:03:16 PM By: Gwenlyn Perking Entered By: Gwenlyn Perking on 01/26/2017 13:03:16 Dacanay, [name] ([id number]) -------------------------------------------------------------------------------- Lower Extremity Assessment Details Patient Name: [name], [name] B. Date of Service: 01/26/2017 12:30 PM Medical Record Number: [id number] Patient Account Number: [id number] Date of Birth/Sex: 1960-12-13 (55 y.o. Female) Treating RN: Phillis Haggis Primary Care Tinley Rought: Doristine Mango Other Clinician: Referring Lorrain Rivers: WHITE, Lanora Manis Treating Allesha Aronoff/Extender: Linwood Dibbles, HOYT Weeks in Treatment: 4 Electronic Signature(s) Signed: 01/26/2017 3:50:12 PM By: Alejandro Mulling Entered By: Alejandro Mulling on 01/26/2017 12:45:46 Homesley, [name] ([id number]) -------------------------------------------------------------------------------- Multi Wound Chart Details Patient Name: Voiles, Lynnetta B. Date of Service: 01/26/2017 12:30 PM Medical Record Number: [id number] Patient Account Number: [id number] Date of Birth/Sex: Apr 10, 1961 (56 y.o. Female) Treating RN: Ashok Cordia, Debi Primary Care October Peery: WHITE, Lanora Manis Other Clinician: Referring Millicent Blazejewski: WHITE, ELIZABETH Treating Kambre Messner/Extender: STONE III, HOYT Weeks in Treatment: 4 Vital Signs Height(in): 63 Pulse(bpm): 62 Weight(lbs): 187.3 Blood Pressure 130/60 (mmHg): Body Mass Index(BMI): 33 Temperature(F):  98.0 Respiratory Rate 20 (breaths/min): Photos: [1:No Photos] [N/A:N/A] Wound Location: [1:Right Ischium] [N/A:N/A] Wounding Event: [1:Surgical Injury] [N/A:N/A] Primary Etiology: [1:Open Surgical Wound] [N/A:N/A] Comorbid History: [1:Chronic Obstructive Pulmonary Disease (COPD), Hypertension, Cirrhosis , Osteoarthritis] [N/A:N/A] Date Acquired: [1:12/15/2016] [N/A:N/A] Weeks of Treatment: [1:4] [N/A:N/A] Wound Status: [1:Open] [N/A:N/A] Measurements L x W x D 0.3x0.2x0.5 [N/A:N/A] (cm) Area (cm) : [1:0.047] [N/A:N/A] Volume (cm) : [1:0.024] [N/A:N/A] % Reduction in Area: [1:97.00%] [N/A:N/A] % Reduction in Volume: 97.50% [N/A:N/A] Starting Position 1 1 (o'clock): Ending Position 1 (o'clock): Maximum  Distance 1 1.5 (cm): Undermining: [1:Yes] [N/A:N/A] Classification: [1:Partial Thickness] [N/A:N/A] Exudate Amount: [1:Large] [N/A:N/A] Exudate Type: [1:Serosanguineous] [N/A:N/A] Exudate Color: [1:red, brown] [N/A:N/A] Wound Margin: [1:Distinct, outline attached] [N/A:N/A] Granulation Amount: [1:Large (67-100%)] [N/A:N/A] Granulation Quality: Red N/A N/A Necrotic Amount: None Present (0%) N/A N/A Epithelialization: None N/A N/A Periwound Skin Texture: No Abnormalities Noted N/A N/A Periwound Skin No Abnormalities Noted N/A N/A Moisture: Periwound Skin Color: No Abnormalities Noted N/A N/A Temperature: No Abnormality N/A N/A Tenderness on Yes N/A N/A Palpation: Wound Preparation: Ulcer Cleansing: N/A N/A Rinsed/Irrigated with Saline Topical Anesthetic Applied: Other: lidocaine 4% Treatment Notes Electronic Signature(s) Signed: 01/26/2017 3:50:12 PM By: Alejandro Mulling Entered By: Alejandro Mulling on 01/26/2017 12:46:04 Sager, [name] ([id number]) -------------------------------------------------------------------------------- Multi-Disciplinary Care Plan Details Patient Name: Danielle Seen B. Date of Service: 01/26/2017 12:30 PM Medical Record Number:  [id number] Patient Account Number: [id number] Date of Birth/Sex: [birth year] (56 y.o. Female) Treating RN: Ashok Cordia, Debi Primary Care Tarnisha Kachmar: WHITE, Lanora Manis Other Clinician: Referring Franny Selvage: WHITE, Lanora Manis Treating Muaad Boehning/Extender: Linwood Dibbles, HOYT Weeks in Treatment: 4 Active Inactive ` Abuse / Safety / Falls / Self Care Management Nursing Diagnoses: Potential for falls Goals: Patient will remain injury free related to falls Date Initiated: 12/29/2016 Target Resolution Date: 04/18/2017 Goal Status: Active Interventions: Assess personal safety and home safety (as indicated) on admission and as needed Assess self care needs on admission and as needed Notes: ` Orientation to the Wound Care Program Nursing Diagnoses: Knowledge deficit related to the wound healing center program Goals: Patient/caregiver will verbalize understanding of the Wound Healing Center Program Date Initiated: 12/29/2016 Target Resolution Date: 01/17/2017 Goal Status: Active Interventions: Provide education on orientation to the wound center Notes: ` Pain, Acute or Chronic Nursing Diagnoses: Pain, acute or chronic: actual or potential Chuang, Prapti B. ([id number]) Potential alteration in comfort, pain Goals: Patient/caregiver will verbalize adequate pain control between visits Date Initiated: 12/29/2016 Target Resolution Date: 03/21/2017 Goal Status: Active Interventions: Assess comfort goal upon admission Complete pain assessment as per visit requirements Notes: ` Wound/Skin Impairment Nursing Diagnoses: Impaired tissue integrity Knowledge deficit related to smoking impact on wound healing Knowledge deficit related to ulceration/compromised skin integrity Goals: Ulcer/skin breakdown will have a volume reduction of 80% by week 12 Date Initiated: 12/29/2016 Target Resolution Date: 04/11/2017 Goal Status: Active Interventions: Assess patient/caregiver ability to perform ulcer/skin care  regimen upon admission and as needed Assess ulceration(s) every visit Notes: Electronic Signature(s) Signed: 01/26/2017 3:50:12 PM By: Alejandro Mulling Entered By: Alejandro Mulling on 01/26/2017 12:45:58 Nicolls, [name] ([id number]) -------------------------------------------------------------------------------- Pain Assessment Details Patient Name: Danielle Seen B. Date of Service: 01/26/2017 12:30 PM Medical Record Number: [id number] Patient Account Number: [id number] Date of Birth/Sex: 06-Jun-1961 (56 y.o. Female) Treating RN: Ashok Cordia, Debi Primary Care Naquita Nappier: WHITE, Lanora Manis Other Clinician: Referring Alishah Schulte: WHITE, Lanora Manis Treating Pryor Guettler/Extender: STONE III, HOYT Weeks in Treatment: 4 Active Problems Location of Pain Severity and Description of Pain Patient Has Paino No Site Locations With Dressing Change: No Pain Management and Medication Current Pain Management: Electronic Signature(s) Signed: 01/26/2017 3:50:12 PM By: Alejandro Mulling Entered By: Alejandro Mulling on 01/26/2017 12:38:52 Dreese, [name] ([id number]) -------------------------------------------------------------------------------- Patient/Caregiver Education Details Patient Name: Danielle Seen B. Date of Service: 01/26/2017 12:30 PM Medical Record Number: [id number] Patient Account Number: [id number] Date of Birth/Gender: 08-15-1960 (56 y.o. Female) Treating RN: Ashok Cordia, Debi Primary Care Physician: WHITE, Lanora Manis Other Clinician: Referring Physician: WHITE, Lanora Manis Treating Physician/Extender: Skeet Simmer in Treatment: 4 Education Assessment Education Provided To: Patient Education Topics Provided Wound/Skin Impairment: Handouts: Other: change  dressing as ordered Methods: Demonstration, Explain/Verbal Responses: State content correctly Electronic Signature(s) Signed: 01/26/2017 3:50:12 PM By: Alejandro Mulling Entered By: Alejandro Mulling on 01/26/2017 12:49:14 Romas,  [name] ([id number]) -------------------------------------------------------------------------------- Wound Assessment Details Patient Name: [name], [name] Date of Service: 01/26/2017 12:30 PM Medical Record Number: [id number] Patient Account Number: [id number] Date of Birth/Sex: 1961/05/04 (56 y.o. Female) Treating RN: Ashok Cordia, Debi Primary Care Sheala Dosh: WHITE, Lanora Manis Other Clinician: Referring Debany Vantol: WHITE, ELIZABETH Treating Lorey Pallett/Extender: STONE III, HOYT Weeks in Treatment: 4 Wound Status Wound Number: 1 Primary Open Surgical Wound Etiology: Wound Location: Right Ischium Wound Open Wounding Event: Surgical Injury Status: Date Acquired: 12/15/2016 Comorbid Chronic Obstructive Pulmonary Weeks Of Treatment: 4 History: Disease (COPD), Hypertension, Clustered Wound: No Cirrhosis , Osteoarthritis Photos Photo Uploaded By: Alejandro Mulling on 01/26/2017 13:22:38 Wound Measurements Length: (cm) 0.3 Width: (cm) 0.2 Depth: (cm) 0.5 Area: (cm) 0.047 Volume: (cm) 0.024 % Reduction in Area: 97% % Reduction in Volume: 97.5% Epithelialization: None Tunneling: No Undermining: Yes Starting Position (o'clock): 1 Maximum Distance: (cm) 1.5 Wound Description Classification: Partial Thickness Wound Margin: Distinct, outline attached Exudate Amount: Large Exudate Type: Serosanguineous Exudate Color: red, brown Foul Odor After Cleansing: No Slough/Fibrino No Wound Bed Granulation Amount: Large (67-100%) Heggs, [name] ([id number]) Granulation Quality: Red Necrotic Amount: None Present (0%) Periwound Skin Texture Texture Color No Abnormalities Noted: No No Abnormalities Noted: No Moisture Temperature / Pain No Abnormalities Noted: No Temperature: No Abnormality Tenderness on Palpation: Yes Wound Preparation Ulcer Cleansing: Rinsed/Irrigated with Saline Topical Anesthetic Applied: Other: lidocaine 4%, Treatment Notes Wound #1 (Right Ischium) 1. Cleansed  with: Clean wound with Normal Saline 2. Anesthetic Topical Lidocaine 4% cream to wound bed prior to debridement 3. Peri-wound Care: Skin Prep 4. Dressing Applied: Iodoform packing Gauze 5. Secondary Dressing Applied Dry Gauze [place] Electronic Signature(s) Signed: 01/26/2017 3:50:12 PM By: Alejandro Mulling Entered By: Alejandro Mulling on 01/26/2017 12:44:36 Golay, [name] ([id number]) -------------------------------------------------------------------------------- Vitals Details Patient Name: Danielle Seen B. Date of Service: 01/26/2017 12:30 PM Medical Record Number: [id number] Patient Account Number: [id number] Date of Birth/Sex: 06-16-1961 (56 y.o. Female) Treating RN: Ashok Cordia, Debi Primary Care Leverne Amrhein: WHITE, Lanora Manis Other Clinician: Referring Dondi Burandt: WHITE, ELIZABETH Treating Dewell [name]/Extender: STONE III, HOYT Weeks in Treatment: 4 Vital Signs Time Taken: 12:38 Temperature (F): 98.0 Height (in): 63 Pulse (bpm): 62 Weight (lbs): 187.3 Respiratory Rate (breaths/min): 20 Body Mass Index (BMI): 33.2 Blood Pressure (mmHg): 130/60 Reference Range: 80 - 120 mg / dl Electronic Signature(s) Signed: 01/26/2017 3:50:12 PM By: Alejandro Mulling Entered By: Alejandro Mulling on 01/26/2017 12:40:38

## 2017-01-26 NOTE — Progress Notes (Signed)
Danielle, [name] ([id number]) Visit Report for 01/26/2017 Chief Complaint Document Details Patient Name: [name], [name]. Date of Service: 01/26/2017 12:30 PM Medical Record Number: [id number] Patient Account Number: [id number] Date of Birth/Sex: [birth year] (56 y.o. Female) Treating RN: Ashok Cordia, Debi Primary Care Provider: WHITE, Lanora Manis Other Clinician: Referring Provider: WHITE, Lanora Manis Treating Provider/Extender: Linwood Dibbles, HOYT Weeks in Treatment: 4 Information Obtained from: Patient Chief Complaint Patient presents to the wound care center with open non-healing surgical wound to the right upper thigh Electronic Signature(s) Signed: 01/26/2017 3:36:45 PM By: Lenda Kelp PA-C Entered By: Lenda Kelp on 01/26/2017 12:59:19 Kohlman, Nicki [name] ([id number]) -------------------------------------------------------------------------------- HPI Details Patient Name: [name]. Date of Service: 01/26/2017 12:30 PM Medical Record Number: [id number] Patient Account Number: [id number] Date of Birth/Sex: 1961-03-18 (56 y.o. Female) Treating RN: Ashok Cordia, Debi Primary Care Provider: WHITE, Lanora Manis Other Clinician: Referring Provider: WHITE, ELIZABETH Treating Provider/Extender: STONE III, HOYT Weeks in Treatment: 4 History of Present Illness Location: right upper thigh Quality: Patient reports experiencing a dull pain to affected area(s). Severity: Patient states wound are getting better Duration: Patient has had the wound for < 6 weeks prior to presenting for treatment Timing: Pain in wound is Intermittent (comes and goes Context: The wound occurred when the patient had incision and drainage of a hematoma of the right thigh Modifying Factors: Other treatment(s) tried include:washing with dilute hydrogen peroxide Associated Signs and Symptoms: Patient reports having increase discharge. HPI Description: 56 year old female who was admitted to the hospital on 524 and kept  overnight for a right hip hematoma which an incision and drainage was done in the OR. inpatient she was treated with IV antibiotics( vancomycin and Ancef) and discharged home on Bactrim DS. the patient has several comorbidities including a former smoker who quit in January of this year and was also a heavy drinker in the past. past medical history significant for COPD, cirrhosis of the liver, arthritis, tobacco abuse and alcohol abuse. 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 The patient has given up cigarettes in January of this year but smokes E cigarette since then. She has given up a heavy alcohol intake for the last 7 years 01/05/17 patient's right lateral hip wound appears to be doing fairly well on evaluation today. She shows no signs or evidence of significant infection and she has been tolerating the dressing changes without complication. She continues to have some depth and undermining in regard to her wound. Today we are going to initiate the SNAP Vac for her. 01/19/17 Patient appears to be doing well with current treatment. She has no evidence of infection currently and is tolerating the SNAP VAC very well. 01/26/17 on evaluation today patient appears to be doing well with the current wound VAC. With that being said the whole has gotten to the point that we are really no longer able to pack this with the foam for the vac. For that reason it appears that we are going to have to do something different going forward today. Fortunately there is no immediate discomfort in the region of her wound and there's no evidence of infection. Electronic Signature(s) Signed: 01/26/2017 3:36:45 PM By: Lenda Kelp PA-C Entered By: Lenda Kelp on 01/26/2017 12:59:38 [place], Nicki [name] ([id number]) -------------------------------------------------------------------------------- Physical Exam Details Patient Name: [name]. Date of Service:  01/26/2017 12:30 PM Medical Record Number: [id number] Patient Account Number: [id number] Date of Birth/Sex: 05/24/1961 (56 y.o. Female) Treating RN: Phillis Haggis Primary Care Provider:  WHITE, ELIZABETH Other Clinician: Referring Provider: WHITE, ELIZABETH Treating Provider/Extender: STONE III, HOYT Weeks in Treatment: 4 Constitutional Well-nourished and well-hydrated in no acute distress. Respiratory normal breathing without difficulty. clear to auscultation bilaterally. Cardiovascular regular rate and rhythm with normal S1, S2. Psychiatric this patient is able to make decisions and demonstrates good insight into disease process. Alert and Oriented x 3. pleasant and cooperative. Notes Patient still has a small opening in regard to the right lateral hip region which has yet to close although overall she appears to be doing much better in regard to this wound. There is no evidence of infection although we are not going to be able to apply the Wound VAC due to not being able to pack the phone going forward. Electronic Signature(s) Signed: 01/26/2017 3:36:45 PM By: Lenda KelpStone III, Hoyt PA-C Entered By: Lenda KelpStone III, Hoyt on 01/26/2017 13:00:29 Sevcik, Nicki GuadalajaraJANET [name]. ([id number]) -------------------------------------------------------------------------------- Physician Orders Details Patient Name: Danielle SeenWORKMAN, [name]. Date of Service: 01/26/2017 12:30 PM Medical Record Number: [id number] Patient Account Number: [id number] Date of Birth/Sex: Mar 12, 1961 (56 y.o. 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 o Clean wound with wound cleanser. Anesthetic Wound #1 Right Ischium o Topical Lidocaine 4% cream applied to wound bed prior to debridement Skin Barriers/Peri-Wound Care Wound #1 Right Ischium o Skin Prep Primary Wound Dressing Wound #1 Right Ischium o Iodoform packing Gauze Secondary Dressing Wound #1 Right Ischium o Dry Gauze o Non-adherent pad - telfa island Dressing Change Frequency Wound #1 Right Ischium o Change dressing every day. Follow-up Appointments Wound #1 Right Ischium Danielle SeenWORKMAN, Winry [name]. ([id number]) o Return Appointment in 1 week. Edema Control Wound #1 Right Ischium o Elevate legs to the level of the heart and pump ankles as often as possible Additional Orders / Instructions Wound #1 Right Ischium o Stop Smoking o Increase protein intake. Medications-please add to medication list. Wound #1 Right Ischium o Other: - Vitamin C, Zinc, MVI Notes I'm going to recommend that we switch her to Iodoform packing for the next week. I believe this will help the wound to continue to fill in which will be beneficial for her. If anything worsens in the interim she will contact our office for additional recommendations. Otherwise we will see her for reevaluation in one week. Please see above for additional wound care orders. Electronic Signature(s) Signed: 01/26/2017 3:36:45 PM By: Lenda KelpStone III, Hoyt PA-C Entered By: Lenda KelpStone III, Hoyt on 01/26/2017 13:01:09 Zia, Nicki GuadalajaraJANET [name]. ([id number]) -------------------------------------------------------------------------------- Problem List Details Patient Name: Danielle SeenWORKMAN, Shia [name]. Date of Service: 01/26/2017 12:30 PM Medical Record Number: [id number] Patient Account Number: [id number] Date of Birth/Sex:  Mar 12, 1961 (56 y.o. 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 Date of Service: 01/26/2017 12:30 PM Medical Record Number: [id number] Patient Account Number: [id number] Date of Birth/Sex: January 27, 1961 (56 y.o. Female) Treating RN: Ashok Cordia, Debi Primary Care Provider: WHITE, Lanora Manis Other Clinician: Referring Provider: WHITE, Lanora Manis Treating Provider/Extender: Linwood Dibbles, HOYT Weeks in Treatment: 4 Subjective Chief Complaint Information obtained from Patient Patient presents to the wound care center with open non-healing surgical wound to the right upper thigh History of Present Illness (HPI) The following HPI elements were documented for the patient's wound: Location: right upper thigh Quality: Patient reports experiencing a dull pain to affected area(s). Severity: Patient states wound are getting better Duration: Patient has had the wound for < 6 weeks prior to presenting for treatment Timing: Pain in wound is Intermittent (comes and goes Context: The wound occurred when the patient had incision and drainage of a hematoma of the  right thigh Modifying Factors: Other treatment(s) tried include:washing with dilute hydrogen peroxide Associated Signs and Symptoms: Patient reports having increase discharge. 56 year old female who was admitted to the hospital on 524 and kept overnight for a right hip hematoma which an incision and drainage was done in the OR. inpatient she was treated with IV antibiotics ( vancomycin and Ancef) and discharged home on Bactrim DS. the patient has several comorbidities including a former smoker who quit in January of this year and was also a heavy drinker in the past. past medical history significant for COPD, cirrhosis of the liver, arthritis, tobacco abuse and alcohol abuse. 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 The patient has given up cigarettes in January of this year but smokes E cigarette since then. She has given up a heavy alcohol intake for the last 7 years 01/05/17 patient's right lateral hip wound appears to be doing fairly well on evaluation today. She shows no signs or evidence of significant infection and she has been tolerating the dressing changes without complication. She continues to have some depth and undermining in regard to her wound. Today we are going to initiate the SNAP Vac for her. 01/19/17 Patient appears to be doing well with current treatment. She has no evidence of infection currently and is tolerating the SNAP VAC very well. 01/26/17 on evaluation today patient appears to be doing well with the current wound VAC. With that being said the whole has gotten to the point that we are really no longer able to pack this with the foam for the vac. For that reason it appears that we are going to have to do something different going forward today. Fortunately there is no immediate discomfort in the region of her wound and there's no evidence of infection. Weisner, Judianne [name]. ([id number]) Objective Constitutional Well-nourished and  well-hydrated in no acute distress. Vitals Time Taken: 12:38 PM, Height: 63 in, Weight: 187.3 lbs, BMI: 33.2, Temperature: 98.0 F, Pulse: 62 bpm, Respiratory Rate: 20 breaths/min, Blood Pressure: 130/60 mmHg. Respiratory normal breathing without difficulty. clear to auscultation bilaterally. Cardiovascular regular rate and rhythm with normal S1, S2. Psychiatric this patient is able to make decisions and demonstrates good insight into disease process. Alert and Oriented x 3. pleasant and cooperative. General Notes: Patient still has a small opening in regard to the right lateral hip region which has yet to close although overall she  appears to be doing much better in regard to this wound. There is no evidence of infection although we are not going to be able to apply the Wound VAC due to not being able to pack the phone going forward. Integumentary (Hair, Skin) Wound #1 status is Open. Original cause of wound was Surgical Injury. The wound is located on the Right Ischium. The wound measures 0.3cm length x 0.2cm width x 0.5cm depth; 0.047cm^2 area and 0.024cm^3 volume. There is no tunneling noted, however, there is undermining starting at 1:00 and ending at :00 with a maximum distance of 1.5cm. There is a large amount of serosanguineous drainage noted. The wound margin is distinct with the outline attached to the wound base. There is large (67-100%) red granulation within the wound bed. There is no necrotic tissue within the wound bed. Periwound temperature was noted as No Abnormality. The periwound has tenderness on palpation. 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 Clean wound with wound cleanser. Anesthetic: Wound #1 Right Ischium: Topical Lidocaine 4% cream applied to wound bed prior to debridement Skin Barriers/Peri-Wound Care: Wound #1 Right Ischium: Skin Prep Primary Wound Dressing: Wound #1 Right Ischium: Iodoform packing Gauze Secondary Dressing: Wound #1 Right Ischium: Dry Gauze Non-adherent pad - telfa island Dressing Change Frequency: Wound #1 Right Ischium: Change dressing every day. Follow-up Appointments: Wound #1 Right Ischium: Return Appointment in 1 week. Edema Control: Wound #1 Right Ischium: Elevate legs to the level of the heart and pump ankles as often as possible Additional Orders / Instructions: Wound #1 Right Ischium: Stop Smoking Increase protein intake. Medications-please add to medication list.: Wound #1 Right Ischium: Other: - Vitamin C, Zinc, MVI General Notes: I'm going to recommend that we switch her to Iodoform packing for the next week. I believe this will help the wound to continue to fill in which will be beneficial for her. If anything worsens in the interim she will contact our office for additional recommendations. Otherwise we will see her for reevaluation in one week. Please see above for additional wound care orders. FERNANDA, TWADDELL ([id number]) Electronic Signature(s) Signed: 01/26/2017 3:36:45 PM By: Lenda Kelp PA-C Entered By: Lenda Kelp on 01/26/2017 13:01:20 Papin, Nicki [name] ([id number]) -------------------------------------------------------------------------------- SuperBill Details Patient Name: [name]. Date of Service: 01/26/2017 Medical Record Number: [id number] Patient Account Number: [id number] Date of Birth/Sex: March 21, 1961 (56 y.o. 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 Disruption of external operation (surgical) wound, not elsewhere classified, initial T81.31XA encounter 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 dependence, cigarettes, with other nicotine-induced disorders Facility Procedures CPT4 Code: 62952841 Description: 99213 - WOUND CARE VISIT-LEV 3 EST PT Modifier: Quantity: 1 Physician Procedures CPT4: Description Modifier Quantity Code [id number] 99213 - WC PHYS LEVEL 3 - EST PT 1 ICD-10 Description Diagnosis 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

## 2017-01-29 ENCOUNTER — Encounter: Payer: Self-pay | Admitting: *Deleted

## 2017-01-30 ENCOUNTER — Encounter: Payer: Self-pay | Admitting: Registered Nurse

## 2017-01-30 ENCOUNTER — Ambulatory Visit
Admission: RE | Admit: 2017-01-30 | Discharge: 2017-01-30 | Disposition: A | Discharge status: Home / Self Care | Payer: Medicare Other | Source: Ambulatory Visit | Attending: Gastroenterology | Admitting: Gastroenterology

## 2017-01-30 ENCOUNTER — Encounter
Admission: RE | Disposition: A | Discharge status: Home / Self Care | Payer: Self-pay | Source: Ambulatory Visit | Attending: Gastroenterology

## 2017-01-30 ENCOUNTER — Ambulatory Visit: Payer: Medicare Other | Admitting: Anesthesiology

## 2017-01-30 DIAGNOSIS — Z96641 Presence of right artificial hip joint: Secondary | ICD-10-CM | POA: Insufficient documentation

## 2017-01-30 DIAGNOSIS — K644 Residual hemorrhoidal skin tags: Secondary | ICD-10-CM | POA: Diagnosis not present

## 2017-01-30 DIAGNOSIS — D509 Iron deficiency anemia, unspecified: Secondary | ICD-10-CM | POA: Insufficient documentation

## 2017-01-30 DIAGNOSIS — K7460 Unspecified cirrhosis of liver: Secondary | ICD-10-CM | POA: Diagnosis not present

## 2017-01-30 DIAGNOSIS — K2970 Gastritis, unspecified, without bleeding: Secondary | ICD-10-CM | POA: Diagnosis not present

## 2017-01-30 DIAGNOSIS — K2950 Unspecified chronic gastritis without bleeding: Secondary | ICD-10-CM | POA: Diagnosis not present

## 2017-01-30 DIAGNOSIS — K621 Rectal polyp: Secondary | ICD-10-CM | POA: Insufficient documentation

## 2017-01-30 DIAGNOSIS — Z87891 Personal history of nicotine dependence: Secondary | ICD-10-CM | POA: Diagnosis not present

## 2017-01-30 DIAGNOSIS — J449 Chronic obstructive pulmonary disease, unspecified: Secondary | ICD-10-CM | POA: Insufficient documentation

## 2017-01-30 DIAGNOSIS — K3189 Other diseases of stomach and duodenum: Secondary | ICD-10-CM | POA: Diagnosis not present

## 2017-01-30 DIAGNOSIS — K5730 Diverticulosis of large intestine without perforation or abscess without bleeding: Secondary | ICD-10-CM | POA: Insufficient documentation

## 2017-01-30 DIAGNOSIS — K219 Gastro-esophageal reflux disease without esophagitis: Secondary | ICD-10-CM | POA: Insufficient documentation

## 2017-01-30 DIAGNOSIS — I10 Essential (primary) hypertension: Secondary | ICD-10-CM | POA: Diagnosis not present

## 2017-01-30 DIAGNOSIS — Z7951 Long term (current) use of inhaled steroids: Secondary | ICD-10-CM | POA: Diagnosis not present

## 2017-01-30 DIAGNOSIS — Z791 Long term (current) use of non-steroidal anti-inflammatories (NSAID): Secondary | ICD-10-CM | POA: Diagnosis not present

## 2017-01-30 DIAGNOSIS — Z79899 Other long term (current) drug therapy: Secondary | ICD-10-CM | POA: Insufficient documentation

## 2017-01-30 DIAGNOSIS — K259 Gastric ulcer, unspecified as acute or chronic, without hemorrhage or perforation: Secondary | ICD-10-CM | POA: Insufficient documentation

## 2017-01-30 HISTORY — PX: ESOPHAGOGASTRODUODENOSCOPY (EGD) WITH PROPOFOL: SHX5813

## 2017-01-30 HISTORY — DX: Cardiac murmur, unspecified: R01.1

## 2017-01-30 HISTORY — PX: COLONOSCOPY WITH PROPOFOL: SHX5780

## 2017-01-30 HISTORY — DX: Alcohol dependence, uncomplicated: F10.20

## 2017-01-30 HISTORY — DX: Urinary tract infection, site not specified: N39.0

## 2017-01-30 SURGERY — COLONOSCOPY WITH PROPOFOL
Anesthesia: General

## 2017-01-30 MED ORDER — SODIUM CHLORIDE 0.9 % IV SOLN
INTRAVENOUS | Status: AC
  Filled 2017-01-30: qty 2000

## 2017-01-30 MED ORDER — LIDOCAINE HCL (CARDIAC) 20 MG/ML IV SOLN
INTRAVENOUS | Status: DC | PRN
  Administered 2017-01-30: 80 mg via INTRAVENOUS

## 2017-01-30 MED ORDER — IPRATROPIUM-ALBUTEROL 0.5-2.5 (3) MG/3ML IN SOLN
RESPIRATORY_TRACT | Status: AC
  Filled 2017-01-30: qty 3

## 2017-01-30 MED ORDER — PROPOFOL 500 MG/50ML IV EMUL
INTRAVENOUS | Status: AC
  Filled 2017-01-30: qty 50

## 2017-01-30 MED ORDER — IPRATROPIUM-ALBUTEROL 0.5-2.5 (3) MG/3ML IN SOLN
3.0000 mL | Freq: Once | RESPIRATORY_TRACT | Status: AC
  Administered 2017-01-30: 3 mL via RESPIRATORY_TRACT

## 2017-01-30 MED ORDER — SODIUM CHLORIDE 0.9 % IV SOLN
INTRAVENOUS | Status: DC
  Administered 2017-01-30: 09:00:00 via INTRAVENOUS

## 2017-01-30 MED ORDER — FENTANYL CITRATE (PF) 100 MCG/2ML IJ SOLN
INTRAMUSCULAR | Status: DC | PRN
  Administered 2017-01-30: 50 ug via INTRAVENOUS

## 2017-01-30 MED ORDER — SODIUM CHLORIDE 0.9 % IV SOLN
2.0000 g | Freq: Once | INTRAVENOUS | Status: AC
  Administered 2017-01-30: 2 g via INTRAVENOUS

## 2017-01-30 MED ORDER — GLYCOPYRROLATE 0.2 MG/ML IJ SOLN
INTRAMUSCULAR | Status: DC | PRN
  Administered 2017-01-30: 0.2 mg via INTRAVENOUS

## 2017-01-30 MED ORDER — PROPOFOL 500 MG/50ML IV EMUL
INTRAVENOUS | Status: DC | PRN
  Administered 2017-01-30: 140 ug/kg/min via INTRAVENOUS

## 2017-01-30 MED ORDER — MIDAZOLAM HCL 2 MG/2ML IJ SOLN
INTRAMUSCULAR | Status: DC | PRN
  Administered 2017-01-30 (×2): 1 mg via INTRAVENOUS

## 2017-01-30 MED ORDER — PROPOFOL 10 MG/ML IV BOLUS
INTRAVENOUS | Status: DC | PRN
  Administered 2017-01-30: 20 mg via INTRAVENOUS
  Administered 2017-01-30 (×2): 30 mg via INTRAVENOUS
  Administered 2017-01-30: 20 mg via INTRAVENOUS
  Administered 2017-01-30: 30 mg via INTRAVENOUS
  Administered 2017-01-30: 70 mg via INTRAVENOUS

## 2017-01-30 MED ORDER — MIDAZOLAM HCL 2 MG/2ML IJ SOLN
INTRAMUSCULAR | Status: AC
  Filled 2017-01-30: qty 2

## 2017-01-30 MED ORDER — SODIUM CHLORIDE 0.9 % IV SOLN: INTRAVENOUS | Status: DC

## 2017-01-30 MED ORDER — PHENYLEPHRINE HCL 10 MG/ML IJ SOLN
INTRAMUSCULAR | Status: DC | PRN
  Administered 2017-01-30 (×2): 50 ug via INTRAVENOUS
  Administered 2017-01-30: 100 ug via INTRAVENOUS

## 2017-01-30 MED ORDER — FENTANYL CITRATE (PF) 100 MCG/2ML IJ SOLN
INTRAMUSCULAR | Status: AC
  Filled 2017-01-30: qty 2

## 2017-01-30 NOTE — Anesthesia Preprocedure Evaluation (Signed)
Anesthesia Evaluation  Patient identified by MRN, date of birth, ID band Patient awake    Reviewed: Allergy & Precautions, H&P , NPO status , Patient's Chart, lab work & pertinent test results  History of Anesthesia Complications Negative for: history of anesthetic complications  Airway Mallampati: III  TM Distance: <3 FB Neck ROM: limited    Dental  (+) Poor Dentition, Chipped, Missing   Pulmonary neg shortness of breath, COPD, former smoker,           Cardiovascular Exercise Tolerance: Good hypertension, (-) Past MI negative cardio ROS  + Valvular Problems/Murmurs      Neuro/Psych PSYCHIATRIC DISORDERS negative neurological ROS     GI/Hepatic Neg liver ROS, GERD  Controlled and Medicated,  Endo/Other  negative endocrine ROS  Renal/GU negative Renal ROS  negative genitourinary   Musculoskeletal  (+) Arthritis ,   Abdominal   Peds  Hematology negative hematology ROS (+)   Anesthesia Other Findings Past Medical History: 11/21/2015: Acute infarction of spinal cord (HCC) No date: Alcoholism (HCC) No date: Anemia No date: Anxiety No date: Arthritis No date: Cigarette smoker 09/19/2016: Cirrhosis (HCC) No date: Cirrhosis of liver (HCC) No date: COPD (chronic obstructive pulmonary disease) (HCC) No date: Depression No date: GERD (gastroesophageal reflux disease) No date: Heart murmur No date: Hypertension 10/16/2015: Primary osteoarthritis of right hip No date: Shortness of breath dyspnea     Comment:  with exertion 11/21/2015: Spinal cord infarction [facility](HCC) No date: Spine pain, lumbar 05/19/2016: Tobacco abuse No date: UTI (urinary tract infection)  Past Surgical History: 09/11/2016: BREAST BIOPSY; Right     Comment:  Affirm Biopsy- Path pending 1992: CESAREAN SECTION     Comment:  x1 No date: COLONOSCOPY No date: DILATION AND CURETTAGE OF UTERUS No date: ESOPHAGOGASTRODUODENOSCOPY ENDOSCOPY 12/04/2016:  INCISION AND DRAINAGE HIP; Right     Comment:  Procedure: IRRIGATION AND DEBRIDEMENT HIP;  Surgeon:               Kennedy BuckerMenz, Michael, MD;  Location: [facility] ORS;  Service:               Orthopedics;  Laterality: Right; 10/2015: JOINT REPLACEMENT; Right     Comment:  Hip 10/16/2015: TOTAL HIP ARTHROPLASTY; Right     Comment:  Procedure: TOTAL HIP ARTHROPLASTY ANTERIOR APPROACH;                Surgeon: Kennedy BuckerMichael Menz, MD;  Location: [facility] ORS;  Service:              Orthopedics;  Laterality: Right; No date: TUBAL LIGATION  BMI    Body Mass Index:  32.59 kg/m      Reproductive/Obstetrics negative OB ROS                             Anesthesia Physical Anesthesia Plan  ASA: III  Anesthesia Plan: General   Post-op Pain Management:    Induction: Intravenous  PONV Risk Score and Plan:   Airway Management Planned: Natural Airway and Nasal Cannula  Additional Equipment:   Intra-op Plan:   Post-operative Plan:   Informed Consent: I have reviewed the patients History and Physical, chart, labs and discussed the procedure including the risks, benefits and alternatives for the proposed anesthesia with the patient or authorized representative who has indicated his/her understanding and acceptance.   Dental Advisory Given  Plan Discussed with: Anesthesiologist, CRNA and Surgeon  Anesthesia Plan Comments: (Patient consented for risks of anesthesia  including but not limited to:  - adverse reactions to medications - risk of intubation if required - damage to teeth, lips or other oral mucosa - sore throat or hoarseness - Damage to heart, brain, lungs or loss of life  Patient voiced understanding.)        Anesthesia Quick Evaluation

## 2017-01-30 NOTE — Op Note (Addendum)
[facility]astroenter[facility] Patient Name: [name] Procedure Date: 01/30/2017 9:24 AM MRN: [id number] Account #: [id number] Date of Birth: 09-Sep-1960 Admit Type: Outpatient Age: 56 Room: [facility] ENDO ROOM 1 Gender: Female Note Status: Finalized Procedure:            Colonoscopy Indications:          Iron deficiency anemia Providers:            Christena Deem, MD Referring MD:         Courtney Paris. Cliffton Asters, MD (Referring MD) Medicines:            Monitored Anesthesia Care Complications:        No immediate complications. Procedure:            Pre-Anesthesia Assessment:                       - ASA Grade Assessment: III - A patient with severe                        systemic disease.                       After obtaining informed consent, the colonoscope was                        passed under direct vision. Throughout the procedure,                        the patient's blood pressure, pulse, and oxygen                        saturations were monitored continuously. The                        Colonoscope was introduced through the anus and                        advanced to the the cecum, identified by appendiceal                        orifice and ileocecal valve. The colonoscopy was                        performed with moderate difficulty due to significant                        looping. Successful completion of the procedure was                        aided by changing the patient to a supine position,                        changing the patient to a prone position and using                        manual pressure. The patient tolerated the procedure                        well. The quality of the bowel preparation was good. Findings:      Two sessile polyps were found in the  rectum. The polyps were 2 to 3 mm       in size. These polyps were removed with a cold biopsy forceps. Resection       and retrieval were complete.      A few small-mouthed diverticula  were found in the sigmoid colon and       distal descending colon.      Non-bleeding external hemorrhoids were found during perianal exam and       during anoscopy. The hemorrhoids were small.      The exam was otherwise normal throughout the examined colon. Impression:           - Two 2 to 3 mm polyps in the rectum, removed with a                        cold biopsy forceps. Resected and retrieved.                       - Diverticulosis in the sigmoid colon and in the distal                        descending colon.                       - Non-bleeding external hemorrhoids. Recommendation:       - Await pathology results.                       - Telephone GI clinic for pathology results in 1 week.                       - Return to GI clinic in 1 month. Procedure Code(s):    --- Professional ---                       [id number], Colonoscopy, flexible; with biopsy, single or                        multiple Diagnosis Code(s):    --- Professional ---                       K62.1, Rectal polyp                       K64.4, Residual hemorrhoidal skin tags                       D50.9, Iron deficiency anemia, unspecified                       K57.30, Diverticulosis of large intestine without                        perforation or abscess without bleeding CPT copyright 2016 [employer]. All rights reserved. The codes documented in this report are preliminary and upon coder review may  be revised to meet current compliance requirements. Christena DeemMartin U , MD 01/30/2017 10:20:57 AM This report has been signed electronically. Number of Addenda: 0 Note Initiated On: 01/30/2017 9:24 AM Scope Withdrawal Time: 0 hours 8 minutes 34 seconds  Total Procedure Duration: 0 hours 27 minutes 12 seconds       [facility]

## 2017-01-30 NOTE — Op Note (Addendum)
[facility] Patient Name: Danielle SeenJanet Villa Procedure Date: 01/30/2017 9:24 AM MRN: [id number] Account #: [id number] Date of Birth: [birth year] Admit Type: Outpatient Age: [age] Room: [facility] ENDO ROOM 1 Gender: Female Note Status: Finalized Procedure:            Upper GI endoscopy Indications:          Iron deficiency anemia, Cirrhosis rule out esophageal                        varices Providers:            Christena DeemMartin U. Janoah Menna, MD Referring MD:         Courtney ParisElizabeth B. Cliffton AstersWhite, MD (Referring MD) Medicines:            Monitored Anesthesia Care Complications:        No immediate complications. Procedure:            Pre-Anesthesia Assessment:                       - ASA Grade Assessment: III - A patient with severe                        systemic disease.                       After obtaining informed consent, the endoscope was                        passed under direct vision. Throughout the procedure,                        the patient's blood pressure, pulse, and oxygen                        saturations were monitored continuously. The Endoscope                        was introduced through the mouth, and advanced to the                        third part of duodenum. The upper GI endoscopy was                        accomplished without difficulty. The patient tolerated                        the procedure well. Findings:      The Z-line was variable. Biopsies were taken with a cold forceps for       histology.      Normal esophagus otherwise, no evidence of varices.      One non-bleeding cratered gastric ulcer with no stigmata of bleeding was       found at the pylorus. The lesion was 1 mm in largest dimension.      Mild inflammation characterized by congestion (edema) and erythema was       found in the prepyloric region of the stomach. Biopsies were taken with       a cold forceps for histology. Biopsies were taken with a cold forceps       for Helicobacter  pylori testing.      There is no  evidence of portal gastropathy grossly.      The examined duodenum was normal. Impression:           - Z-line variable. Biopsied.                       - Non-bleeding gastric ulcer with no stigmata of                        bleeding.                       - Gastritis. Biopsied.                       - Normal examined duodenum. Recommendation:       - Await pathology results.                       - Await pathology results.                       - Use Protonix (pantoprazole) 40 mg PO BID.                       - Repeat upper endoscopy in 7 weeks to check healing.                       - No aspirin, ibuprofen, naproxen, or other                        non-steroidal anti-inflammatory drugs. Procedure Code(s):    --- Professional ---                       [phone], Esophagogastroduodenoscopy, flexible, transoral;                        with biopsy, single or multiple Diagnosis Code(s):    --- Professional ---                       K22.8, Other specified diseases of esophagus                       K25.9, Gastric ulcer, unspecified as acute or chronic,                        without hemorrhage or perforation                       K29.70, Gastritis, unspecified, without bleeding                       D50.9, Iron deficiency anemia, unspecified                       K74.60, Unspecified cirrhosis of liver CPT copyright 2016 American [employer] Association. All rights reserved. The codes documented in this report are preliminary and upon coder review may  be revised to meet current compliance requirements. Christena Deem, MD 01/30/2017 9:45:07 AM This report has been signed electronically. Number of Addenda: 0 Note Initiated On: 01/30/2017 9:24 AM      [facility]

## 2017-01-30 NOTE — Anesthesia Postprocedure Evaluation (Signed)
Anesthesia Post Note  Patient: Danielle RochesterJanet B [name]  Procedure(s) Performed: Procedure(s) (LRB): COLONOSCOPY WITH PROPOFOL (N/A) ESOPHAGOGASTRODUODENOSCOPY (EGD) WITH PROPOFOL (N/A)  Patient location during evaluation: Endoscopy Anesthesia Type: General Level of consciousness: awake and alert and oriented Pain management: pain level controlled Vital Signs Assessment: post-procedure vital signs reviewed and stable Respiratory status: spontaneous breathing, nonlabored ventilation and respiratory function stable Cardiovascular status: blood pressure returned to baseline and stable Postop Assessment: no signs of nausea or vomiting Anesthetic complications: no     Last Vitals:  Vitals:   01/30/17 1059 01/30/17 1109  BP: 131/74 136/66  Pulse: 84 79  Resp: (!) 22 13  Temp:      Last Pain:  Vitals:   01/30/17 1021  TempSrc: Tympanic                 Soriyah Osberg

## 2017-01-30 NOTE — H&P (Signed)
Outpatient short stay form Pre-procedure 01/30/2017 9:17 AM Danielle DeemMartin U Skulskie MD  Primary Physician: Dr. Doristine MangoElizabeth [name]  Reason for visit:  EGD and colonoscopy  History of present illness:  Patient is a 56 year old female presenting today as above. 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 She does not drink currently. She also has been found to have iron deficiency anemia. On Hemoccult testing she was Hemoccult negative. She states that when she did her prep last night she did see some blood with stool this morning. She is presenting today for further evaluation regards to above. She tolerated her prep well. She denies use of any aspirin or blood thinning agents. He does however on further discussion taken occasional BC powder not recently.    Current Facility-Administered Medications:  .  0.9 %  sodium chloride infusion, , Intravenous, Continuous, Danielle DeemSkulskie, Danielle U, MD .  0.9 %  sodium chloride infusion, , Intravenous, Continuous, Danielle DeemSkulskie, Danielle U, MD .  ampicillin (OMNIPEN) 2 g in sodium chloride 0.9 % 50 mL IVPB, 2 g, Intravenous, Once, Danielle DeemSkulskie, Danielle U, MD, Last Rate: 150 mL/hr at 01/30/17 0900, 2 g at 01/30/17 0900 .  ipratropium-albuterol (DUONEB) 0.5-2.5 (3) MG/3ML nebulizer solution, , , ,  .  ipratropium-albuterol (DUONEB) 0.5-2.5 (3) MG/3ML nebulizer solution, , , ,  .  sodium chloride 0.9 % with ampicillin (OMNIPEN) ADS Med, , , ,   Prescriptions Prior to Admission  Medication Sig Dispense Refill Last Dose  . albuterol (PROVENTIL HFA;VENTOLIN HFA) 108 (90 Base) MCG/ACT inhaler Inhale into the lungs every 6 (six) hours as needed for wheezing or shortness of breath.   Past Month at Unknown time  . Fluticasone-Salmeterol (ADVAIR DISKUS) 250-50 MCG/DOSE AEPB Inhale 1 puff into the lungs 2 (two) times daily.   01/29/2017 at Unknown time  . Ipratropium-Albuterol (COMBIVENT RESPIMAT) 20-100 MCG/ACT AERS respimat Inhale 1 puff into the lungs every 6 (six) hours as  needed for wheezing.   01/30/2017 at Unknown time  . lisinopril (PRINIVIL,ZESTRIL) 5 MG tablet Take 5 mg by mouth daily.  2 01/29/2017 at Unknown time  . nadolol (CORGARD) 20 MG tablet Take 40 mg by mouth daily.   01/29/2017 at Unknown time  . pantoprazole (PROTONIX) 40 MG tablet Take 40 mg by mouth daily.   01/29/2017 at Unknown time  . atorvastatin (LIPITOR) 10 MG tablet Take 10 mg by mouth daily.  2 01/29/2017  . B Complex Vitamins (VITAMIN B COMPLEX) TABS Take 1 tablet by mouth daily.   01/23/2017  . Cholecalciferol (VITAMIN D3) 5000 units CAPS Take 5,000 Units by mouth daily.    01/25/2017  . meloxicam (MOBIC) 15 MG tablet Take 15 mg by mouth daily.   01/23/2017  . oxyCODONE (OXY IR/ROXICODONE) 5 MG immediate release tablet Take 1-2 tablets (5-10 mg total) by mouth every 6 (six) hours as needed for breakthrough pain. (Patient not taking: Reported on 01/30/2017) 30 tablet 0 Completed Course at Unknown time  . sulfamethoxazole-trimethoprim (BACTRIM DS,SEPTRA DS) 800-160 MG tablet Take 1 tablet by mouth every 12 (twelve) hours. (Patient not taking: Reported on 01/30/2017) 20 tablet 0 Completed Course at Unknown time  . tiotropium (SPIRIVA) 18 MCG inhalation capsule Place 18 mcg into inhaler and inhale daily.   Completed Course at Unknown time     Allergies  Allergen Reactions  . Chantix [Varenicline] Other (See Comments)    Pt stated that this Medication caused her tongue to "have a red streak" Per pt so she D/C this medication "streak  on tongue"  . Codeine Itching and Rash    Other Reaction: OTHER REACTION     Past Medical History:  Diagnosis Date  . 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 Alcoholism (HCC)   . Anemia   . Anxiety   . Arthritis   . Cigarette smoker   . Cirrhosis (HCC) 09/19/2016  . Cirrhosis of liver (HCC)   . COPD (chronic obstructive pulmonary disease) (HCC)   . Depression   . GERD (gastroesophageal reflux disease)   . Heart murmur   . Hypertension   .  Primary osteoarthritis of right hip 10/16/2015  . Shortness of breath dyspnea    with exertion  . 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 Spine pain, lumbar   . Tobacco abuse 05/19/2016  . UTI (urinary tract infection)     Review of systems:      Physical Exam    Heart and lungs: Regular rate and rhythm without rub or gallop, lungs are bilaterally clear.    HEENT: Normocephalic atraumatic eyes are anicteric    Other:     Pertinant exam for procedure: Soft nontender nondistended bowel sounds positive normoactive.    Planned proceedures: EGD, colonoscopy and indicated procedures. I have discussed the risks benefits and complications of procedures to include not limited to bleeding, infection, perforation and the risk of sedation and the patient wishes to proceed.    Danielle Deem, MD Gastroenterology 01/30/2017  9:17 AM

## 2017-01-30 NOTE — Anesthesia Procedure Notes (Signed)
Date/Time: 01/30/2017 9:25 AM Performed by: Karoline CaldwellSTARR, Drelyn Pistilli Pre-anesthesia Checklist: Patient identified, Suction available, Emergency Drugs available and Patient being monitored Patient Re-evaluated:Patient Re-evaluated prior to induction Oxygen Delivery Method: Nasal cannula

## 2017-01-30 NOTE — Transfer of Care (Signed)
Immediate Anesthesia Transfer of Care Note  Patient: Danielle RochesterJanet B [name]  Procedure(s) Performed: Procedure(s): COLONOSCOPY WITH PROPOFOL (N/A) ESOPHAGOGASTRODUODENOSCOPY (EGD) WITH PROPOFOL (N/A)  Patient Location: PACU  Anesthesia Type:General  Level of Consciousness: awake, alert  and oriented  Airway & Oxygen Therapy: Patient Spontanous Breathing and Patient connected to nasal cannula oxygen  Post-op Assessment: Report given to RN and Post -op Vital signs reviewed and stable  Post vital signs: Reviewed and stable  Last Vitals:  Vitals:   01/30/17 1019 01/30/17 1021  BP: 107/77 107/77  Pulse: 89 84  Resp: 20 16  Temp:  (!) 36.1 C    Last Pain:  Vitals:   01/30/17 1021  TempSrc: Tympanic         Complications: No apparent anesthesia complications

## 2017-01-30 NOTE — Anesthesia Post-op Follow-up Note (Cosign Needed)
Anesthesia QCDR form completed.        

## 2017-02-02 ENCOUNTER — Encounter: Payer: Self-pay | Admitting: Gastroenterology

## 2017-02-02 ENCOUNTER — Encounter: Payer: Medicare Other | Admitting: Surgery

## 2017-02-02 DIAGNOSIS — L97113 Non-pressure chronic ulcer of right thigh with necrosis of muscle: Secondary | ICD-10-CM | POA: Diagnosis not present

## 2017-02-03 NOTE — Progress Notes (Signed)
KASHIRA, BEHUNIN ([id number]) Visit Report for 02/02/2017 Arrival Information Details Patient Name: [name], [name]. Date of Service: 02/02/2017 3:30 PM Medical Record Number: [id number] Patient Account Number: [id number] Date of Birth/Sex: [birth year] (56 y.o. Female) Treating RN: Ashok Cordia, Debi Primary Care Amelie Caracci: WHITE, Lanora Manis Other Clinician: Referring Tonjia Parillo: WHITE, Lanora Manis Treating Crislyn Willbanks/Extender: Rudene Re in Treatment: 5 Visit Information History Since Last Visit All ordered tests and consults were completed: No Patient Arrived: Ambulatory Added or deleted any medications: No Arrival Time: 15:37 Any new allergies or adverse reactions: No Accompanied By: husband Had a fall or experienced change in No Transfer Assistance: None activities of daily living that may affect Patient Identification Verified: Yes risk of falls: Secondary Verification Process Yes Signs or symptoms of abuse/neglect since last No Completed: visito Patient Requires Transmission-Based No Hospitalized since last visit: No Precautions: Has Dressing in Place as Prescribed: Yes Patient Has Alerts: No Pain Present Now: No Electronic Signature(s) Signed: 02/02/2017 4:27:45 PM By: Alejandro Mulling Entered By: Alejandro Mulling on 02/02/2017 15:38:10 Fielder, [name] ([id number]) -------------------------------------------------------------------------------- Clinic Level of Care Assessment Details Patient Name: Danielle Seen B. Date of Service: 02/02/2017 3:30 PM Medical Record Number: [id number] Patient Account Number: [id number] Date of Birth/Sex: 10-20-1960 (56 y.o. Female) Treating RN: Ashok Cordia, Debi Primary Care Luvern Mcisaac: WHITE, Lanora Manis Other Clinician: Referring Elwyn Klosinski: WHITE, ELIZABETH Treating Reesa Gotschall/Extender: Rudene Re in Treatment: 5 Clinic Level of Care Assessment Items TOOL 4 Quantity Score X - Use when only an EandM is performed on FOLLOW-UP visit 1  0 ASSESSMENTS - Nursing Assessment / Reassessment X - Reassessment of Co-morbidities (includes updates in patient status) 1 10 X - Reassessment of Adherence to Treatment Plan 1 5 ASSESSMENTS - Wound and Skin Assessment / Reassessment X - Simple Wound Assessment / Reassessment - one wound 1 5 []  - Complex Wound Assessment / Reassessment - multiple wounds 0 []  - Dermatologic / Skin Assessment (not related to wound area) 0 ASSESSMENTS - Focused Assessment []  - Circumferential Edema Measurements - multi extremities 0 []  - Nutritional Assessment / Counseling / Intervention 0 []  - Lower Extremity Assessment (monofilament, tuning fork, pulses) 0 []  - Peripheral Arterial Disease Assessment (using hand held doppler) 0 ASSESSMENTS - Ostomy and/or Continence Assessment and Care []  - Incontinence Assessment and Management 0 []  - Ostomy Care Assessment and Management (repouching, etc.) 0 PROCESS - Coordination of Care X - Simple Patient / Family Education for ongoing care 1 15 []  - Complex (extensive) Patient / Family Education for ongoing care 0 []  - Staff obtains Chiropractor, Records, Test Results / Process Orders 0 []  - Staff telephones HHA, Nursing Homes / Clarify orders / etc 0 []  - Routine Transfer to another Facility (non-emergent condition) 0 Laskin, [name] ([id number]) []  - Routine Hospital Admission (non-emergent condition) 0 []  - New Admissions / Manufacturing engineer / Ordering NPWT, Apligraf, etc. 0 []  - Emergency Hospital Admission (emergent condition) 0 X - Simple Discharge Coordination 1 10 []  - Complex (extensive) Discharge Coordination 0 PROCESS - Special Needs []  - Pediatric / Minor Patient Management 0 []  - Isolation Patient Management 0 []  - Hearing / Language / Visual special needs 0 []  - Assessment of Community assistance (transportation, D/C planning, etc.) 0 []  - Additional assistance / Altered mentation 0 []  - Support Surface(s) Assessment (bed, cushion, seat, etc.)  0 INTERVENTIONS - Wound Cleansing / Measurement X - Simple Wound Cleansing - one wound 1 5 []  - Complex Wound Cleansing - multiple wounds 0 X - Wound Imaging (photographs -  any number of wounds) 1 5 []  - Wound Tracing (instead of photographs) 0 X - Simple Wound Measurement - one wound 1 5 []  - Complex Wound Measurement - multiple wounds 0 INTERVENTIONS - Wound Dressings X - Small Wound Dressing one or multiple wounds 1 10 []  - Medium Wound Dressing one or multiple wounds 0 []  - Large Wound Dressing one or multiple wounds 0 X - Application of Medications - topical 1 5 []  - Application of Medications - injection 0 INTERVENTIONS - Miscellaneous []  - External ear exam 0 Licea, [name] ([id number]) []  - Specimen Collection (cultures, biopsies, blood, body fluids, etc.) 0 []  - Specimen(s) / Culture(s) sent or taken to Lab for analysis 0 []  - Patient Transfer (multiple staff / Michiel SitesHoyer Lift / Similar devices) 0 []  - Simple Staple / Suture removal (25 or less) 0 []  - Complex Staple / Suture removal (26 or more) 0 []  - Hypo / Hyperglycemic Management (close monitor of Blood Glucose) 0 []  - Ankle / Brachial Index (ABI) - do not check if billed separately 0 X - Vital Signs 1 5 Has the patient been seen at the hospital within the last three years: Yes Total Score: 80 Level Of Care: New/Established - Level 3 Electronic Signature(s) Signed: 02/02/2017 5:01:35 PM By: Alejandro MullingPinkerton, Debra Entered By: Alejandro MullingPinkerton, Debra on 02/02/2017 17:00:16 Molder, [name] ([id number]) -------------------------------------------------------------------------------- Encounter Discharge Information Details Patient Name: [name], [name] Date of Service: 02/02/2017 3:30 PM Medical Record Number: [id number] Patient Account Number: [id number] Date of Birth/Sex: 28-Aug-1960 (56 y.o. Female) Treating RN: Ashok CordiaPinkerton, Debi Primary Care Tyann Niehaus: WHITE, Lanora ManisELIZABETH Other Clinician: Referring Sayaka Hoeppner: WHITE, Lanora ManisELIZABETH Treating  Cordarrel Stiefel/Extender: Rudene ReBritto, Errol Weeks in Treatment: 5 Encounter Discharge Information Items Discharge Pain Level: 0 Discharge Condition: Stable Ambulatory Status: Ambulatory Discharge Destination: Home Transportation: Private Auto Accompanied By: husband Schedule Follow-up Appointment: Yes Medication Reconciliation completed and provided to Patient/Care No Stanton Cohick: Provided on Clinical Summary of Care: 02/02/2017 Form Type Recipient Paper Patient JW Electronic Signature(s) Signed: 02/02/2017 4:27:45 PM By: Alejandro MullingPinkerton, Debra Previous Signature: 02/02/2017 4:07:41 PM Version By: Gwenlyn PerkingMoore, Shelia Entered By: Alejandro MullingPinkerton, Debra on 02/02/2017 16:11:28 Baquero, [name] ([id number]) -------------------------------------------------------------------------------- Lower Extremity Assessment Details Patient Name: Maione, Jazmyne B. Date of Service: 02/02/2017 3:30 PM Medical Record Number: [id number] Patient Account Number: [id number] Date of Birth/Sex: 28-Aug-1960 (56 y.o. Female) Treating RN: Ashok CordiaPinkerton, Debi Primary Care Joleena Weisenburger: Doristine MangoWHITE, ELIZABETH Other Clinician: Referring Analyn Matusek: WHITE, Lanora ManisELIZABETH Treating Jezebel Pollet/Extender: Rudene ReBritto, Errol Weeks in Treatment: 5 Electronic Signature(s) Signed: 02/02/2017 4:27:45 PM By: Alejandro MullingPinkerton, Debra Entered By: Alejandro MullingPinkerton, Debra on 02/02/2017 15:47:38 Nabozny, [name] ([id number]) -------------------------------------------------------------------------------- Multi Wound Chart Details Patient Name: Spellman, Marchell B. Date of Service: 02/02/2017 3:30 PM Medical Record Number: [id number] Patient Account Number: [id number] Date of Birth/Sex: 28-Aug-1960 (56 y.o. Female) Treating RN: Ashok CordiaPinkerton, Debi Primary Care Donnamaria Shands: WHITE, Lanora ManisELIZABETH Other Clinician: Referring Madalene Mickler: WHITE, Lanora ManisELIZABETH Treating Yifan Auker/Extender: Rudene ReBritto, Errol Weeks in Treatment: 5 Vital Signs Height(in): 63 Pulse(bpm): 57 Weight(lbs): 187.3 Blood Pressure 138/51 (mmHg): Body  Mass Index(BMI): 33 Temperature(F): 97.9 Respiratory Rate 20 (breaths/min): Photos: [1:No Photos] [N/A:N/A] Wound Location: [1:Right Ischium] [N/A:N/A] Wounding Event: [1:Surgical Injury] [N/A:N/A] Primary Etiology: [1:Open Surgical Wound] [N/A:N/A] Comorbid History: [1:Chronic Obstructive Pulmonary Disease (COPD), Hypertension, Cirrhosis , Osteoarthritis] [N/A:N/A] Date Acquired: [1:12/15/2016] [N/A:N/A] Weeks of Treatment: [1:5] [N/A:N/A] Wound Status: [1:Open] [N/A:N/A] Measurements L x W x D 0.3x0.2x0.3 [N/A:N/A] (cm) Area (cm) : [1:0.047] [N/A:N/A] Volume (cm) : [1:0.014] [N/A:N/A] % Reduction in Area: [1:97.00%] [N/A:N/A] % Reduction in Volume: 98.50% [N/A:N/A] Starting Position 1 1 (o'clock): Ending  Position 1 (o'clock): Maximum Distance 1 0.5 (cm): Undermining: [1:Yes] [N/A:N/A] Classification: [1:Partial Thickness] [N/A:N/A] Exudate Amount: [1:Large] [N/A:N/A] Exudate Type: [1:Serosanguineous] [N/A:N/A] Exudate Color: [1:red, brown] [N/A:N/A] Wound Margin: [1:Distinct, outline attached] [N/A:N/A] Granulation Amount: [1:Large (67-100%)] [N/A:N/A] Granulation Quality: Red N/A N/A Necrotic Amount: None Present (0%) N/A N/A Epithelialization: None N/A N/A Periwound Skin Texture: No Abnormalities Noted N/A N/A Periwound Skin No Abnormalities Noted N/A N/A Moisture: Periwound Skin Color: No Abnormalities Noted N/A N/A Temperature: No Abnormality N/A N/A Tenderness on Yes N/A N/A Palpation: Wound Preparation: Ulcer Cleansing: N/A N/A Rinsed/Irrigated with Saline Topical Anesthetic Applied: Other: lidocaine 4% Treatment Notes Electronic Signature(s) Signed: 02/02/2017 4:02:30 PM By: Evlyn Kanner MD, FACS Entered By: Evlyn Kanner on 02/02/2017 16:02:30 Wallick, [name] ([id number]) -------------------------------------------------------------------------------- Multi-Disciplinary Care Plan Details Patient Name: Danielle Seen B. Date of Service: 02/02/2017  3:30 PM Medical Record Number: [id number] Patient Account Number: [id number] Date of Birth/Sex: [birth year] (56 y.o. Female) Treating RN: Ashok Cordia, Debi Primary Care Bradan Congrove: WHITE, Lanora Manis Other Clinician: Referring Aamirah Salmi: WHITE, Lanora Manis Treating Gabbi Whetstone/Extender: Rudene Re in Treatment: 5 Active Inactive ` Abuse / Safety / Falls / Self Care Management Nursing Diagnoses: Potential for falls Goals: Patient will remain injury free related to falls Date Initiated: 12/29/2016 Target Resolution Date: 04/18/2017 Goal Status: Active Interventions: Assess personal safety and home safety (as indicated) on admission and as needed Assess self care needs on admission and as needed Notes: ` Orientation to the Wound Care Program Nursing Diagnoses: Knowledge deficit related to the wound healing center program Goals: Patient/caregiver will verbalize understanding of the Wound Healing Center Program Date Initiated: 12/29/2016 Target Resolution Date: 01/17/2017 Goal Status: Active Interventions: Provide education on orientation to the wound center Notes: ` Pain, Acute or Chronic Nursing Diagnoses: Pain, acute or chronic: actual or potential Tanabe, [name] ([id number]) Potential alteration in comfort, pain Goals: Patient/caregiver will verbalize adequate pain control between visits Date Initiated: 12/29/2016 Target Resolution Date: 03/21/2017 Goal Status: Active Interventions: Assess comfort goal upon admission Complete pain assessment as per visit requirements Notes: ` Wound/Skin Impairment Nursing Diagnoses: Impaired tissue integrity Knowledge deficit related to smoking impact on wound healing Knowledge deficit related to ulceration/compromised skin integrity Goals: Ulcer/skin breakdown will have a volume reduction of 80% by week 12 Date Initiated: 12/29/2016 Target Resolution Date: 04/11/2017 Goal Status: Active Interventions: Assess patient/caregiver ability  to perform ulcer/skin care regimen upon admission and as needed Assess ulceration(s) every visit Notes: Electronic Signature(s) Signed: 02/02/2017 4:27:45 PM By: Alejandro Mulling Entered By: Alejandro Mulling on 02/02/2017 15:54:43 Caton, [name] ([id number]) -------------------------------------------------------------------------------- Pain Assessment Details Patient Name: Danielle Seen B. Date of Service: 02/02/2017 3:30 PM Medical Record Number: [id number] Patient Account Number: [id number] Date of Birth/Sex: 1961/06/19 (56 y.o. Female) Treating RN: Ashok Cordia, Debi Primary Care Deyjah Kindel: WHITE, Lanora Manis Other Clinician: Referring Kyrese Gartman: WHITE, Lanora Manis Treating Lydiana Milley/Extender: Rudene Re in Treatment: 5 Active Problems Location of Pain Severity and Description of Pain Patient Has Paino No Site Locations With Dressing Change: No Pain Management and Medication Current Pain Management: Electronic Signature(s) Signed: 02/02/2017 4:27:45 PM By: Alejandro Mulling Entered By: Alejandro Mulling on 02/02/2017 15:38:16 Weatherspoon, [name] ([id number]) -------------------------------------------------------------------------------- Patient/Caregiver Education Details Patient Name: Danielle Seen B. Date of Service: 02/02/2017 3:30 PM Medical Record Number: [id number] Patient Account Number: [id number] Date of Birth/Gender: 03-21-1961 (56 y.o. Female) Treating RN: Ashok Cordia, Debi Primary Care Physician: WHITE, Lanora Manis Other Clinician: Referring Physician: Doristine Mango Treating Physician/Extender: Rudene Re in Treatment: 5 Education Assessment Education Provided To: Patient Education Topics Provided Wound/Skin Impairment:  Handouts: Other: change dressing as ordered Methods: Demonstration, Explain/Verbal Responses: State content correctly Electronic Signature(s) Signed: 02/02/2017 4:27:45 PM By: Alejandro Mulling Entered By: Alejandro Mulling on  02/02/2017 16:11:37 Purdie, [name] ([id number]) -------------------------------------------------------------------------------- Wound Assessment Details Patient Name: Tomaro, Morayo B. Date of Service: 02/02/2017 3:30 PM Medical Record Number: [id number] Patient Account Number: [id number] Date of Birth/Sex: [birth year] (56 y.o. Female) Treating RN: Ashok Cordia, Debi Primary Care Aggie Douse: WHITE, Lanora Manis Other Clinician: Referring Jet Armbrust: WHITE, Lanora Manis Treating Keaisha Sublette/Extender: Rudene Re in Treatment: 5 Wound Status Wound Number: 1 Primary Open Surgical Wound Etiology: Wound Location: Right Ischium Wound Open Wounding Event: Surgical Injury Status: Date Acquired: 12/15/2016 Comorbid Chronic Obstructive Pulmonary Weeks Of Treatment: 5 History: Disease (COPD), Hypertension, Clustered Wound: No Cirrhosis , Osteoarthritis Photos Photo Uploaded By: Alejandro Mulling on 02/02/2017 16:17:23 Wound Measurements Length: (cm) 0.3 Width: (cm) 0.2 Depth: (cm) 0.3 Area: (cm) 0.047 Volume: (cm) 0.014 % Reduction in Area: 97% % Reduction in Volume: 98.5% Epithelialization: None Tunneling: No Undermining: Yes Starting Position (o'clock): 1 Maximum Distance: (cm) 0.5 Wound Description Classification: Partial Thickness Wound Margin: Distinct, outline attached Exudate Amount: Large Exudate Type: Serosanguineous Exudate Color: red, brown Foul Odor After Cleansing: No Slough/Fibrino No Wound Bed Granulation Amount: Large (67-100%) Coard, Reta B. ([id number]) Granulation Quality: Red Necrotic Amount: None Present (0%) Periwound Skin Texture Texture Color No Abnormalities Noted: No No Abnormalities Noted: No Moisture Temperature / Pain No Abnormalities Noted: No Temperature: No Abnormality Tenderness on Palpation: Yes Wound Preparation Ulcer Cleansing: Rinsed/Irrigated with Saline Topical Anesthetic Applied: Other: lidocaine 4%, Treatment Notes Wound #1  (Right Ischium) 1. Cleansed with: Clean wound with Normal Saline 2. Anesthetic Topical Lidocaine 4% cream to wound bed prior to debridement 3. Peri-wound Care: Skin Prep 4. Dressing Applied: Iodoform packing Gauze 5. Secondary Dressing Applied Dry Gauze [place] Electronic Signature(s) Signed: 02/02/2017 4:27:45 PM By: Alejandro Mulling Entered By: Alejandro Mulling on 02/02/2017 15:46:45 Grade, [name] ([id number]) -------------------------------------------------------------------------------- Vitals Details Patient Name: Danielle Seen B. Date of Service: 02/02/2017 3:30 PM Medical Record Number: [id number] Patient Account Number: [id number] Date of Birth/Sex: 08-13-1960 (56 y.o. Female) Treating RN: Ashok Cordia, Debi Primary Care Jonahtan Manseau: WHITE, Lanora Manis Other Clinician: Referring Lorely Bubb: WHITE, Lanora Manis Treating Bruna Dills/Extender: Rudene Re in Treatment: 5 Vital Signs Time Taken: 15:38 Temperature (F): 97.9 Height (in): 63 Pulse (bpm): 57 Weight (lbs): 187.3 Respiratory Rate (breaths/min): 20 Body Mass Index (BMI): 33.2 Blood Pressure (mmHg): 138/51 Reference Range: 80 - 120 mg / dl Electronic Signature(s) Signed: 02/02/2017 4:27:45 PM By: Alejandro Mulling Entered By: Alejandro Mulling on 02/02/2017 15:42:18

## 2017-02-03 NOTE — Progress Notes (Addendum)
[name] ([id number]) Visit Report for 02/02/2017 Chief Complaint Document Details Patient Name: [name], [name]. Date of Service: 02/02/2017 3:30 PM Medical Record Number: [id number] Patient Account Number: [id number] Date of Birth/Sex: 27-Feb-1961 (56 y.o. Female) Treating RN: Ashok Cordia, Debi Primary Care Provider: WHITE, Lanora Manis Other Clinician: Referring Provider: WHITE, Lanora Manis Treating Provider/Extender: Rudene Re in Treatment: 5 Information Obtained from: Patient Chief Complaint Patient presents to the wound care center with open non-healing surgical wound to the right upper thigh Electronic Signature(s) Signed: 02/02/2017 4:02:38 PM By: Evlyn Kanner MD, FACS Entered By: Evlyn Kanner on 02/02/2017 16:02:37 Towner, Nicki [name] ([id number]) -------------------------------------------------------------------------------- HPI Details Patient Name: Danielle Seen B. Date of Service: 02/02/2017 3:30 PM Medical Record Number: [id number] Patient Account Number: [id number] Date of Birth/Sex: [birth year] (56 y.o. Female) Treating RN: Ashok Cordia, Debi Primary Care Provider: WHITE, Lanora Manis Other Clinician: Referring Provider: WHITE, Lanora Manis Treating Provider/Extender: Rudene Re in Treatment: 5 History of Present Illness Location: right upper thigh Quality: Patient reports experiencing a dull pain to affected area(s). Severity: Patient states wound are getting better Duration: Patient has had the wound for < 6 weeks prior to presenting for treatment Timing: Pain in wound is Intermittent (comes and goes Context: The wound occurred when the patient had incision and drainage of a hematoma of the right thigh Modifying Factors: Other treatment(s) tried include:washing with dilute hydrogen peroxide Associated Signs and Symptoms: Patient reports having increase discharge. HPI Description: 56 year old female who was admitted to the hospital on 524 and kept  overnight for a right hip hematoma which an incision and drainage was done in the OR. inpatient she was treated with IV antibiotics( vancomycin and Ancef) and discharged home on Bactrim DS. the patient has several comorbidities including a former smoker who quit in January of this year and was also a heavy drinker in the past. past medical history significant for COPD, cirrhosis of the liver, arthritis, tobacco abuse and alcohol abuse. 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 The patient has given up cigarettes in January of this year but smokes E cigarette since then. She has given up a heavy alcohol intake for the last 7 years 01/05/17 patient's right lateral hip wound appears to be doing fairly well on evaluation today. She shows no signs or evidence of significant infection and she has been tolerating the dressing changes without complication. She continues to have some depth and undermining in regard to her wound. Today we are going to initiate the SNAP Vac for her. 01/19/17 Patient appears to be doing well with current treatment. She has no evidence of infection currently and is tolerating the SNAP VAC very well. 01/26/17 on evaluation today patient appears to be doing well with the current wound VAC. With that being said the whole has gotten to the point that we are really no longer able to pack this with the foam for the vac. For that reason it appears that we are going to have to do something different going forward today. Fortunately there is no immediate discomfort in the region of her wound and there's no evidence of infection. Electronic Signature(s) Signed: 02/02/2017 4:02:42 PM By: Evlyn Kanner MD, FACS Entered By: Evlyn Kanner on 02/02/2017 16:02:42 Kramar, Nicki [name] ([id number]) -------------------------------------------------------------------------------- Physical Exam Details Patient Name: [name], [name] Date of Service:  02/02/2017 3:30 PM Medical Record Number: [id number] Patient Account Number: [id number] Date of Birth/Sex: 06-Nov-1960 (56 y.o. Female) Treating RN: Ashok Cordia, Debi Primary Care Provider: Doristine Mango Other Clinician:  Referring Provider: WHITE, ELIZABETH Treating Provider/Extender: Rudene Re in Treatment: 5 Constitutional . Pulse regular. Respirations normal and unlabored. Afebrile. . Eyes Nonicteric. Reactive to light. Ears, Nose, Mouth, and Throat Lips, teeth, and gums WNL.Marland Kitchen Moist mucosa without lesions. Neck supple and nontender. No palpable supraclavicular or cervical adenopathy. Normal sized without goiter. Respiratory WNL. No retractions.. Breath sounds WNL, No rubs, rales, rhonchi, or wheeze.. Cardiovascular Heart rhythm and rate regular, no murmur or gallop.. Pedal Pulses WNL. No clubbing, cyanosis or edema. Chest Breasts symmetical and no nipple discharge.. Breast tissue WNL, no masses, lumps, or tenderness.. Lymphatic No adneopathy. No adenopathy. No adenopathy. Musculoskeletal Adexa without tenderness or enlargement.. Digits and nails w/o clubbing, cyanosis, infection, petechiae, ischemia, or inflammatory conditions.. Integumentary (Hair, Skin) No suspicious lesions. No crepitus or fluctuance. No peri-wound warmth or erythema. No masses.Marland Kitchen Psychiatric Judgement and insight Intact.. No evidence of depression, anxiety, or agitation.. Notes the opening is rather small and it probes superiorly to about the 11:00 position. Surrounding there is some induration and some fluctuation but I was not able to enter any pocket with the probe. There is no cellulitis or purulence Electronic Signature(s) Signed: 02/02/2017 4:03:21 PM By: Evlyn Kanner MD, FACS Entered By: Evlyn Kanner on 02/02/2017 16:03:20 Arizmendi, Nicki [name] ([id number]) -------------------------------------------------------------------------------- Physician Orders Details Patient Name: Danielle Seen  B. Date of Service: 02/02/2017 3:30 PM Medical Record Number: [id number] Patient Account Number: [id number] Date of Birth/Sex: Jan 26, 1961 (56 y.o. Female) Treating RN: Ashok Cordia, Debi Primary Care Provider: WHITE, Lanora Manis Other Clinician: Referring Provider: WHITE, Lanora Manis Treating Provider/Extender: Rudene Re in Treatment: 5 Verbal / Phone Orders: Yes Clinician: Pinkerton, Debi Read Back and Verified: Yes Diagnosis Coding Wound Cleansing Wound #1 Right Ischium o Clean wound with Normal Saline. o Clean wound with wound cleanser. Anesthetic Wound #1 Right Ischium o Topical Lidocaine 4% cream applied to wound bed prior to debridement Skin Barriers/Peri-Wound Care Wound #1 Right Ischium o Skin Prep Primary Wound Dressing Wound #1 Right Ischium o Iodoform packing Gauze Secondary Dressing Wound #1 Right Ischium o Dry Gauze o Non-adherent pad - telfa island Dressing Change Frequency Wound #1 Right Ischium o Change dressing every day. Follow-up Appointments Wound #1 Right Ischium o Return Appointment in 1 week. Edema Control Wound #1 Right Ischium o Elevate legs to the level of the heart and pump ankles as often as possible Lovejoy, Krystyna B. ([id number]) Additional Orders / Instructions Wound #1 Right Ischium o Stop Smoking o Increase protein intake. Medications-please add to medication list. Wound #1 Right Ischium o Other: - Vitamin C, Zinc, MVI Electronic Signature(s) Signed: 02/02/2017 4:15:42 PM By: Evlyn Kanner MD, FACS Signed: 02/02/2017 4:27:45 PM By: Alejandro Mulling Entered By: Alejandro Mulling on 02/02/2017 16:07:01 Primo, Nicki [name] ([id number]) -------------------------------------------------------------------------------- Problem List Details Patient Name: Erekson, Helvi B. Date of Service: 02/02/2017 3:30 PM Medical Record Number: [id number] Patient Account Number: [id number] Date of Birth/Sex: Aug 13, 1960 (56 y.o.  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 Date of Service: 02/02/2017 3:30 PM Medical Record Number: [id number] Patient Account Number: [id number] Date of Birth/Sex: June 02, 1961 (56 y.o. Female) Treating RN: Ashok Cordia, Debi Primary Care Provider: WHITE, Lanora Manis Other Clinician: Referring Provider: WHITE, Lanora Manis Treating Provider/Extender: Rudene Re in Treatment: 5 Subjective Chief Complaint Information obtained from Patient Patient presents to the wound care center with open non-healing surgical wound to the right upper thigh History of Present Illness (HPI) The following HPI elements were documented for the patient's wound: Location: right upper thigh Quality: Patient reports experiencing a dull pain to affected area(s). Severity: Patient states wound are getting better Duration: Patient has had the wound for < 6 weeks prior to presenting for treatment Timing: Pain in wound is Intermittent (comes and goes Context: The wound occurred when the patient had incision and drainage of a hematoma of the right thigh Modifying  Factors: Other treatment(s) tried include:washing with dilute hydrogen peroxide Associated Signs and Symptoms: Patient reports having increase discharge. 56 year old female who was admitted to the hospital on 524 and kept overnight for a right hip hematoma which an incision and drainage was done in the OR. inpatient she was treated with IV antibiotics ( vancomycin and Ancef) and discharged home on Bactrim DS. the patient has several comorbidities including a former smoker who quit in January of this year and was also a heavy drinker in the past. past medical history significant for COPD, cirrhosis of the liver, arthritis, tobacco abuse and alcohol abuse. 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 The patient has given up cigarettes in January of this year but smokes E cigarette since then. She has given up a heavy alcohol intake for the last 7 years 01/05/17 patient's right lateral hip wound appears to be doing fairly well on evaluation today. She shows no signs or evidence of significant infection and she has been tolerating the dressing changes without complication. She continues to have some depth and undermining in regard to her wound. Today we are going to initiate the SNAP Vac for her. 01/19/17 Patient appears to be doing well with current treatment. She has no evidence of infection currently and is tolerating the SNAP VAC very well. 01/26/17 on evaluation today patient appears to be doing well with the current wound VAC. With that being said the whole has gotten to the point that we are really no longer able to pack this with the foam for the vac. For that reason it appears that we are going to have to do something different going forward today. Fortunately there is no immediate discomfort in the region of her wound and there's no evidence of infection. Qin, Hien B. ([id number]) Objective Constitutional Pulse regular. Respirations normal and  unlabored. Afebrile. Vitals Time Taken: 3:38 PM, Height: 63 in, Weight: 187.3 lbs, BMI: 33.2, Temperature: 97.9 F, Pulse: 57 bpm, Respiratory Rate: 20 breaths/min, Blood Pressure: 138/51 mmHg. Eyes Nonicteric. Reactive to light. Ears, Nose, Mouth, and Throat Lips, teeth, and gums WNL.Marland Kitchen Moist mucosa without lesions. Neck supple and nontender. No palpable supraclavicular or cervical adenopathy. Normal sized without goiter. Respiratory WNL. No retractions.. Breath sounds WNL, No rubs, rales, rhonchi, or wheeze.. Cardiovascular Heart rhythm and rate regular, no murmur or gallop.. Pedal Pulses WNL. No clubbing, cyanosis or edema. Chest Breasts symmetical and no nipple discharge.. Breast tissue WNL, no masses, lumps, or tenderness.. Lymphatic No adneopathy. No adenopathy. No adenopathy. Musculoskeletal Adexa without tenderness or enlargement.. Digits and nails w/o clubbing, cyanosis, infection,  petechiae, ischemia, or inflammatory conditions.Marland Kitchen. Psychiatric Judgement and insight Intact.. No evidence of depression, anxiety, or agitation.. General Notes: the opening is rather small and it probes superiorly to about the 11:00 position. Surrounding there is some induration and some fluctuation but I was not able to enter any pocket with the probe. There is no cellulitis or purulence Markiewicz, Christinia B. ([id number]) Integumentary (Hair, Skin) No suspicious lesions. No crepitus or fluctuance. No peri-wound warmth or erythema. No masses.. Wound #1 status is Open. Original cause of wound was Surgical Injury. The wound is located on the Right Ischium. The wound measures 0.3cm length x 0.2cm width x 0.3cm depth; 0.047cm^2 area and 0.014cm^3 volume. There is no tunneling noted, however, there is undermining starting at 1:00 and ending at :00 with a maximum distance of 0.5cm. There is a large amount of serosanguineous drainage noted. The wound margin is distinct with the outline attached to the wound  base. There is large (67-100%) red granulation within the wound bed. There is no necrotic tissue within the wound bed. Periwound temperature was noted as No Abnormality. The periwound has tenderness on palpation. 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 Clean wound with wound cleanser. Anesthetic: Wound #1 Right Ischium: Topical Lidocaine 4% cream applied to wound bed prior to debridement Skin Barriers/Peri-Wound Care: Wound #1 Right Ischium: Skin Prep Primary Wound Dressing: Wound #1 Right Ischium: Iodoform packing Gauze Secondary Dressing: Wound #1 Right Ischium: Dry Gauze Non-adherent pad - telfa island Dressing Change Frequency: Wound #1 Right Ischium: Change dressing every day. Virginia RochesterWORKMAN, Aubriee B. ([id number]) Follow-up Appointments: Wound #1 Right Ischium: Return Appointment in 1 week. Edema Control: Wound #1 Right Ischium: Elevate legs to the level of the heart and pump ankles as often as possible Additional Orders / Instructions: Wound #1 Right Ischium: Stop Smoking Increase protein intake. Medications-please add to medication list.: Wound #1 Right Ischium: Other: - Vitamin C, Zinc, MVI at this stage we would lightly pack it with a 1/4 inch iodoform gauze and keep a watch on whether fluid drains out of the indurated area. She will continue all other supportive care Electronic Signature(s) Signed: 02/02/2017 4:19:26 PM By: Evlyn KannerBritto, Jozsef Wescoat MD, FACS Previous Signature: 02/02/2017 4:03:51 PM Version By: Evlyn KannerBritto, Maahi Lannan MD, FACS Entered By: Evlyn KannerBritto, Ellora Varnum on 02/02/2017 16:19:26 Mcnab, Nicki GuadalajaraJANET B.  ([id number]) -------------------------------------------------------------------------------- SuperBill Details Patient Name: Danielle SeenWORKMAN, Willine B. Date of Service: 02/02/2017 Medical Record Number: [id number] Patient Account Number: [id number] Date of Birth/Sex: 1961-04-15 (56 y.o. 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 Disruption of external operation (surgical) wound, not elsewhere classified, initial T81.31XA encounter 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 dependence, cigarettes, with other nicotine-induced disorders Facility Procedures CPT4 Code: [id number] Description: 99213 - WOUND CARE VISIT-LEV 3 EST PT Modifier: Quantity: 1 Physician Procedures CPT4: Description Modifier Quantity Code [id number] 99213 - WC PHYS LEVEL 3 - EST PT 1 ICD-10 Description Diagnosis 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

## 2017-02-04 LAB — SURGICAL PATHOLOGY

## 2017-02-05 ENCOUNTER — Encounter: Payer: Medicare Other | Admitting: Surgery

## 2017-02-05 ENCOUNTER — Other Ambulatory Visit
Admission: RE | Admit: 2017-02-05 | Discharge: 2017-02-05 | Disposition: A | Discharge status: Home / Self Care | Payer: Medicare Other | Source: Ambulatory Visit | Attending: Surgery | Admitting: Surgery

## 2017-02-05 DIAGNOSIS — B998 Other infectious disease: Secondary | ICD-10-CM | POA: Insufficient documentation

## 2017-02-05 DIAGNOSIS — L97113 Non-pressure chronic ulcer of right thigh with necrosis of muscle: Secondary | ICD-10-CM | POA: Diagnosis not present

## 2017-02-07 NOTE — Progress Notes (Signed)
Virginia RochesterWORKMAN, Libra B. ([id number]) Visit Report for 02/05/2017 Chief Complaint Document Details Patient Name: [name] SeenWORKMAN, [name] Date of Service: 02/05/2017 1:30 PM Medical Record Number: [id number] Patient Account Number: [id number] Date of Birth/Sex: [birth year] (56 y.o. Female) Treating RN: Ashok CordiaPinkerton, Debi Primary Care Provider: [name], Lanora ManisELIZABETH Other Clinician: Referring Provider: [name], Lanora ManisELIZABETH Treating Provider/Extender: Rudene ReBritto, Lucile Hillmann Weeks in Treatment: 5 Information Obtained from: Patient Chief Complaint Patient presents to the wound care center with open non-healing surgical wound to the right upper thigh Electronic Signature(s) Signed: 02/05/2017 2:08:24 PM By: Evlyn KannerBritto, Ta Fair MD, FACS Entered By: Evlyn KannerBritto, Caila Cirelli on 02/05/2017 14:08:24 Wank, Nicki GuadalajaraJANET B. ([id number]) -------------------------------------------------------------------------------- HPI Details Patient Name: [name], [name] Date of Service: 02/05/2017 1:30 PM Medical Record Number: [id number] Patient Account Number: [id number] Date of Birth/Sex: [birth year] (56 y.o. Female) Treating RN: Ashok CordiaPinkerton, Debi Primary Care Provider: [name], Lanora ManisELIZABETH Other Clinician: Referring Provider: [name], Lanora ManisELIZABETH Treating Provider/Extender: Rudene ReBritto, Kenyen Candy Weeks in Treatment: 5 History of Present Illness Location: right upper thigh Quality: Patient reports experiencing a dull pain to affected area(s). Severity: Patient states wound are getting better Duration: Patient has had the wound for < 6 weeks prior to presenting for treatment Timing: Pain in wound is Intermittent (comes and goes Context: The wound occurred when the patient had incision and drainage of a hematoma of the right thigh Modifying Factors: Other treatment(s) tried include:washing with dilute hydrogen peroxide Associated Signs and Symptoms: Patient reports having increase discharge. HPI Description: 56 year old female who was admitted to the hospital on 524 and kept  overnight for a right hip hematoma which an incision and drainage was done in the OR. inpatient she was treated with IV antibiotics( vancomycin and Ancef) and discharged home on Bactrim DS. the patient has several comorbidities including a former smoker who quit in January of this year and was also a heavy drinker in the past. past medical history significant for COPD, cirrhosis of the liver, arthritis, tobacco abuse and alcohol abuse. 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 The patient has given up cigarettes in January of this year but smokes E cigarette since then. She has given up a heavy alcohol intake for the last 7 years 01/05/17 patient's right lateral hip wound appears to be doing fairly well on evaluation today. She shows no signs or evidence of significant infection and she has been tolerating the dressing changes without complication. She continues to have some depth and undermining in regard to her wound. Today we are going to initiate the SNAP Vac for her. 01/19/17 Patient appears to be doing well with current treatment. She has no evidence of infection currently and is tolerating the SNAP VAC very well. 01/26/17 on evaluation today patient appears to be doing well with the current wound VAC. With that being said the whole has gotten to the point that we are really no longer able to pack this with the foam for the vac. For that reason it appears that we are going to have to do something different going forward today. Fortunately there is no immediate discomfort in the region of her wound and there's no evidence of infection. Electronic Signature(s) Signed: 02/05/2017 2:08:30 PM By: Evlyn KannerBritto, Ariela Mochizuki MD, FACS Entered By: Evlyn KannerBritto, Terrianne Cavness on 02/05/2017 14:08:30 Buege, Nicki GuadalajaraJANET B. ([id number]) -------------------------------------------------------------------------------- Incision and Drainage Details Patient Name: [name] SeenWORKMAN, [name] B. Date of  Service: 02/05/2017 1:30 PM Medical Record Number: [id number] Patient Account Number: [id number] Date of Birth/Sex: [birth year] (56 y.o. Female) Treating RN: Phillis HaggisPinkerton, Debi Primary Care Provider: [name], Lanora ManisELIZABETH Other  Clinician: Referring Provider: [name], Lanora Manis Treating Provider/Extender: Rudene Re in Treatment: 5 Incision And Drainage Wound #1 Right Ischium Performed for: Performed By: Physician Evlyn Kanner, MD Incision And Drainage Hematoma / Seroma Type: Location: right ischium Pre-procedure Yes - 13:53 Verification/Time Out Taken: Pain Control: Lidocaine 4% Topical Solution Drainage Of: Sero-Sanguineous Instrument: Other Instrument (other): 25g needle, 20ml syringe Bleeding: Minimum Hemostasis Achieved: Pressure Culture Sent: Aspiration Procedural Pain: 0 Post Procedural Pain: 0 Response to Procedure was tolerated well Treatment: Post Procedure Diagnosis Same as Pre-procedure Notes 5 mL of fluid was drained and sent for culture Electronic Signature(s) Signed: 02/05/2017 2:08:16 PM By: Evlyn Kanner MD, FACS Entered By: Evlyn Kanner on 02/05/2017 14:08:16 Roosevelt, Nicki [name] ([id number]) -------------------------------------------------------------------------------- Physical Exam Details Patient Name: [name], [name] Date of Service: 02/05/2017 1:30 PM Medical Record Number: [id number] Patient Account Number: [id number] Date of Birth/Sex: 04/28/1961 (56 y.o. Female) Treating RN: Ashok Cordia, Debi Primary Care Provider: [name], Lanora Manis Other Clinician: Referring Provider: [name], Lanora Manis Treating Provider/Extender: Rudene Re in Treatment: 5 Constitutional . Pulse regular. Respirations normal and unlabored. Afebrile. . Eyes Nonicteric. Reactive to light. Ears, Nose, Mouth, and Throat Lips, teeth, and gums WNL.Marland Kitchen Moist mucosa without lesions. Neck supple and nontender. No palpable supraclavicular or cervical adenopathy. Normal sized without  goiter. Respiratory WNL. No retractions.. Breath sounds WNL, No rubs, rales, rhonchi, or wheeze.. Cardiovascular Heart rhythm and rate regular, no murmur or gallop.. Pedal Pulses WNL. No clubbing, cyanosis or edema. Chest Breasts symmetical and no nipple discharge.. Breast tissue WNL, no masses, lumps, or tenderness.. Lymphatic No adneopathy. No adenopathy. No adenopathy. Musculoskeletal Adexa without tenderness or enlargement.. Digits and nails w/o clubbing, cyanosis, infection, petechiae, ischemia, or inflammatory conditions.. Integumentary (Hair, Skin) No suspicious lesions. No crepitus or fluctuance. No peri-wound warmth or erythema. No masses.Marland Kitchen Psychiatric Judgement and insight Intact.. No evidence of depression, anxiety, or agitation.. Notes the opening is too small and does not probe down into the cavity. She has a fluctuant swelling in the region of the right inferior groin and using a 25-gauge needle and 20 mL syringe I was able to aspirate about 5 mL of hemostasis fluid. I have sent this for culture Electronic Signature(s) Signed: 02/05/2017 2:09:12 PM By: Evlyn Kanner MD, FACS Entered By: Evlyn Kanner on 02/05/2017 14:09:11 Carbo, Nicki [name] ([id number]) -------------------------------------------------------------------------------- Physician Orders Details Patient Name: [name] Seen B. Date of Service: 02/05/2017 1:30 PM Medical Record Number: [id number] Patient Account Number: [id number] Date of Birth/Sex: July 19, 1960 (56 y.o. Female) Treating RN: Ashok Cordia, Debi Primary Care Provider: [name], Lanora Manis Other Clinician: Referring Provider: [name], Lanora Manis Treating Provider/Extender: Rudene Re in Treatment: 5 Verbal / Phone Orders: Yes Clinician: Pinkerton, Debi Read Back and Verified: Yes Diagnosis Coding Wound Cleansing Wound #1 Right Ischium o Clean wound with Normal Saline. o Clean wound with wound cleanser. Anesthetic Wound #1 Right  Ischium o Topical Lidocaine 4% cream applied to wound bed prior to debridement Skin Barriers/Peri-Wound Care Wound #1 Right Ischium o Skin Prep Secondary Dressing Wound #1 Right Ischium o Dry Gauze o Other - tape Dressing Change Frequency Wound #1 Right Ischium o Change dressing every day. Follow-up Appointments Wound #1 Right Ischium o Return Appointment in 1 week. o Other: - next appt Monday Edema Control Wound #1 Right Ischium o Elevate legs to the level of the heart and pump ankles as often as possible Additional Orders / Instructions Wound #1 Right Ischium o Stop Smoking o Increase protein intake. Dehne, Auden B. ([id number]) Medications-please add to medication list. Wound #1  Right Ischium o Other: - Vitamin C, Zinc, MVI Electronic Signature(s) Signed: 02/05/2017 3:53:48 PM By: Evlyn KannerBritto, Holly Pring MD, FACS Signed: 02/05/2017 4:39:46 PM By: Alejandro MullingPinkerton, Debra Entered By: Alejandro MullingPinkerton, Debra on 02/05/2017 14:02:56 Pantaleo, Nicki GuadalajaraJANET B. ([id number]) -------------------------------------------------------------------------------- Problem List Details Patient Name: [name], Deyja B. Date of Service: 02/05/2017 1:30 PM Medical Record Number: [id number] Patient Account Number: [id number] Date of Birth/Sex: 08/07/1960 (56 y.o. 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 ([id number]) -------------------------------------------------------------------------------- Progress Note Details Patient Name: Mikhail, [name] B. Date of Service: 02/05/2017 1:30 PM Medical Record Number: [id number] Patient Account Number: [id number] Date of Birth/Sex: 07/04/1961 (56 y.o. Female) Treating RN: Ashok CordiaPinkerton, Debi Primary Care Provider: [name], Lanora ManisELIZABETH Other Clinician: Referring Provider: [name], Lanora ManisELIZABETH Treating Provider/Extender: Rudene ReBritto, Jenna Routzahn Weeks in Treatment: 5 Subjective Chief Complaint Information obtained from Patient Patient presents to the wound care center with open non-healing surgical wound to the right upper thigh History of Present Illness (HPI) The following HPI elements were documented for the patient's wound: Location: right upper thigh Quality: Patient reports experiencing a dull pain to affected area(s). Severity: Patient states wound are getting better Duration: Patient has had the wound for < 6 weeks prior to presenting for treatment Timing: Pain in wound is Intermittent (comes and goes Context: The wound occurred when the patient had incision and drainage of a hematoma of the right thigh Modifying Factors: Other treatment(s) tried include:washing with dilute hydrogen peroxide Associated Signs and Symptoms: Patient reports having increase discharge. 56 year old female who was admitted to the hospital on 524 and kept overnight for a right hip hematoma which an incision and drainage was done in the OR. inpatient she was treated with IV antibiotics ( vancomycin and Ancef) and discharged home on Bactrim DS. the patient has several comorbidities including a former smoker who quit in January of this year and was also a heavy drinker in the past. past medical history significant for COPD, cirrhosis of the liver, arthritis, tobacco abuse and alcohol  abuse. 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 The patient has given up cigarettes in January of this year but smokes E cigarette since then. She has given up a heavy alcohol intake for the last 7 years 01/05/17 patient's right lateral hip wound appears to be doing fairly well on evaluation today. She shows no signs or evidence of significant infection and she has been tolerating the dressing changes without complication. She continues to have some depth and undermining in regard to her wound. Today we are going to initiate the SNAP Vac for her. 01/19/17 Patient appears to be doing well with current treatment. She has no evidence of infection currently and is tolerating the SNAP VAC very well. 01/26/17 on evaluation today patient appears to be doing well with the current wound VAC. With that being said the whole has gotten to the point that we are really no longer able to pack this with the foam for the vac. For that reason it appears that  we are going to have to do something different going forward today. Fortunately there is no immediate discomfort in the region of her wound and there's no evidence of infection. Makepeace, Marinda B. ([id number]) Objective Constitutional Pulse regular. Respirations normal and unlabored. Afebrile. Vitals Time Taken: 1:33 PM, Height: 63 in, Weight: 187.3 lbs, BMI: 33.2, Temperature: 97.7 F, Pulse: 66 bpm, Respiratory Rate: 20 breaths/min, Blood Pressure: 172/73 mmHg. General Notes: Made Dr. Meyer Russel aware about BP. Eyes Nonicteric. Reactive to light. Ears, Nose, Mouth, and Throat Lips, teeth, and gums WNL.Marland Kitchen Moist mucosa without lesions. Neck supple and nontender. No palpable supraclavicular or cervical adenopathy. Normal sized without goiter. Respiratory WNL. No retractions.. Breath sounds WNL, No rubs, rales, rhonchi, or wheeze.. Cardiovascular Heart rhythm and rate regular, no murmur or gallop.. Pedal Pulses WNL.  No clubbing, cyanosis or edema. Chest Breasts symmetical and no nipple discharge.. Breast tissue WNL, no masses, lumps, or tenderness.. Lymphatic No adneopathy. No adenopathy. No adenopathy. Musculoskeletal Adexa without tenderness or enlargement.. Digits and nails w/o clubbing, cyanosis, infection, petechiae, ischemia, or inflammatory conditions.Marland Kitchen Psychiatric Judgement and insight Intact.. No evidence of depression, anxiety, or agitation.. General Notes: the opening is too small and does not probe down into the cavity. She has a fluctuant swelling in the region of the right inferior groin and using a 25-gauge needle and 20 mL syringe I was able Morea, Brady B. ([id number]) to aspirate about 5 mL of hemostasis fluid. I have sent this for culture Integumentary (Hair, Skin) No suspicious lesions. No crepitus or fluctuance. No peri-wound warmth or erythema. No masses.. Wound #1 status is Open. Original cause of wound was Surgical Injury. The wound is located on the Right Ischium. The wound measures 0.3cm length x 0.2cm width x 0.8cm depth; 0.047cm^2 area and 0.038cm^3 volume. There is no tunneling or undermining noted. There is a large amount of serosanguineous drainage noted. The wound margin is distinct with the outline attached to the wound base. There is large (67-100%) red granulation within the wound bed. There is no necrotic tissue within the wound bed. Periwound temperature was noted as No Abnormality. The periwound has tenderness on palpation. 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 Hematoma / Seroma incision and  drainage was provided by Evlyn Kanner, MD. The skin was cleansed and prepped with anti-septic followed by pain control using Lidocaine 4% Topical Solution. An incision was made in the right ischium with the following instrument(s): Other. There was an immediate release of Sero- Sanguineous fluid. A Minimum amount of bleeding was controlled with Pressure. A time out was conducted at 13:53, prior to the start of the procedure. Aspiration culture was sent. The procedure was tolerated well with a pain level of 0 throughout and a pain level of 0 following the procedure. Post procedure Diagnosis Wound #1: Same as Pre-Procedure General Notes: 5 mL of fluid was drained and sent for culture. Plan Wound Cleansing: YAEKO, FAZEKAS B. ([id number]) Wound #1 Right Ischium: Clean wound with Normal Saline. Clean wound with wound cleanser. Anesthetic: Wound #1 Right Ischium: Topical Lidocaine 4% cream applied to wound bed prior to debridement Skin Barriers/Peri-Wound Care: Wound #1 Right Ischium: Skin Prep Secondary Dressing: Wound #1 Right Ischium: Dry Gauze Other -  tape Dressing Change Frequency: Wound #1 Right Ischium: Change dressing every day. Follow-up Appointments: Wound #1 Right Ischium: Return Appointment in 1 week. Other: - next appt Monday Edema Control: Wound #1 Right Ischium: Elevate legs to the level of the heart and pump ankles as often as possible Additional Orders / Instructions: Wound #1 Right Ischium: Stop Smoking Increase protein intake. Medications-please add to medication list.: Wound #1 Right Ischium: Other: - Vitamin C, Zinc, MVI the external opening has completely closed down and the sinus tract leading onto the cavity has been completely blocked. After aspiration today I put a pressure dressing in this area and we will see her back on Monday. Will await the fluid culture. Electronic Signature(s) Signed: 02/05/2017 2:10:16 PM By: Evlyn Kanner MD, FACS Entered By:  Evlyn Kanner on 02/05/2017 14:10:16 Niehoff, Nicki [name] ([id number]) -------------------------------------------------------------------------------- SuperBill Details Patient Name: [name] Seen B. Date of Service: 02/05/2017 Medical Record Number: [id number] Patient Account Number: [id number] Date of Birth/Sex: [birth year] (56 y.o. 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 Disruption of external operation (surgical) wound, not elsewhere classified, initial T81.31XA encounter 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 dependence, cigarettes, with other nicotine-induced disorders Facility Procedures CPT4: Description Modifier Quantity Code 42595638 10140 - [facility] HEMATOMA SEROMA 1 ICD-10 Description Diagnosis 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 T81.31XA Disruption of external operation (surgical) wound, not  elsewhere classified, initial encounter 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 dependence, cigarettes, with other nicotine-induced disorders Physician Procedures CPT4: Description Modifier Quantity Code [id number] 10140 - WC PHYS TX OF IandD HEMATOMA SEROMA 1 ICD-10 Description Diagnosis 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

## 2017-02-07 NOTE — Progress Notes (Signed)
[name], [name] ([id number]) Visit Report for 02/05/2017 Arrival Information Details Patient Name: [name], [name] Date of Service: 02/05/2017 1:30 PM Medical Record Number: [id number] Patient Account Number: [id number] Date of Birth/Sex: 18-Mar-1961 (56 y.o. Female) Treating RN: [name], [name] Primary Care [name]: [name], [name] Other Clinician: Referring [name]: [name], [name] Treating [name]/Extender: [name], [name] in Treatment: 5 Visit Information History Since Last Visit All ordered tests and consults were completed: No Patient Arrived: Ambulatory Added or deleted any medications: No Arrival Time: 13:30 Any new allergies or adverse reactions: No Accompanied By: husband Had a fall or experienced change in No Transfer Assistance: None activities of daily living that may affect Patient Identification Verified: Yes risk of falls: Secondary Verification Process Yes Signs or symptoms of abuse/neglect since last No Completed: visito Patient Requires Transmission-Based No Hospitalized since last visit: No Precautions: Has Dressing in Place as Prescribed: Yes Patient Has Alerts: No Pain Present Now: No Electronic Signature(s) Signed: 02/05/2017 4:39:46 PM By: [name], [name] Entered By: [name], [name] on 02/05/2017 13:33:10 [name], [name] ([id number]) -------------------------------------------------------------------------------- Encounter Discharge Information Details Patient Name: [name], [name] Date of Service: 02/05/2017 1:30 PM Medical Record Number: [id number] Patient Account Number: [id number] Date of Birth/Sex: 18-Mar-1961 (56 y.o. Female) Treating RN: [name], [name] Primary Care Farrell Broerman: [name], [name] Other Clinician: Referring Loreena Valeri: [name], [name] Treating [name]/Extender: [name], [name] in Treatment: 5 Encounter Discharge Information Items Discharge Pain Level: 0 Discharge Condition: Stable Ambulatory Status:  Ambulatory Discharge Destination: Home Transportation: Private Auto Accompanied By: husband Schedule Follow-up Appointment: Yes Medication Reconciliation completed and provided to Patient/Care No [name]: Provided on Clinical Summary of Care: 02/05/2017 Form Type Recipient Paper Patient JW Electronic Signature(s) Signed: 02/05/2017 2:05:50 PM By: [name], [name] Entered By: [name], [name] on 02/05/2017 14:05:50 [name], [name] ([id number]) -------------------------------------------------------------------------------- Lower Extremity Assessment Details Patient Name: [name], [name] Date of Service: 02/05/2017 1:30 PM Medical Record Number: [id number] Patient Account Number: [id number] Date of Birth/Sex: 18-Mar-1961 (56 y.o. Female) Treating RN: [name], [name] Primary Care Somalia Segler: Danielle MangoWHITE, [name] Other Clinician: Referring Navayah Sok: [name], [name] Treating [name]/Extender: [name], [name] in Treatment: 5 Electronic Signature(s) Signed: 02/05/2017 4:39:46 PM By: [name], [name] Entered By: [name], [name] on 02/05/2017 13:40:47 [name], [name] ([id number]) -------------------------------------------------------------------------------- Multi Wound Chart Details Patient Name: [name], [name] Date of Service: 02/05/2017 1:30 PM Medical Record Number: [id number] Patient Account Number: [id number] Date of Birth/Sex: 18-Mar-1961 (56 y.o. Female) Treating RN: [name], [name] Primary Care Nyiesha Beever: [name], [name] Other Clinician: Referring [name]: [name], [name] Treating [name]/Extender: [name], [name] in Treatment: 5 Vital Signs Height(in): 63 Pulse(bpm): 66 Weight(lbs): 187.3 Blood Pressure 172/73 (mmHg): Body Mass Index(BMI): 33 Temperature(F): 97.7 Respiratory Rate 20 (breaths/min): Photos: [1:No Photos] [N/A:N/A] Wound Location: [1:Right Ischium] [N/A:N/A] Wounding Event: [1:Surgical Injury] [N/A:N/A] Primary Etiology: [1:Open Surgical  Wound] [N/A:N/A] Comorbid History: [1:Chronic Obstructive Pulmonary Disease (COPD), Hypertension, Cirrhosis , Osteoarthritis] [N/A:N/A] Date Acquired: [1:12/15/2016] [N/A:N/A] [name] of Treatment: [1:5] [N/A:N/A] Wound Status: [1:Open] [N/A:N/A] Measurements L x W x D 0.3x0.2x0.8 [N/A:N/A] (cm) Area (cm) : [1:0.047] [N/A:N/A] Volume (cm) : [1:0.038] [N/A:N/A] % Reduction in Area: [1:97.00%] [N/A:N/A] % Reduction in Volume: 96.00% [N/A:N/A] Classification: [1:Partial Thickness] [N/A:N/A] Exudate Amount: [1:Large] [N/A:N/A] Exudate Type: [1:Serosanguineous] [N/A:N/A] Exudate Color: [1:red, brown] [N/A:N/A] Wound Margin: [1:Distinct, outline attached] [N/A:N/A] Granulation Amount: [1:Large (67-100%)] [N/A:N/A] Granulation Quality: [1:Red] [N/A:N/A] Necrotic Amount: [1:None Present (0%)] [N/A:N/A] Epithelialization: [1:None] [N/A:N/A] Periwound Skin Texture: No Abnormalities Noted [N/A:N/A] Periwound Skin [1:No Abnormalities Noted] [N/A:N/A] Moisture: [name], [name] ([id number]) Periwound Skin Color: No Abnormalities Noted N/A N/A Temperature: No  Abnormality N/A N/A Tenderness on Yes N/A N/A Palpation: Wound Preparation: Ulcer Cleansing: N/A N/A Rinsed/Irrigated with Saline Topical Anesthetic Applied: Other: lidocaine 4% Procedures Performed: Incision and Drainage N/A N/A Treatment Notes Electronic Signature(s) Signed: 02/05/2017 2:07:43 PM By: Evlyn Kanner MD, FACS Entered By: Evlyn Kanner on 02/05/2017 14:07:43 [name], [name] ([id number]) -------------------------------------------------------------------------------- Multi-Disciplinary Care Plan Details Patient Name: Danielle [name] Date of Service: 02/05/2017 1:30 PM Medical Record Number: [id number] Patient Account Number: [id number] Date of Birth/Sex: Feb 12, 1961 (56 y.o. Female) Treating RN: Danielle Cordia, [name] Primary Care Terasa Orsini: [name], Danielle Manis Other Clinician: Referring Huey Scalia: [name], Danielle Manis Treating  Willetta York/Extender: Danielle Re in Treatment: 5 Active Inactive ` Abuse / Safety / Falls / Self Care Management Nursing Diagnoses: Potential for falls Goals: Patient will remain injury free related to falls Date Initiated: 12/29/2016 Target Resolution Date: 04/18/2017 Goal Status: Active Interventions: Assess personal safety and home safety (as indicated) on admission and as needed Assess self care needs on admission and as needed Notes: ` Orientation to the Wound Care Program Nursing Diagnoses: Knowledge deficit related to the wound healing center program Goals: Patient/caregiver will verbalize understanding of the Wound Healing Center Program Date Initiated: 12/29/2016 Target Resolution Date: 01/17/2017 Goal Status: Active Interventions: Provide education on orientation to the wound center Notes: ` Pain, Acute or Chronic Nursing Diagnoses: Pain, acute or chronic: actual or potential Tidwell, Saree B. ([id number]) Potential alteration in comfort, pain Goals: Patient/caregiver will verbalize adequate pain control between visits Date Initiated: 12/29/2016 Target Resolution Date: 03/21/2017 Goal Status: Active Interventions: Assess comfort goal upon admission Complete pain assessment as per visit requirements Notes: ` Wound/Skin Impairment Nursing Diagnoses: Impaired tissue integrity Knowledge deficit related to smoking impact on wound healing Knowledge deficit related to ulceration/compromised skin integrity Goals: Ulcer/skin breakdown will have a volume reduction of 80% by week 12 Date Initiated: 12/29/2016 Target Resolution Date: 04/11/2017 Goal Status: Active Interventions: Assess patient/caregiver ability to perform ulcer/skin care regimen upon admission and as needed Assess ulceration(s) every visit Notes: Electronic Signature(s) Signed: 02/05/2017 4:39:46 PM By: Danielle Mulling Entered By: Danielle Mulling on 02/05/2017 13:46:25 Zimbelman, [name]  ([id number]) -------------------------------------------------------------------------------- Pain Assessment Details Patient Name: Danielle [name] Date of Service: 02/05/2017 1:30 PM Medical Record Number: [id number] Patient Account Number: [id number] Date of Birth/Sex: 12-Jun-1961 (56 y.o. Female) Treating RN: Danielle Cordia, [name] Primary Care Yanelly Cantrelle: [name], Danielle Manis Other Clinician: Referring Hayleen Clinkscales: [name], Danielle Manis Treating Susann Lawhorne/Extender: Danielle Re in Treatment: 5 Active Problems Location of Pain Severity and Description of Pain Patient Has Paino No Site Locations With Dressing Change: No Pain Management and Medication Current Pain Management: Electronic Signature(s) Signed: 02/05/2017 4:39:46 PM By: Danielle Mulling Entered By: Danielle Mulling on 02/05/2017 13:33:16 Bath, [name] ([id number]) -------------------------------------------------------------------------------- Patient/Caregiver Education Details Patient Name: Danielle [name] Date of Service: 02/05/2017 1:30 PM Medical Record Number: [id number] Patient Account Number: [id number] Date of Birth/Gender: 1960-10-11 (56 y.o. Female) Treating RN: Danielle Cordia, [name] Primary Care Physician: [name], Danielle Manis Other Clinician: Referring Physician: [name], Danielle Manis Treating Physician/Extender: Danielle Re in Treatment: 5 Education Assessment Education Provided To: Patient Education Topics Provided Wound/Skin Impairment: Handouts: Other: change dressing as ordered Methods: Demonstration, Explain/Verbal Responses: State content correctly Electronic Signature(s) Signed: 02/05/2017 4:39:46 PM By: Danielle Mulling Entered By: Danielle Mulling on 02/05/2017 13:42:22 Bryden, [name] ([id number]) -------------------------------------------------------------------------------- Wound Assessment Details Patient Name: Bores, Ryonna B. Date of Service: 02/05/2017 1:30 PM Medical Record Number:  [id number] Patient Account Number: [id number] Date of Birth/Sex: 01-May-1961 (56 y.o. Female) Treating RN: Phillis Haggis Primary Care Gayle Martinez: [name], Danielle Manis Other  Clinician: Referring Lamoyne Palencia: [name], [name] Treating Remmy Crass/Extender: [name], [name] in Treatment: 5 Wound Status Wound Number: 1 Primary Open Surgical Wound Etiology: Wound Location: Right Ischium Wound Open Wounding Event: Surgical Injury Status: Date Acquired: 12/15/2016 Comorbid Chronic Obstructive Pulmonary [name] Of Treatment: 5 History: Disease (COPD), Hypertension, Clustered Wound: No Cirrhosis , Osteoarthritis Photos Photo Uploaded By: [name], [name] on 02/05/2017 15:03:15 Wound Measurements Length: (cm) 0.3 Width: (cm) 0.2 Depth: (cm) 0.8 Area: (cm) 0.047 Volume: (cm) 0.038 % Reduction in Area: 97% % Reduction in Volume: 96% Epithelialization: None Tunneling: No Undermining: No Wound Description Classification: Partial Thickness Foul Odor Aft Wound Margin: Distinct, outline attached Slough/Fibrin Exudate Amount: Large Exudate Type: Serosanguineous Exudate Color: red, brown er Cleansing: No o No Wound Bed Granulation Amount: Large (67-100%) Granulation Quality: Red Necrotic Amount: None Present (0%) Knight, Arien B. ([id number]) Periwound Skin Texture Texture Color No Abnormalities Noted: No No Abnormalities Noted: No Moisture Temperature / Pain No Abnormalities Noted: No Temperature: No Abnormality Tenderness on Palpation: Yes Wound Preparation Ulcer Cleansing: Rinsed/Irrigated with Saline Topical Anesthetic Applied: Other: lidocaine 4%, Electronic Signature(s) Signed: 02/05/2017 4:39:46 PM By: [name], [name] Entered By: [name], [name] on 02/05/2017 13:40:25 Evrard, [name] ([id number]) -------------------------------------------------------------------------------- Vitals Details Patient Name: [name], [name] Date of Service: 02/05/2017 1:30 PM Medical  Record Number: [id number] Patient Account Number: [id number] Date of Birth/Sex: 01-28-1961 (56 y.o. Female) Treating RN: [name], [name] Primary Care Latroya Ng: [name], [name] Other Clinician: Referring Berkeley Veldman: [name], [name] Treating Khayla Koppenhaver/Extender: [name], [name] in Treatment: 5 Vital Signs Time Taken: 13:33 Temperature (F): 97.7 Height (in): 63 Pulse (bpm): 66 Weight (lbs): 187.3 Respiratory Rate (breaths/min): 20 Body Mass Index (BMI): 33.2 Blood Pressure (mmHg): 172/73 Reference Range: 80 - 120 mg / dl Notes Made Dr. Meyer RusselBritto aware about BP. Electronic Signature(s) Signed: 02/05/2017 4:39:46 PM By: [name], [name] Entered By: [name], [name] on 02/05/2017 13:33:56

## 2017-02-08 LAB — AEROBIC CULTURE  (SUPERFICIAL SPECIMEN)

## 2017-02-08 LAB — AEROBIC CULTURE W GRAM STAIN (SUPERFICIAL SPECIMEN): Culture: NO GROWTH

## 2017-02-12 ENCOUNTER — Encounter: Payer: Medicare Other | Attending: Surgery | Admitting: Surgery

## 2017-02-12 DIAGNOSIS — K7460 Unspecified cirrhosis of liver: Secondary | ICD-10-CM | POA: Insufficient documentation

## 2017-02-12 DIAGNOSIS — F17290 Nicotine dependence, other tobacco product, uncomplicated: Secondary | ICD-10-CM | POA: Diagnosis not present

## 2017-02-12 DIAGNOSIS — Y839 Surgical procedure, unspecified as the cause of abnormal reaction of the patient, or of later complication, without mention of misadventure at the time of the procedure: Secondary | ICD-10-CM | POA: Insufficient documentation

## 2017-02-12 DIAGNOSIS — M1990 Unspecified osteoarthritis, unspecified site: Secondary | ICD-10-CM | POA: Diagnosis not present

## 2017-02-12 DIAGNOSIS — L02214 Cutaneous abscess of groin: Secondary | ICD-10-CM | POA: Insufficient documentation

## 2017-02-12 DIAGNOSIS — F17218 Nicotine dependence, cigarettes, with other nicotine-induced disorders: Secondary | ICD-10-CM | POA: Insufficient documentation

## 2017-02-12 DIAGNOSIS — F1010 Alcohol abuse, uncomplicated: Secondary | ICD-10-CM | POA: Diagnosis not present

## 2017-02-12 DIAGNOSIS — T8131XA Disruption of external operation (surgical) wound, not elsewhere classified, initial encounter: Secondary | ICD-10-CM | POA: Insufficient documentation

## 2017-02-12 DIAGNOSIS — I10 Essential (primary) hypertension: Secondary | ICD-10-CM | POA: Insufficient documentation

## 2017-02-12 DIAGNOSIS — J449 Chronic obstructive pulmonary disease, unspecified: Secondary | ICD-10-CM | POA: Diagnosis not present

## 2017-02-12 DIAGNOSIS — L97113 Non-pressure chronic ulcer of right thigh with necrosis of muscle: Secondary | ICD-10-CM | POA: Insufficient documentation

## 2017-02-13 NOTE — Progress Notes (Addendum)
[name], [name] ([id number]) Visit Report for 02/12/2017 Chief Complaint Document Details Patient Name: [name], [name] Date of Service: 02/12/2017 12:30 PM Medical Record Number: [id number] Patient Account Number: [id number] Date of Birth/Sex: Sep 26, 1960 (56 y.o. Female) Treating RN: Ashok CordiaPinkerton, Debi Primary Care Provider: WHITE, Lanora ManisELIZABETH Other Clinician: Referring Provider: WHITE, Lanora ManisELIZABETH Treating Provider/Extender: Rudene ReBritto, Hajer Dwyer Weeks in Treatment: 6 Information Obtained from: Patient Chief Complaint Patient presents to the wound care center with open non-healing surgical wound to the right upper thigh Electronic Signature(s) Signed: 02/12/2017 1:02:41 PM By: Evlyn KannerBritto, Obed Samek MD, FACS Entered By: Evlyn KannerBritto, Delana Manganello on 02/12/2017 13:02:41 Gaba, Nicki GuadalajaraJANET B. ([id number]) -------------------------------------------------------------------------------- HPI Details Patient Name: Danielle SeenWORKMAN, [name] Date of Service: 02/12/2017 12:30 PM Medical Record Number: [id number] Patient Account Number: [id number] Date of Birth/Sex: Sep 26, 1960 (56 y.o. Female) Treating RN: Ashok CordiaPinkerton, Debi Primary Care Provider: WHITE, Lanora ManisELIZABETH Other Clinician: Referring Provider: WHITE, Lanora ManisELIZABETH Treating Provider/Extender: Rudene ReBritto, Delisa Finck Weeks in Treatment: 6 History of Present Illness Location: right upper thigh Quality: Patient reports experiencing a dull pain to affected area(s). Severity: Patient states wound are getting better Duration: Patient has had the wound for < 6 weeks prior to presenting for treatment Timing: Pain in wound is Intermittent (comes and goes Context: The wound occurred when the patient had incision and drainage of a hematoma of the right thigh Modifying Factors: Other treatment(s) tried include:washing with dilute hydrogen peroxide Associated Signs and Symptoms: Patient reports having increase discharge. HPI Description: 56 year old female who was admitted to the hospital on 524 and kept overnight  for a right hip hematoma which an incision and drainage was done in the OR. inpatient she was treated with IV antibiotics( vancomycin and Ancef) and discharged home on Bactrim DS. the patient has several comorbidities including a former smoker who quit in January of this year and was also a heavy drinker in the past. past medical history significant for COPD, cirrhosis of the liver, arthritis, tobacco abuse and alcohol abuse. 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 The patient has given up cigarettes in January of this year but smokes E cigarette since then. She has given up a heavy alcohol intake for the last 7 years 01/05/17 patient's right lateral hip wound appears to be doing fairly well on evaluation today. She shows no signs or evidence of significant infection and she has been tolerating the dressing changes without complication. She continues to have some depth and undermining in regard to her wound. Today we are going to initiate the SNAP Vac for her. 01/19/17 Patient appears to be doing well with current treatment. She has no evidence of infection currently and is tolerating the SNAP VAC very well. 01/26/17 on evaluation today patient appears to be doing well with the current wound VAC. With that being said the whole has gotten to the point that we are really no longer able to pack this with the foam for the vac. For that reason it appears that we are going to have to do something different going forward today. Fortunately there is no immediate discomfort in the region of her wound and there's no evidence of infection. 02/22/2017 -- fluid from right groin collection showed no growth in 2 days Electronic Signature(s) Signed: 02/12/2017 1:02:51 PM By: Evlyn KannerBritto, Loranda Mastel MD, FACS Previous Signature: 02/12/2017 1:02:01 PM Version By: Evlyn KannerBritto, Maryln Eastham MD, FACS Previous Signature: 02/12/2017 12:51:18 PM Version By: Evlyn KannerBritto, Laney Bagshaw MD, FACS Gough, Nicki GuadalajaraJANET B.  ([id number]) Entered By: Evlyn KannerBritto, Jashira Cotugno on 02/12/2017 13:02:50 Weinrich, Nicki GuadalajaraJANET B. ([id number]) -------------------------------------------------------------------------------- Physical  Exam Details Patient Name: [name], [name] Date of Service: 02/12/2017 12:30 PM Medical Record Number: [id number] Patient Account Number: [id number] Date of Birth/Sex: [birth year] (56 y.o. Female) Treating RN: Ashok Cordia, Debi Primary Care Provider: WHITE, Lanora Manis Other Clinician: Referring Provider: WHITE, Lanora Manis Treating Provider/Extender: Rudene Re in Treatment: 6 Constitutional . Pulse regular. Respirations normal and unlabored. Afebrile. . Eyes Nonicteric. Reactive to light. Ears, Nose, Mouth, and Throat Lips, teeth, and gums WNL.Marland Kitchen Moist mucosa without lesions. Neck supple and nontender. No palpable supraclavicular or cervical adenopathy. Normal sized without goiter. Respiratory WNL. No retractions.. Cardiovascular Pedal Pulses WNL. No clubbing, cyanosis or edema. Lymphatic No adneopathy. No adenopathy. No adenopathy. Musculoskeletal Adexa without tenderness or enlargement.. Digits and nails w/o clubbing, cyanosis, infection, petechiae, ischemia, or inflammatory conditions.. Integumentary (Hair, Skin) No suspicious lesions. No crepitus or fluctuance. No peri-wound warmth or erythema. No masses.Marland Kitchen Psychiatric Judgement and insight Intact.. No evidence of depression, anxiety, or agitation.. Notes the right groin wound is completely closed. She has a large fluctuant swelling which is approximately 3 cm in diameter with very thin skin over this and it is almost ready to rupture. Electronic Signature(s) Signed: 02/12/2017 1:03:31 PM By: Evlyn Kanner MD, FACS Entered By: Evlyn Kanner on 02/12/2017 13:03:31 Shareef, Nicki [name] ([id number]) -------------------------------------------------------------------------------- Physician Orders Details Patient Name: Danielle Seen B. Date of  Service: 02/12/2017 12:30 PM Medical Record Number: [id number] Patient Account Number: [id number] Date of Birth/Sex: 02-25-1961 (56 y.o. 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 o Clean wound with wound cleanser. Anesthetic Wound #1 Right Ischium o Topical Lidocaine 4% cream applied to wound bed prior to debridement Secondary Dressing Wound #1 Right Ischium o Dry Gauze o Other - tape Dressing Change Frequency Wound #1 Right Ischium o Change dressing every day. Follow-up Appointments Wound #1 Right Ischium o Other: - Referred to [facility] Edema Control Wound #1 Right Ischium o Elevate legs to the level of the heart and pump ankles as often as possible Schoppe, Avree B. ([id number]) Additional Orders / Instructions Wound #1 Right Ischium o Stop Smoking o Increase protein intake. Medications-please add to medication list. Wound #1 Right Ischium o Other: - Vitamin C, Zinc, MVI Electronic Signature(s) Signed: 02/12/2017 4:24:31 PM By: Evlyn Kanner MD, FACS Signed: 02/12/2017 4:42:53 PM By: Alejandro Mulling Entered By: Alejandro Mulling on 02/12/2017 13:04:16 Pauli, Nicki [name] ([id number]) -------------------------------------------------------------------------------- Problem List Details Patient Name:  Frogge, Srihitha B. Date of Service: 02/12/2017 12:30 PM Medical Record Number: [id number] Patient Account Number: [id number] Date of Birth/Sex: [birth year] (56 y.o. 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 ([id number]) -------------------------------------------------------------------------------- Progress Note Details Patient Name: Shellenbarger, Shalena B. Date of Service: 02/12/2017 12:30 PM Medical Record Number: [id number] Patient Account Number: [id number] Date of Birth/Sex: [birth year] (56 y.o. Female) Treating RN: Ashok CordiaPinkerton, Debi Primary Care Provider: WHITE, Lanora ManisELIZABETH Other Clinician: Referring Provider: WHITE, Lanora ManisELIZABETH Treating Provider/Extender: Rudene ReBritto, Shiana Rappleye Weeks in Treatment: 6 Subjective Chief Complaint Information obtained from Patient Patient presents to the wound care center with open non-healing surgical wound to the right upper thigh History of Present Illness (HPI) The following HPI elements were documented for the patient's wound: Location: right upper thigh Quality: Patient reports experiencing a dull pain to affected area(s). Severity: Patient states wound are getting better Duration: Patient has had the wound for < 6  weeks prior to presenting for treatment Timing: Pain in wound is Intermittent (comes and goes Context: The wound occurred when the patient had incision and drainage of a hematoma of the right thigh Modifying Factors: Other treatment(s) tried include:washing with dilute hydrogen peroxide Associated Signs and Symptoms: Patient reports having increase discharge. 56 year old female who was admitted to the hospital on 5/24 and kept overnight for a right hip hematoma which an incision and drainage was done in the OR. inpatient she was treated with IV antibiotics ( vancomycin and Ancef) and discharged home on Bactrim DS. the patient has several comorbidities including a former smoker who quit in January of this year and was also a heavy drinker in the past. past medical history significant for COPD, cirrhosis of the liver, arthritis, tobacco abuse and alcohol abuse. 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 The patient has given up cigarettes in January of this year but smokes E cigarette since then. She has given up a heavy alcohol intake for the last 7 years 01/05/17 patient's right lateral hip wound appears to be doing fairly well on evaluation today. She shows no signs or evidence of significant infection and she has been tolerating the dressing changes without complication. She continues to have some depth and undermining in regard to her wound. Today we are going to initiate the SNAP Vac for her. 01/19/17 Patient appears to be doing well with current treatment. She has no evidence of infection currently and is tolerating the SNAP VAC very well. 01/26/17 on evaluation today patient appears to be doing well with the current wound VAC. With that being said the whole has gotten to the point that we are really no longer able to pack this with the foam for the vac. For that reason it appears that we are going to have to do something different going forward  today. Fortunately there is no immediate discomfort in the region of her wound and there's no evidence of infection. [name], Kassey B. ([id number]) 02/22/2017 -- fluid from right groin collection showed no growth in 2 days Objective Constitutional Pulse regular. Respirations normal and unlabored. Afebrile. Vitals Time Taken: 12:41 PM, Height: 63 in, Weight: 187.3 lbs, BMI: 33.2, Temperature: 98.4 F, Pulse: 76 bpm, Respiratory Rate: 20 breaths/min, Blood Pressure: 151/73 mmHg. Eyes Nonicteric. Reactive to light. Ears, Nose, Mouth, and Throat Lips, teeth, and gums WNL.Marland Kitchen. Moist mucosa without lesions. Neck supple and nontender. No palpable supraclavicular or cervical adenopathy. Normal sized without goiter. Respiratory WNL. No retractions.. Cardiovascular Pedal Pulses WNL. No clubbing, cyanosis or edema. Lymphatic No adneopathy. No adenopathy. No adenopathy. Musculoskeletal Adexa without tenderness or enlargement.. Digits and  nails w/o clubbing, cyanosis, infection, petechiae, ischemia, or inflammatory conditions.Marland Kitchen Psychiatric Judgement and insight Intact.. No evidence of depression, anxiety, or agitation.. General Notes: the right groin wound is completely closed. She has a large fluctuant swelling which is approximately 3 cm in diameter with very thin skin over this and it is almost ready to rupture. Integumentary (Hair, Skin) Hirata, Bexlee B. ([id number]) No suspicious lesions. No crepitus or fluctuance. No peri-wound warmth or erythema. No masses.. Wound #1 status is Open. Original cause of wound was Surgical Injury. The wound is located on the Right Ischium. The wound measures 0.1cm length x 0.1cm width x 0.1cm depth; 0.008cm^2 area and 0.001cm^3 volume. There is no tunneling or undermining noted. There is a large amount of serosanguineous drainage noted. The wound margin is distinct with the outline attached to the wound base. There is large (67-100%) red granulation within the  wound bed. There is no necrotic tissue within the wound bed. Periwound temperature was noted as No Abnormality. The periwound has tenderness on palpation. 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 The patient does not want to go back to her original orthopedic surgeon as she has been dissatisfied with his care. She prefers to go to [facility], and I would be happy to send my notes to the office of her choice. In the meanwhile, I have recommended a local dressing and bordered foam to keep from fluid draining onto her clothes. She is discharged from the wound care services and will be seen back as needed. Electronic Signature(s) Signed: 02/12/2017 1:06:14 PM By: Evlyn Kanner MD, FACS Entered By: Evlyn Kanner on 02/12/2017 13:06:14 Gann, Nicki [name] ([id number]) -------------------------------------------------------------------------------- SuperBill Details Patient Name: Danielle Seen B. Date of Service: 02/12/2017 Medical Record Number: [id number] Patient Account Number: [id number] Date of Birth/Sex: 06-Jan-1961 (56 y.o. 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 Disruption of external operation (surgical) wound, not elsewhere classified,  initial T81.31XA encounter 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 dependence, cigarettes, with other nicotine-induced disorders L02.214 Cutaneous abscess of groin Facility Procedures CPT4 Code: 08657846 Description: 99213 - WOUND CARE VISIT-LEV 3 EST PT Modifier: Quantity: 1 Physician Procedures CPT4: Description Modifier Quantity Code [id number] 99213 - WC PHYS LEVEL 3 - EST PT 1 ICD-10 Description Diagnosis 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

## 2017-02-14 NOTE — Progress Notes (Signed)
[name], Rodney B. ([id number]) Visit Report for 02/12/2017 Arrival Information Details Patient Name: [name], [name] Date of Service: 02/12/2017 12:30 PM Medical Record Number: [id number] Patient Account Number: [id number] Date of Birth/Sex: Aug 13, 1960 (56 y.o. Female) Treating RN: Ashok CordiaPinkerton, Debi Primary Care Douglass Dunshee: WHITE, [name] Other Clinician: Referring Daquavion Catala: WHITE, [name] Treating Shaquitta Burbridge/Extender: Rudene ReBritto, Errol Weeks in Treatment: 6 Visit Information History Since Last Visit All ordered tests and consults were completed: No Patient Arrived: Ambulatory Added or deleted any medications: No Arrival Time: 12:38 Any new allergies or adverse reactions: No Accompanied By: husband Had a fall or experienced change in No Transfer Assistance: None activities of daily living that may affect Patient Identification Verified: Yes risk of falls: Secondary Verification Process Yes Signs or symptoms of abuse/neglect since last No Completed: visito Patient Requires Transmission-Based No Hospitalized since last visit: No Precautions: Has Dressing in Place as Prescribed: Yes Patient Has Alerts: No Pain Present Now: No Electronic Signature(s) Signed: 02/12/2017 4:42:53 PM By: Alejandro MullingPinkerton, Debra Entered By: Alejandro MullingPinkerton, Debra on 02/12/2017 12:40:46 Reetz, Nicki GuadalajaraJANET B. ([id number]) -------------------------------------------------------------------------------- Clinic Level of Care Assessment Details Patient Name: Danielle SeenWORKMAN, Adreonna B. Date of Service: 02/12/2017 12:30 PM Medical Record Number: [id number] Patient Account Number: [id number] Date of Birth/Sex: Aug 13, 1960 (56 y.o. Female) Treating RN: Ashok CordiaPinkerton, Debi Primary Care Ahtziry Saathoff: WHITE, [name] Other Clinician: Referring Numa Schroeter: WHITE, [name] Treating Lataria Courser/Extender: Rudene ReBritto, Errol Weeks in Treatment: 6 Clinic Level of Care Assessment Items TOOL 4 Quantity Score X - Use when only an EandM is performed on FOLLOW-UP visit 1  0 ASSESSMENTS - Nursing Assessment / Reassessment X - Reassessment of Co-morbidities (includes updates in patient status) 1 10 X - Reassessment of Adherence to Treatment Plan 1 5 ASSESSMENTS - Wound and Skin Assessment / Reassessment X - Simple Wound Assessment / Reassessment - one wound 1 5 []  - Complex Wound Assessment / Reassessment - multiple wounds 0 []  - Dermatologic / Skin Assessment (not related to wound area) 0 ASSESSMENTS - Focused Assessment []  - Circumferential Edema Measurements - multi extremities 0 []  - Nutritional Assessment / Counseling / Intervention 0 []  - Lower Extremity Assessment (monofilament, tuning fork, pulses) 0 []  - Peripheral Arterial Disease Assessment (using hand held doppler) 0 ASSESSMENTS - Ostomy and/or Continence Assessment and Care []  - Incontinence Assessment and Management 0 []  - Ostomy Care Assessment and Management (repouching, etc.) 0 PROCESS - Coordination of Care []  - Simple Patient / Family Education for ongoing care 0 X - Complex (extensive) Patient / Family Education for ongoing care 1 20 X - Staff obtains ChiropractorConsents, Records, Test Results / Process Orders 1 10 []  - Staff telephones HHA, Nursing Homes / Clarify orders / etc 0 []  - Routine Transfer to another Facility (non-emergent condition) 0 Schue, Otelia B. ([id number]) []  - Routine Hospital Admission (non-emergent condition) 0 []  - New Admissions / Manufacturing engineernsurance Authorizations / Ordering NPWT, Apligraf, etc. 0 []  - Emergency Hospital Admission (emergent condition) 0 X - Simple Discharge Coordination 1 10 []  - Complex (extensive) Discharge Coordination 0 PROCESS - Special Needs []  - Pediatric / Minor Patient Management 0 []  - Isolation Patient Management 0 []  - Hearing / Language / Visual special needs 0 []  - Assessment of Community assistance (transportation, D/C planning, etc.) 0 []  - Additional assistance / Altered mentation 0 []  - Support Surface(s) Assessment (bed, cushion, seat, etc.)  0 INTERVENTIONS - Wound Cleansing / Measurement X - Simple Wound Cleansing - one wound 1 5 []  - Complex Wound Cleansing - multiple wounds 0 X - Wound Imaging (photographs -  any number of wounds) 1 5 []  - Wound Tracing (instead of photographs) 0 X - Simple Wound Measurement - one wound 1 5 []  - Complex Wound Measurement - multiple wounds 0 INTERVENTIONS - Wound Dressings X - Small Wound Dressing one or multiple wounds 1 10 []  - Medium Wound Dressing one or multiple wounds 0 []  - Large Wound Dressing one or multiple wounds 0 X - Application of Medications - topical 1 5 []  - Application of Medications - injection 0 INTERVENTIONS - Miscellaneous []  - External ear exam 0 Roy, [name] ([id number]) []  - Specimen Collection (cultures, biopsies, blood, body fluids, etc.) 0 []  - Specimen(s) / Culture(s) sent or taken to Lab for analysis 0 []  - Patient Transfer (multiple staff / Michiel Sites Lift / Similar devices) 0 []  - Simple Staple / Suture removal (25 or less) 0 []  - Complex Staple / Suture removal (26 or more) 0 []  - Hypo / Hyperglycemic Management (close monitor of Blood Glucose) 0 []  - Ankle / Brachial Index (ABI) - do not check if billed separately 0 X - Vital Signs 1 5 Has the patient been seen at the hospital within the last three years: Yes Total Score: 95 Level Of Care: New/Established - Level 3 Electronic Signature(s) Signed: 02/12/2017 4:42:53 PM By: Alejandro Mulling Entered By: Alejandro Mulling on 02/12/2017 14:53:51 Everitt, Nicki [name] ([id number]) -------------------------------------------------------------------------------- Encounter Discharge Information Details Patient Name: Danielle Seen B. Date of Service: 02/12/2017 12:30 PM Medical Record Number: [id number] Patient Account Number: [id number] Date of Birth/Sex: 1960/12/28 (56 y.o. Female) Treating RN: Ashok Cordia, Debi Primary Care Isabelle Matt: WHITE, Danielle Manis Other Clinician: Referring Jennie Bolar: WHITE, Danielle Manis Treating  Trayce Maino/Extender: Rudene Re in Treatment: 6 Encounter Discharge Information Items Discharge Pain Level: 0 Discharge Condition: Stable Ambulatory Status: Ambulatory Discharge Destination: Home Transportation: Private Auto Accompanied By: husband Schedule Follow-up Appointment: Yes Medication Reconciliation completed No and provided to Patient/Care Alima Naser: Patient Clinical Summary of Care: Declined Electronic Signature(s) Signed: 02/12/2017 4:42:53 PM By: Alejandro Mulling Previous Signature: 02/12/2017 1:42:28 PM Version By: Gwenlyn Perking Entered By: Alejandro Mulling on 02/12/2017 14:54:59 Broden, Nicki [name] ([id number]) -------------------------------------------------------------------------------- Lower Extremity Assessment Details Patient Name: Helder, Yarielys B. Date of Service: 02/12/2017 12:30 PM Medical Record Number: [id number] Patient Account Number: [id number] Date of Birth/Sex: 04/12/1961 (56 y.o. Female) Treating RN: Ashok Cordia, Debi Primary Care Cristy Colmenares: Doristine Mango Other Clinician: Referring Palak Tercero: WHITE, Danielle Manis Treating Dia Jefferys/Extender: Rudene Re in Treatment: 6 Electronic Signature(s) Signed: 02/12/2017 4:42:53 PM By: Alejandro Mulling Entered By: Alejandro Mulling on 02/12/2017 12:43:02 Trosper, Nicki [name] ([id number]) -------------------------------------------------------------------------------- Multi Wound Chart Details Patient Name: Belford, Lilliah B. Date of Service: 02/12/2017 12:30 PM Medical Record Number: [id number] Patient Account Number: [id number] Date of Birth/Sex: 1960/08/13 (56 y.o. Female) Treating RN: Ashok Cordia, Debi Primary Care Dequane Strahan: WHITE, Danielle Manis Other Clinician: Referring Draven Natter: WHITE, Danielle Manis Treating Zerah Hilyer/Extender: Rudene Re in Treatment: 6 Vital Signs Height(in): 63 Pulse(bpm): 76 Weight(lbs): 187.3 Blood Pressure 151/73 (mmHg): Body Mass Index(BMI): 33 Temperature(F):  98.4 Respiratory Rate 20 (breaths/min): Photos: [1:No Photos] [N/A:N/A] Wound Location: [1:Right Ischium] [N/A:N/A] Wounding Event: [1:Surgical Injury] [N/A:N/A] Primary Etiology: [1:Open Surgical Wound] [N/A:N/A] Comorbid History: [1:Chronic Obstructive Pulmonary Disease (COPD), Hypertension, Cirrhosis , Osteoarthritis] [N/A:N/A] Date Acquired: [1:12/15/2016] [N/A:N/A] Weeks of Treatment: [1:6] [N/A:N/A] Wound Status: [1:Open] [N/A:N/A] Measurements L x W x D 0.1x0.1x0.1 [N/A:N/A] (cm) Area (cm) : [1:0.008] [N/A:N/A] Volume (cm) : [1:0.001] [N/A:N/A] % Reduction in Area: [1:99.50%] [N/A:N/A] % Reduction in Volume: 99.90% [N/A:N/A] Classification: [1:Partial Thickness] [N/A:N/A] Exudate Amount: [1:Large] [N/A:N/A] Exudate Type: [1:Serosanguineous] [N/A:N/A] Exudate  Color: [1:red, brown] [N/A:N/A] Wound Margin: [1:Distinct, outline attached] [N/A:N/A] Granulation Amount: [1:Large (67-100%)] [N/A:N/A] Granulation Quality: [1:Red] [N/A:N/A] Necrotic Amount: [1:None Present (0%)] [N/A:N/A] Epithelialization: [1:None] [N/A:N/A] Periwound Skin Texture: No Abnormalities Noted [N/A:N/A] Periwound Skin [1:No Abnormalities Noted] [N/A:N/A] Moisture: Copland, Annelie B. ([id number]) Periwound Skin Color: No Abnormalities Noted N/A N/A Temperature: No Abnormality N/A N/A Tenderness on Yes N/A N/A Palpation: Wound Preparation: Ulcer Cleansing: N/A N/A Rinsed/Irrigated with Saline Topical Anesthetic Applied: Other: lidocaine 4% Treatment Notes Electronic Signature(s) Signed: 02/12/2017 1:02:35 PM By: Evlyn Kanner MD, FACS Entered By: Evlyn Kanner on 02/12/2017 13:02:34 Wilhide, Nicki [name] ([id number]) -------------------------------------------------------------------------------- Multi-Disciplinary Care Plan Details Patient Name: Danielle Seen B. Date of Service: 02/12/2017 12:30 PM Medical Record Number: [id number] Patient Account Number: [id number] Date of Birth/Sex: [birth year] (56  y.o. Female) Treating RN: Ashok Cordia, Debi Primary Care Conswella Bruney: WHITE, Danielle Manis Other Clinician: Referring Damacio Weisgerber: WHITE, Danielle Manis Treating Ladale Sherburn/Extender: Rudene Re in Treatment: 6 Active Inactive ` Abuse / Safety / Falls / Self Care Management Nursing Diagnoses: Potential for falls Goals: Patient will remain injury free related to falls Date Initiated: 12/29/2016 Target Resolution Date: 04/18/2017 Goal Status: Active Interventions: Assess personal safety and home safety (as indicated) on admission and as needed Assess self care needs on admission and as needed Notes: ` Orientation to the Wound Care Program Nursing Diagnoses: Knowledge deficit related to the wound healing center program Goals: Patient/caregiver will verbalize understanding of the Wound Healing Center Program Date Initiated: 12/29/2016 Target Resolution Date: 01/17/2017 Goal Status: Active Interventions: Provide education on orientation to the wound center Notes: ` Pain, Acute or Chronic Nursing Diagnoses: Pain, acute or chronic: actual or potential [place], Jacy B. ([id number]) Potential alteration in comfort, pain Goals: Patient/caregiver will verbalize adequate pain control between visits Date Initiated: 12/29/2016 Target Resolution Date: 03/21/2017 Goal Status: Active Interventions: Assess comfort goal upon admission Complete pain assessment as per visit requirements Notes: ` Wound/Skin Impairment Nursing Diagnoses: Impaired tissue integrity Knowledge deficit related to smoking impact on wound healing Knowledge deficit related to ulceration/compromised skin integrity Goals: Ulcer/skin breakdown will have a volume reduction of 80% by week 12 Date Initiated: 12/29/2016 Target Resolution Date: 04/11/2017 Goal Status: Active Interventions: Assess patient/caregiver ability to perform ulcer/skin care regimen upon admission and as needed Assess ulceration(s) every  visit Notes: Electronic Signature(s) Signed: 02/12/2017 4:42:53 PM By: Alejandro Mulling Entered By: Alejandro Mulling on 02/12/2017 12:51:57 Ferrera, Nicki [name] ([id number]) -------------------------------------------------------------------------------- Pain Assessment Details Patient Name: Danielle Seen B. Date of Service: 02/12/2017 12:30 PM Medical Record Number: [id number] Patient Account Number: [id number] Date of Birth/Sex: [birth year] (56 y.o. Female) Treating RN: Ashok Cordia, Debi Primary Care Kellis Mcadam: WHITE, Danielle Manis Other Clinician: Referring Morene Cecilio: WHITE, Danielle Manis Treating Gwyndolyn Guilford/Extender: Rudene Re in Treatment: 6 Active Problems Location of Pain Severity and Description of Pain Patient Has Paino No Site Locations With Dressing Change: No Pain Management and Medication Current Pain Management: Electronic Signature(s) Signed: 02/12/2017 4:42:53 PM By: Alejandro Mulling Entered By: Alejandro Mulling on 02/12/2017 12:41:01 Nathaniel, Nicki [name] ([id number]) -------------------------------------------------------------------------------- Patient/Caregiver Education Details Patient Name: Danielle Seen B. Date of Service: 02/12/2017 12:30 PM Medical Record Number: [id number] Patient Account Number: [id number] Date of Birth/Gender: Apr 16, 1961 (56 y.o. Female) Treating RN: Ashok Cordia, Debi Primary Care Physician: WHITE, Danielle Manis Other Clinician: Referring Physician: Doristine Mango Treating Physician/Extender: Rudene Re in Treatment: 6 Education Assessment Education Provided To: Patient Education Topics Provided Wound/Skin Impairment: Handouts: Other: go to [facility] ortho as referrred Methods: Demonstration, Explain/Verbal Responses: State content correctly Electronic Signature(s) Signed: 02/12/2017 4:42:53 PM By: Alejandro Mulling Entered  By: Alejandro MullingPinkerton, Debra on 02/12/2017 14:55:41 Sorber, Nicki GuadalajaraJANET B.  ([id number]) -------------------------------------------------------------------------------- Wound Assessment Details Patient Name: Akerley, Aneya B. Date of Service: 02/12/2017 12:30 PM Medical Record Number: [id number] Patient Account Number: [id number] Date of Birth/Sex: 08-28-1960 (56 y.o. Female) Treating RN: Ashok CordiaPinkerton, Debi Primary Care Kamalei Roeder: WHITE, [name] Other Clinician: Referring Jamarius Saha: WHITE, [name] Treating Michaelann Gunnoe/Extender: Rudene ReBritto, Errol Weeks in Treatment: 6 Wound Status Wound Number: 1 Primary Open Surgical Wound Etiology: Wound Location: Right Ischium Wound Open Wounding Event: Surgical Injury Status: Date Acquired: 12/15/2016 Comorbid Chronic Obstructive Pulmonary Weeks Of Treatment: 6 History: Disease (COPD), Hypertension, Clustered Wound: No Cirrhosis , Osteoarthritis Photos Photo Uploaded By: Alejandro MullingPinkerton, Debra on 02/12/2017 15:01:47 Wound Measurements Length: (cm) 0.1 Width: (cm) 0.1 Depth: (cm) 0.1 Area: (cm) 0.008 Volume: (cm) 0.001 % Reduction in Area: 99.5% % Reduction in Volume: 99.9% Epithelialization: None Tunneling: No Undermining: No Wound Description Classification: Partial Thickness Foul Odor Aft Wound Margin: Distinct, outline attached Slough/Fibrin Exudate Amount: Large Exudate Type: Serosanguineous Exudate Color: red, brown er Cleansing: No o No Wound Bed Granulation Amount: Large (67-100%) Granulation Quality: Red Necrotic Amount: None Present (0%) Lamke, Bernece B. ([id number]) Periwound Skin Texture Texture Color No Abnormalities Noted: No No Abnormalities Noted: No Moisture Temperature / Pain No Abnormalities Noted: No Temperature: No Abnormality Tenderness on Palpation: Yes Wound Preparation Ulcer Cleansing: Rinsed/Irrigated with Saline Topical Anesthetic Applied: Other: lidocaine 4%, Treatment Notes Wound #1 (Right Ischium) 1. Cleansed with: Clean wound with Normal Saline 2. Anesthetic Topical  Lidocaine 4% cream to wound bed prior to debridement 5. Secondary Dressing Applied Dry Gauze 7. Secured with Secretary/administratorTape Electronic Signature(s) Signed: 02/12/2017 4:42:53 PM By: Alejandro MullingPinkerton, Debra Entered By: Alejandro MullingPinkerton, Debra on 02/12/2017 12:47:51 Gift, Nicki GuadalajaraJANET B. ([id number]) -------------------------------------------------------------------------------- Vitals Details Patient Name: Danielle SeenWORKMAN, Letishia B. Date of Service: 02/12/2017 12:30 PM Medical Record Number: [id number] Patient Account Number: [id number] Date of Birth/Sex: 08-28-1960 (56 y.o. Female) Treating RN: Ashok CordiaPinkerton, Debi Primary Care Terecia Plaut: WHITE, [name] Other Clinician: Referring Lupita Rosales: WHITE, [name] Treating Oather Muilenburg/Extender: Rudene ReBritto, Errol Weeks in Treatment: 6 Vital Signs Time Taken: 12:41 Temperature (F): 98.4 Height (in): 63 Pulse (bpm): 76 Weight (lbs): 187.3 Respiratory Rate (breaths/min): 20 Body Mass Index (BMI): 33.2 Blood Pressure (mmHg): 151/73 Reference Range: 80 - 120 mg / dl Electronic Signature(s) Signed: 02/12/2017 4:42:53 PM By: Alejandro MullingPinkerton, Debra Entered By: Alejandro MullingPinkerton, Debra on 02/12/2017 12:42:50

## 2017-02-25 ENCOUNTER — Encounter: Payer: Medicare Other | Admitting: Physician Assistant

## 2017-02-25 DIAGNOSIS — L97113 Non-pressure chronic ulcer of right thigh with necrosis of muscle: Secondary | ICD-10-CM | POA: Diagnosis not present

## 2017-02-27 NOTE — Progress Notes (Signed)
MCCAYLA, SHIMADA ([id number]) Visit Report for 02/25/2017 Chief Complaint Document Details Patient Name: [name], [name] Date of Service: 02/25/2017 8:15 AM Medical Record Number: [id number] Patient Account Number: [id number] Date of Birth/Sex: 09/05/1960 (56 y.o. Female) Treating RN: Afful, RN, BSN, [employer] Primary Care Provider: Doristine Mango Other Clinician: Referring Provider: WHITE, Lanora Manis Treating Provider/Extender: Linwood Dibbles, Danyle Boening Weeks in Treatment: 8 Information Obtained from: Patient Chief Complaint Patient presents to the wound care center with open non-healing surgical wound to the right upper thigh Electronic Signature(s) Signed: 02/26/2017 12:37:32 AM By: Lenda Kelp PA-C Entered By: Lenda Kelp on 02/25/2017 08:47:00 [name] ([id number]) -------------------------------------------------------------------------------- Debridement Details Patient Name: Danielle Seen B. Date of Service: 02/25/2017 8:15 AM Medical Record Number: [id number] Patient Account Number: [id number] Date of Birth/Sex: [birth year] (56 y.o. Female) Treating RN: Afful, RN, BSN, [employer] Primary Care Provider: WHITE, Lanora Manis Other Clinician: Referring Provider: WHITE, ELIZABETH Treating Provider/Extender: Linwood Dibbles, Pearse Shiffler Weeks in Treatment: 8 Debridement Performed for Wound #1 Right Ischium Assessment: Performed By: Physician STONE III, Zidan Helget E., PA-C Debridement: Debridement Pre-procedure Verification/Time Out Yes - 08:33 Taken: Start Time: 08:33 Pain Control: Lidocaine 4% Topical Solution Level: Skin/Subcutaneous Tissue Total Area Debrided (L x 1 (cm) x 1.5 (cm) = 1.5 (cm) W): Tissue and other Non-Viable, Fat, Fibrin/Slough, Subcutaneous material debrided: Instrument: Curette Bleeding: Minimum Hemostasis Achieved: Pressure End Time: 08:37 Procedural Pain: 0 Post Procedural Pain: 0 Response to Treatment: Procedure was tolerated well Post Debridement Measurements of  Total Wound Length: (cm) 1.5 Width: (cm) 2 Depth: (cm) 11 Volume: (cm) 25.918 Character of Wound/Ulcer Post Stable Debridement: Post Procedure Diagnosis Same as Pre-procedure Electronic Signature(s) Signed: 02/25/2017 12:56:07 PM By: Elpidio Eric BSN, RN Signed: 02/26/2017 12:37:32 AM By: Lenda Kelp PA-C Entered By: Elpidio Eric on 02/25/2017 08:40:08 [name] ([id number]) -------------------------------------------------------------------------------- HPI Details Patient Name: Danielle Seen B. Date of Service: 02/25/2017 8:15 AM Medical Record Number: [id number] Patient Account Number: [id number] Date of Birth/Sex: 10-25-1960 (56 y.o. Female) Treating RN: Afful, RN, BSN, [employer] Primary Care Provider: Doristine Mango Other Clinician: Referring Provider: WHITE, ELIZABETH Treating Provider/Extender: Linwood Dibbles, Sharone Almond Weeks in Treatment: 8 History of Present Illness Location: right upper thigh Quality: Patient reports experiencing a dull pain to affected area(s). Severity: Patient states wound are getting better Duration: Patient has had the wound for < 6 weeks prior to presenting for treatment Timing: Pain in wound is Intermittent (comes and goes Context: The wound occurred when the patient had incision and drainage of a hematoma of the right thigh Modifying Factors: Other treatment(s) tried include:washing with dilute hydrogen peroxide Associated Signs and Symptoms: Patient reports having increase discharge. HPI Description: 56 year old female who was admitted to the hospital on 524 and kept overnight for a right hip hematoma which an incision and drainage was done in the OR. inpatient she was treated with IV antibiotics( vancomycin and Ancef) and discharged home on Bactrim DS. the patient has several comorbidities including a former smoker who quit in January of this year and was also a heavy drinker in the past. past medical history significant for COPD, cirrhosis of  the liver, arthritis, tobacco abuse and alcohol abuse. 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 The patient has given up cigarettes in January of this year but smokes E cigarette since then. She has given up a heavy alcohol intake for the last 7 years 01/05/17 patient's right lateral hip wound appears to be doing fairly well on evaluation today. She shows  no signs or evidence of significant infection and she has been tolerating the dressing changes without complication. She continues to have some depth and undermining in regard to her wound. Today we are going to initiate the SNAP Vac for her. 01/19/17 Patient appears to be doing well with current treatment. She has no evidence of infection currently and is tolerating the SNAP VAC very well. 01/26/17 on evaluation today patient appears to be doing well with the current wound VAC. With that being said the whole has gotten to the point that we are really no longer able to pack this with the foam for the vac. For that reason it appears that we are going to have to do something different going forward today. Fortunately there is no immediate discomfort in the region of her wound and there's no evidence of infection. 02/22/2017 -- fluid from right groin collection showed no growth in 2 days 02/25/17 on evaluation today patient is seen in regard to a reopening of her right hip wound. Unfortunately she had some hyper granular tissue noted and they go back to [facility] for evaluation orthopedics and they are recommending essentially further evaluation. With that being said she states in the interim she figured that she would come back for evaluation here to see if there's anything that we could do to help. She has no [name], [name] ([id number]) peer doing discharge noted although she states that it does seem that there some white tissue and the wound bed. No fevers, chills, nausea, or vomiting noted at this  time. Electronic Signature(s) Signed: 02/26/2017 12:37:32 AM By: Lenda Kelp PA-C Entered By: Lenda Kelp on 02/25/2017 08:47:31 [name] ([id number]) -------------------------------------------------------------------------------- Physical Exam Details Patient Name: [name], [name] Date of Service: 02/25/2017 8:15 AM Medical Record Number: [id number] Patient Account Number: [id number] Date of Birth/Sex: [birth year] (56 y.o. Female) Treating RN: Clover Mealy, RN, BSN, [employer] Primary Care Provider: Doristine Mango Other Clinician: Referring Provider: WHITE, ELIZABETH Treating Provider/Extender: STONE III, Savaughn Karwowski Weeks in Treatment: 8 Constitutional Well-nourished and well-hydrated in no acute distress. Respiratory normal breathing without difficulty. Psychiatric this patient is able to make decisions and demonstrates good insight into disease process. Alert and Oriented x 3. pleasant and cooperative. Notes That's wound appears to show only hyper granular tissue that is extended beyond the surface of the wound and preventing any closure. This did required debridement today which was performed and following I did have to use some silver nitrate as well. Unfortunately this opened into a tunnel which extended back into the region of the hip joint. Unfortunately this is a [place] finding that was completely unexpected. Electronic Signature(s) Signed: 02/26/2017 12:37:32 AM By: Lenda Kelp PA-C Entered By: Lenda Kelp on 02/25/2017 08:48:34 [place], [name] ([id number]) -------------------------------------------------------------------------------- Physician Orders Details Patient Name: Danielle Seen B. Date of Service: 02/25/2017 8:15 AM Medical Record Number: [id number] Patient Account Number: [id number] Date of Birth/Sex: [birth year] (56 y.o. 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 Disruption of external operation (surgical) wound, not elsewhere classified, initial T81.31XA encounter 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 dependence, cigarettes, with other nicotine-induced disorders L02.214 Cutaneous abscess of groin Wound Cleansing Wound #1 Right Ischium o Cleanse wound with mild soap and water Anesthetic Wound #1 Right Ischium o Topical Lidocaine 4%  cream applied to wound bed prior to debridement Primary Wound Dressing Wound #1 Right Ischium o Iodoform packing Gauze Secondary Dressing Wound #1 Right Ischium o Boardered Foam Dressing Dressing Change Frequency Wound #1 Right Ischium o Change dressing every day. Electronic Signature(s) Signed: 02/25/2017 12:56:07 PM By: Elpidio Eric BSN, RN Signed: 02/26/2017 12:37:32 AM By: Lenda Kelp PA-C Entered By: Elpidio Eric on 02/25/2017 08:39:10 [name] ([id number]) [name] ([id number]) -------------------------------------------------------------------------------- Problem List Details Patient Name: KHALA, TARTE B. Date of Service: 02/25/2017 8:15 AM Medical Record Number: [id number] Patient Account Number: [id number] Date of Birth/Sex: Nov 12, 1960 (56 y.o. 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 of 12/29/2016 Yes muscle T81.31XA Disruption of external operation (surgical) wound, not 12/29/2016 Yes elsewhere classified, initial encounter 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 dependence, cigarettes, with  other nicotine- 12/29/2016 Yes induced disorders L02.214 Cutaneous abscess of groin 02/12/2017 Yes Inactive Problems Resolved Problems Electronic Signature(s) Signed: 02/26/2017 12:37:32 AM By: Lenda Kelp PA-C Entered By: Lenda Kelp on 02/25/2017 08:20:10 [name] ([id number]) -------------------------------------------------------------------------------- Progress Note Details Patient Name: Danielle Seen B. Date of Service: 02/25/2017 8:15 AM Medical Record Number: [id number] Patient Account Number: [id number] Date of Birth/Sex: Aug 02, 1960 (56 y.o. Female) Treating RN: Afful, RN, BSN, [employer] Primary Care Provider: Doristine Mango Other Clinician: Referring Provider: WHITE, ELIZABETH Treating Provider/Extender: Linwood Dibbles, Dacotah Cabello Weeks in Treatment: 8 Subjective Chief Complaint Information obtained from Patient Patient presents to the wound care center with open non-healing surgical wound to the right upper thigh History of Present Illness (HPI) The following HPI elements were documented for the patient's wound: Location: right upper thigh Quality: Patient reports experiencing a dull pain to affected area(s). Severity: Patient states wound are getting better Duration: Patient has had the wound for < 6 weeks prior to presenting for treatment Timing: Pain in wound is Intermittent (comes and goes Context: The wound occurred when the patient had incision and drainage of a hematoma of the right thigh Modifying Factors: Other treatment(s) tried include:washing with dilute hydrogen peroxide Associated Signs and Symptoms: Patient reports having increase discharge. 56 year old female who was admitted to the hospital on 524 and kept overnight for a right hip hematoma which an incision and drainage was done in the OR. inpatient she was treated with IV antibiotics ( vancomycin and Ancef) and discharged home on Bactrim DS. the patient has several comorbidities including a former  smoker who quit in January of this year and was also a heavy drinker in the past. past medical history significant for COPD, cirrhosis of the liver, arthritis, tobacco abuse and alcohol abuse. 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 The patient has given up cigarettes in January of this year but smokes E cigarette since then. She has given up a heavy alcohol intake for the last 7 years 01/05/17 patient's right lateral hip wound appears to be doing fairly well on evaluation today. She shows no signs or evidence of significant infection and she has been tolerating the dressing changes without complication. She continues to have some depth and undermining in regard to her wound. Today we are going to initiate the SNAP Vac for her. 01/19/17 Patient appears to be doing well with current treatment. She has no evidence of infection currently and is tolerating the  SNAP VAC very well. 01/26/17 on evaluation today patient appears to be doing well with the current wound VAC. With that being said the whole has gotten to the point that we are really no longer able to pack this with the foam for the vac. For that reason it appears that we are going to have to do something different going forward today. Fortunately there is no immediate discomfort in the region of her wound and there's no evidence of infection. [name] ([id number]) 02/22/2017 -- fluid from right groin collection showed no growth in 2 days 02/25/17 on evaluation today patient is seen in regard to a reopening of her right hip wound. Unfortunately she had some hyper granular tissue noted and they go back to [facility] for evaluation orthopedics and they are recommending essentially further evaluation. With that being said she states in the interim she figured that she would come back for evaluation here to see if there's anything that we could do to help. She has no peer doing discharge noted although  she states that it does seem that there some white tissue and the wound bed. No fevers, chills, nausea, or vomiting noted at this time. Objective Constitutional Well-nourished and well-hydrated in no acute distress. Vitals Time Taken: 8:22 AM, Height: 63 in, Weight: 187.3 lbs, BMI: 33.2, Temperature: 98.5 F, Pulse: 76 bpm, Respiratory Rate: 18 breaths/min, Blood Pressure: 128/70 mmHg. Respiratory normal breathing without difficulty. Psychiatric this patient is able to make decisions and demonstrates good insight into disease process. Alert and Oriented x 3. pleasant and cooperative. General Notes: That's wound appears to show only hyper granular tissue that is extended beyond the surface of the wound and preventing any closure. This did required debridement today which was performed and following I did have to use some silver nitrate as well. Unfortunately this opened into a tunnel which extended back into the region of the hip joint. Unfortunately this is a [place] finding that was completely unexpected. Integumentary (Hair, Skin) Wound #1 status is Open. Original cause of wound was Surgical Injury. The wound is located on the Right Ischium. The wound measures 1.5cm length x 2cm width x 11cm depth; 2.356cm^2 area and 25.918cm^3 volume. There is Fat Layer (Subcutaneous Tissue) Exposed exposed. There is no tunneling or undermining noted. There is a large amount of serosanguineous drainage noted. The wound margin is distinct with the outline attached to the wound base. There is large (67-100%) red granulation within the wound bed. There is a small (1-33%) amount of necrotic tissue within the wound bed including Adherent Slough. The periwound skin appearance did not exhibit: Callus, Crepitus, Excoriation, Induration, Rash, Scarring, Dry/Scaly, Maceration, Atrophie Blanche, Cyanosis, Ecchymosis, Hemosiderin Staining, Mottled, Pallor, Cargo, Giulia B. ([id number]) Rubor, Erythema.  Periwound temperature was noted as No Abnormality. The periwound has tenderness on palpation. 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 There was a Skin/Subcutaneous Tissue Debridement ([id number]) debridement with total area of 1.5 sq cm performed by STONE III, Pablo Mathurin E., PA-C. with the following instrument(s): Curette to remove Non-Viable tissue/material including Fat Layer (and Subcutaneous Tissue) Exposed, Fibrin/Slough, and Subcutaneous after achieving pain control using Lidocaine 4% Topical Solution. A time out was  conducted at 08:33, prior to the start of the procedure. A Minimum amount of bleeding was controlled with Pressure. The procedure was tolerated well with a pain level of 0 throughout and a pain level of 0 following the procedure. Post Debridement Measurements: 1.5cm length x 2cm width x 11cm depth; 25.918cm^3 volume. Character of Wound/Ulcer Post Debridement is stable. Post procedure Diagnosis Wound #1: Same as Pre-Procedure Plan Wound Cleansing: Wound #1 Right Ischium: Cleanse wound with mild soap and water Anesthetic: Wound #1 Right Ischium: Topical Lidocaine 4% cream applied to wound bed prior to debridement Primary Wound Dressing: [name] ([id number]) Wound #1 Right Ischium: Iodoform packing Gauze Secondary Dressing: Wound #1 Right Ischium: Boardered Foam Dressing Dressing Change Frequency: Wound #1 Right Ischium: Change dressing every day. At this point I'm gonna recommend that we switch to packing this wound with Iodoform gauze for the next week.  Unfortunately she has a significant amount of hyper granular tissue still but due to the amount of bleeding I was unable to work on this any more I did perform some cauterization with silver nitrate as well following the debridement. Nonetheless I feel that this is something she definitely does need to follow up with her specialist concerning she does have an appointment at the end of September 2018. Otherwise in the meantime we will initiate and continue with packing up until that point. We will see her one week for reevaluation. Electronic Signature(s) Signed: 02/26/2017 12:37:32 AM By: Lenda Kelp PA-C Entered By: Lenda Kelp on 02/25/2017 08:49:47 [name] ([id number]) -------------------------------------------------------------------------------- SuperBill Details Patient Name: Danielle Seen B. Date of Service: 02/25/2017 Medical Record Number: [id number] Patient Account Number: [id number] Date of Birth/Sex: 13-Jul-1961 (56 y.o. 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 Disruption of external operation (surgical) wound, not elsewhere classified, initial T81.31XA encounter 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 dependence, cigarettes, with other nicotine-induced disorders L02.214 Cutaneous abscess of groin Facility Procedures CPT4 Code Description: [id number] 11042 - DEB SUBQ TISSUE 20 SQ CM/< ICD-10 Description Diagnosis L97.113 Non-pressure chronic ulcer of right thigh with necro Modifier: sis of muscle Quantity: 1 Physician Procedures CPT4 Code Description: [id number] 11042 - WC PHYS SUBQ TISS 20 SQ CM ICD-10 Description Diagnosis L97.113 Non-pressure chronic ulcer of right thigh with necro Modifier: sis of  muscle Quantity: 1 Electronic Signature(s) Signed: 02/26/2017 12:37:32 AM By: Lenda Kelp PA-C Entered By: Lenda Kelp on 02/25/2017 08:50:01

## 2017-02-27 NOTE — Progress Notes (Signed)
[name] ([id number]) Visit Report for 02/25/2017 Arrival Information Details Patient Name: [name], [name]. Date of Service: 02/25/2017 8:15 AM Medical Record Number: [id number] Patient Account Number: [id number] Date of Birth/Sex: 03-Nov-1960 (56 y.o. Female) Treating RN: Afful, RN, BSN, [employer] Primary Care Aireal Slater: WHITE, Lanora Manis Other Clinician: Referring Maie Kesinger: WHITE, ELIZABETH Treating Bera Pinela/Extender: Linwood Dibbles, HOYT Weeks in Treatment: 8 Visit Information History Since Last Visit All ordered tests and consults were completed: No Patient Arrived: Ambulatory Added or deleted any medications: No Arrival Time: 08:21 Any new allergies or adverse reactions: No Accompanied By: self Had a fall or experienced change in No Transfer Assistance: None activities of daily living that may affect Patient Identification Verified: Yes risk of falls: Secondary Verification Process Yes Signs or symptoms of abuse/neglect since last No Completed: visito Patient Requires Transmission-Based No Hospitalized since last visit: No Precautions: Has Dressing in Place as Prescribed: Yes Patient Has Alerts: No Pain Present Now: No Electronic Signature(s) Signed: 02/25/2017 12:56:07 PM By: Elpidio Eric BSN, RN Entered By: Elpidio Eric on 02/25/2017 08:22:50 Dicenso, Nicki [name] ([id number]) -------------------------------------------------------------------------------- Encounter Discharge Information Details Patient Name: Danielle Seen B. Date of Service: 02/25/2017 8:15 AM Medical Record Number: [id number] Patient Account Number: [id number] Date of Birth/Sex: [birth year] (56 y.o. Female) Treating RN: Afful, RN, BSN, [employer] Primary Care Tupac Jeffus: Doristine Mango Other Clinician: Referring [name]: WHITE, Lanora Manis Treating Cyncere Ruhe/Extender: Linwood Dibbles, HOYT Weeks in Treatment: 8 Encounter Discharge Information Items Discharge Pain Level: 0 Discharge Condition: Stable Ambulatory Status:  Ambulatory Discharge Destination: Home Transportation: Private Auto Accompanied By: self Schedule Follow-up Appointment: No Medication Reconciliation completed and provided to Patient/Care No Marlena Barbato: Provided on Clinical Summary of Care: 02/25/2017 Form Type Recipient Paper Patient JW Electronic Signature(s) Signed: 02/25/2017 12:56:07 PM By: Elpidio Eric BSN, RN Entered By: Elpidio Eric on 02/25/2017 08:50:33 Neils, Nicki [name] ([id number]) -------------------------------------------------------------------------------- Lower Extremity Assessment Details Patient Name: [name], [name] Date of Service: 02/25/2017 8:15 AM Medical Record Number: [id number] Patient Account Number: [id number] Date of Birth/Sex: 29-May-1961 (56 y.o. Female) Treating RN: Afful, RN, BSN, [employer] Primary Care Encarnacion Bole: Doristine Mango Other Clinician: Referring [name]: WHITE, Lanora Manis Treating Aundreya Souffrant/Extender: Linwood Dibbles, HOYT Weeks in Treatment: 8 Electronic Signature(s) Signed: 02/25/2017 12:56:07 PM By: Elpidio Eric BSN, RN Entered By: Elpidio Eric on 02/25/2017 08:25:13 Kirby, Nicki [name] ([id number]) -------------------------------------------------------------------------------- Multi Wound Chart Details Patient Name: Danielle Seen B. Date of Service: 02/25/2017 8:15 AM Medical Record Number: [id number] Patient Account Number: [id number] Date of Birth/Sex: 06-Dec-1960 (56 y.o. Female) Treating RN: Clover Mealy, RN, BSN, [employer] Primary Care Jamile Sivils: WHITE, Lanora Manis Other Clinician: Referring Carmon Sahli: WHITE, ELIZABETH Treating Caliana Spires/Extender: STONE III, HOYT Weeks in Treatment: 8 Vital Signs Height(in): 63 Pulse(bpm): 76 Weight(lbs): 187.3 Blood Pressure 128/70 (mmHg): Body Mass Index(BMI): 33 Temperature(F): 98.5 Respiratory Rate 18 (breaths/min): Photos: [1:No Photos] [N/A:N/A] Wound Location: [1:Right Ischium] [N/A:N/A] Wounding Event: [1:Surgical Injury] [N/A:N/A] Primary Etiology:  [1:Open Surgical Wound] [N/A:N/A] Comorbid History: [1:Chronic Obstructive Pulmonary Disease (COPD), Hypertension, Cirrhosis , Osteoarthritis] [N/A:N/A] Date Acquired: [1:12/15/2016] [N/A:N/A] Weeks of Treatment: [1:8] [N/A:N/A] Wound Status: [1:Open] [N/A:N/A] Measurements L x W x D 1x1.5x0.1 [N/A:N/A] (cm) Area (cm) : [1:1.178] [N/A:N/A] Volume (cm) : [1:0.118] [N/A:N/A] % Reduction in Area: [1:25.00%] [N/A:N/A] % Reduction in Volume: 87.50% [N/A:N/A] Classification: [1:Partial Thickness] [N/A:N/A] Exudate Amount: [1:Large] [N/A:N/A] Exudate Type: [1:Serosanguineous] [N/A:N/A] Exudate Color: [1:red, brown] [N/A:N/A] Wound Margin: [1:Distinct, outline attached] [N/A:N/A] Granulation Amount: [1:Large (67-100%)] [N/A:N/A] Granulation Quality: [1:Red] [N/A:N/A] Necrotic Amount: [1:Small (1-33%)] [N/A:N/A] Exposed Structures: [1:Fat Layer (Subcutaneous Tissue) Exposed: Yes Fascia: No Tendon: No Muscle: No] [  N/A:N/A] Joint: No Bone: No Epithelialization: None N/A N/A Periwound Skin Texture: Excoriation: No N/A N/A Induration: No Callus: No Crepitus: No Rash: No Scarring: No Periwound Skin Maceration: No N/A N/A Moisture: Dry/Scaly: No Periwound Skin Color: Atrophie Blanche: No N/A N/A Cyanosis: No Ecchymosis: No Erythema: No Hemosiderin Staining: No Mottled: No Pallor: No Rubor: No Temperature: No Abnormality N/A N/A Tenderness on Yes N/A N/A Palpation: Wound Preparation: Ulcer Cleansing: N/A N/A Rinsed/Irrigated with Saline Topical Anesthetic Applied: Other: lidocaine 4% Treatment Notes Electronic Signature(s) Signed: 02/25/2017 12:56:07 PM By: Elpidio Eric BSN, RN Entered By: Elpidio Eric on 02/25/2017 08:32:06 Hedeen, Nicki [name] ([id number]) -------------------------------------------------------------------------------- Multi-Disciplinary Care Plan Details Patient Name: Danielle Seen B. Date of Service: 02/25/2017 8:15 AM Medical Record Number:  [id number] Patient Account Number: [id number] Date of Birth/Sex: [birth year] (56 y.o. Female) Treating RN: Afful, RN, BSN, [employer] Primary Care Jemarcus Dougal: WHITE, Lanora Manis Other Clinician: Referring Aina Rossbach: WHITE, Lanora Manis Treating Sireen Halk/Extender: Linwood Dibbles, HOYT Weeks in Treatment: 8 Active Inactive ` Abuse / Safety / Falls / Self Care Management Nursing Diagnoses: Potential for falls Goals: Patient will remain injury free related to falls Date Initiated: 12/29/2016 Target Resolution Date: 04/18/2017 Goal Status: Active Interventions: Assess personal safety and home safety (as indicated) on admission and as needed Assess self care needs on admission and as needed Notes: ` Orientation to the Wound Care Program Nursing Diagnoses: Knowledge deficit related to the wound healing center program Goals: Patient/caregiver will verbalize understanding of the Wound Healing Center Program Date Initiated: 12/29/2016 Target Resolution Date: 01/17/2017 Goal Status: Active Interventions: Provide education on orientation to the wound center Notes: ` Pain, Acute or Chronic Nursing Diagnoses: Pain, acute or chronic: actual or potential Yun, Daine B. ([id number]) Potential alteration in comfort, pain Goals: Patient/caregiver will verbalize adequate pain control between visits Date Initiated: 12/29/2016 Target Resolution Date: 03/21/2017 Goal Status: Active Interventions: Assess comfort goal upon admission Complete pain assessment as per visit requirements Notes: ` Wound/Skin Impairment Nursing Diagnoses: Impaired tissue integrity Knowledge deficit related to smoking impact on wound healing Knowledge deficit related to ulceration/compromised skin integrity Goals: Ulcer/skin breakdown will have a volume reduction of 80% by week 12 Date Initiated: 12/29/2016 Target Resolution Date: 04/11/2017 Goal Status: Active Interventions: Assess patient/caregiver ability to perform ulcer/skin care  regimen upon admission and as needed Assess ulceration(s) every visit Notes: Electronic Signature(s) Signed: 02/25/2017 12:56:07 PM By: Elpidio Eric BSN, RN Entered By: Elpidio Eric on 02/25/2017 08:30:22 Pulver, Nicki [name] ([id number]) -------------------------------------------------------------------------------- Pain Assessment Details Patient Name: Danielle Seen B. Date of Service: 02/25/2017 8:15 AM Medical Record Number: [id number] Patient Account Number: [id number] Date of Birth/Sex: 1960-12-22 (56 y.o. Female) Treating RN: Afful, RN, BSN, [employer] Primary Care Kavonte Bearse: Doristine Mango Other Clinician: Referring Armon Orvis: WHITE, Lanora Manis Treating Ryo Klang/Extender: Linwood Dibbles, HOYT Weeks in Treatment: 8 Active Problems Location of Pain Severity and Description of Pain Patient Has Paino No Site Locations With Dressing Change: No Pain Management and Medication Current Pain Management: Electronic Signature(s) Signed: 02/25/2017 12:56:07 PM By: Elpidio Eric BSN, RN Entered By: Elpidio Eric on 02/25/2017 08:22:57 Zacarias, Nicki [name] ([id number]) -------------------------------------------------------------------------------- Patient/Caregiver Education Details Patient Name: Danielle Seen B. Date of Service: 02/25/2017 8:15 AM Medical Record Number: [id number] Patient Account Number: [id number] Date of Birth/Gender: 01-29-1961 (56 y.o. Female) Treating RN: Clover Mealy, RN, BSN, [employer] Primary Care Physician: Doristine Mango Other Clinician: Referring Physician: WHITE, Lanora Manis Treating Physician/Extender: Skeet Simmer in Treatment: 8 Education Assessment Education Provided To: Patient Education Topics Provided Welcome To The Wound Care Center: Methods: Explain/Verbal Responses: State  content correctly Wound Debridement: Methods: Explain/Verbal Responses: State content correctly Wound/Skin Impairment: Methods: Explain/Verbal Responses: State content correctly Electronic  Signature(s) Signed: 02/25/2017 12:56:07 PM By: Elpidio Eric BSN, RN Entered By: Elpidio Eric on 02/25/2017 08:50:48 Philipps, Nicki [name] ([id number]) -------------------------------------------------------------------------------- Wound Assessment Details Patient Name: Corle, Cortny B. Date of Service: 02/25/2017 8:15 AM Medical Record Number: [id number] Patient Account Number: [id number] Date of Birth/Sex: 1960/08/24 (56 y.o. Female) Treating RN: Afful, RN, BSN, [employer] Primary Care Hesper Venturella: Doristine Mango Other Clinician: Referring Keidan Aumiller: WHITE, ELIZABETH Treating Berish Bohman/Extender: STONE III, HOYT Weeks in Treatment: 8 Wound Status Wound Number: 1 Primary Open Surgical Wound Etiology: Wound Location: Right Ischium Wound Open Wounding Event: Surgical Injury Status: Date Acquired: 12/15/2016 Comorbid Chronic Obstructive Pulmonary Weeks Of Treatment: 8 History: Disease (COPD), Hypertension, Clustered Wound: No Cirrhosis , Osteoarthritis Photos Photo Uploaded By: Elliot Gurney, BSN, RN, CWS, Kim on 02/25/2017 13:59:45 Wound Measurements Length: (cm) 1.5 Width: (cm) 2 Depth: (cm) 11 Area: (cm) 2.356 Volume: (cm) 25.918 % Reduction in Area: -50% % Reduction in Volume: -2651.4% Epithelialization: None Tunneling: No Undermining: No Wound Description Classification: Partial Thickness Foul Odor Aft Wound Margin: Distinct, outline attached Slough/Fibrin Exudate Amount: Large Exudate Type: Serosanguineous Exudate Color: red, brown er Cleansing: No o No Wound Bed Granulation Amount: Large (67-100%) Exposed Structure Granulation Quality: Red Fascia Exposed: No Necrotic Amount: Small (1-33%) Fat Layer (Subcutaneous Tissue) Exposed: Yes Necrotic Quality: Adherent Slough Tendon Exposed: No Lafever, Solash B. ([id number]) Muscle Exposed: No Joint Exposed: No Bone Exposed: No Periwound Skin Texture Texture Color No Abnormalities Noted: No No Abnormalities Noted: No Callus:  No Atrophie Blanche: No Crepitus: No Cyanosis: No Excoriation: No Ecchymosis: No Induration: No Erythema: No Rash: No Hemosiderin Staining: No Scarring: No Mottled: No Pallor: No Moisture Rubor: No No Abnormalities Noted: No Dry / Scaly: No Temperature / Pain Maceration: No Temperature: No Abnormality Tenderness on Palpation: Yes Wound Preparation Ulcer Cleansing: Rinsed/Irrigated with Saline Topical Anesthetic Applied: Other: lidocaine 4%, Treatment Notes Wound #1 (Right Ischium) 1. Cleansed with: Clean wound with Normal Saline 3. Peri-wound Care: Skin Prep 4. Dressing Applied: Iodoform packing Gauze 5. Secondary Dressing Applied Bordered Foam Dressing Dry Gauze Electronic Signature(s) Signed: 02/25/2017 12:56:07 PM By: Elpidio Eric BSN, RN Entered By: Elpidio Eric on 02/25/2017 08:40:46 Jerrett, Nicki [name] ([id number]) -------------------------------------------------------------------------------- Vitals Details Patient Name: Danielle Seen B. Date of Service: 02/25/2017 8:15 AM Medical Record Number: [id number] Patient Account Number: [id number] Date of Birth/Sex: 1961/03/09 (56 y.o. Female) Treating RN: Afful, RN, BSN, [employer] Primary Care Ivannah Zody: WHITE, Lanora Manis Other Clinician: Referring Nikia Levels: WHITE, ELIZABETH Treating Meyli Boice/Extender: STONE III, HOYT Weeks in Treatment: 8 Vital Signs Time Taken: 08:22 Temperature (F): 98.5 Height (in): 63 Pulse (bpm): 76 Weight (lbs): 187.3 Respiratory Rate (breaths/min): 18 Body Mass Index (BMI): 33.2 Blood Pressure (mmHg): 128/70 Reference Range: 80 - 120 mg / dl Electronic Signature(s) Signed: 02/25/2017 12:56:07 PM By: Elpidio Eric BSN, RN Entered By: Elpidio Eric on 02/25/2017 08:25:01

## 2017-03-05 ENCOUNTER — Encounter: Payer: Medicare Other | Admitting: Surgery

## 2017-03-05 DIAGNOSIS — L97113 Non-pressure chronic ulcer of right thigh with necrosis of muscle: Secondary | ICD-10-CM | POA: Diagnosis not present

## 2017-03-08 NOTE — Progress Notes (Signed)
[name] ([id number]) Visit Report for 03/05/2017 Arrival Information Details Patient Name: [name], [name]. Date of Service: 03/05/2017 3:45 PM Medical Record Number: [id number] Patient Account Number: [id number] Date of Birth/Sex: [birth year] (56 y.o. Female) Treating RN: Ashok Cordia, Debi Primary Care Hilliary Jock: WHITE, Lanora Manis Other Clinician: Referring Alexy Heldt: WHITE, Lanora Manis Treating Selah Klang/Extender: Rudene Re in Treatment: 9 Visit Information History Since Last Visit All ordered tests and consults were completed: No Patient Arrived: Ambulatory Added or deleted any medications: No Arrival Time: 15:54 Any new allergies or adverse reactions: No Accompanied By: self Had a fall or experienced change in No Transfer Assistance: None activities of daily living that may affect Patient Identification Verified: Yes risk of falls: Secondary Verification Process Yes Signs or symptoms of abuse/neglect since last No Completed: visito Patient Requires Transmission-Based No Hospitalized since last visit: No Precautions: Has Dressing in Place as Prescribed: Yes Patient Has Alerts: No Pain Present Now: No Electronic Signature(s) Signed: 03/06/2017 4:07:04 PM By: Alejandro Mulling Entered By: Alejandro Mulling on 03/05/2017 15:54:19 Starner, Nicki [name] ([id number]) -------------------------------------------------------------------------------- Clinic Level of Care Assessment Details Patient Name: Danielle Seen B. Date of Service: 03/05/2017 3:45 PM Medical Record Number: [id number] Patient Account Number: [id number] Date of Birth/Sex: June 05, 1961 (56 y.o. Female) Treating RN: Ashok Cordia, Debi Primary Care Rupert Azzara: WHITE, Lanora Manis Other Clinician: Referring Sylvania Moss: WHITE, ELIZABETH Treating Damica Gravlin/Extender: Rudene Re in Treatment: 9 Clinic Level of Care Assessment Items TOOL 4 Quantity Score X - Use when only an EandM is performed on FOLLOW-UP visit 1  0 ASSESSMENTS - Nursing Assessment / Reassessment X - Reassessment of Co-morbidities (includes updates in patient status) 1 10 X - Reassessment of Adherence to Treatment Plan 1 5 ASSESSMENTS - Wound and Skin Assessment / Reassessment X - Simple Wound Assessment / Reassessment - one wound 1 5 []  - Complex Wound Assessment / Reassessment - multiple wounds 0 []  - Dermatologic / Skin Assessment (not related to wound area) 0 ASSESSMENTS - Focused Assessment []  - Circumferential Edema Measurements - multi extremities 0 []  - Nutritional Assessment / Counseling / Intervention 0 []  - Lower Extremity Assessment (monofilament, tuning fork, pulses) 0 []  - Peripheral Arterial Disease Assessment (using hand held doppler) 0 ASSESSMENTS - Ostomy and/or Continence Assessment and Care []  - Incontinence Assessment and Management 0 []  - Ostomy Care Assessment and Management (repouching, etc.) 0 PROCESS - Coordination of Care X - Simple Patient / Family Education for ongoing care 1 15 []  - Complex (extensive) Patient / Family Education for ongoing care 0 []  - Staff obtains Chiropractor, Records, Test Results / Process Orders 0 []  - Staff telephones HHA, Nursing Homes / Clarify orders / etc 0 []  - Routine Transfer to another Facility (non-emergent condition) 0 Mcevers, Naiomy B. ([id number]) []  - Routine Hospital Admission (non-emergent condition) 0 []  - New Admissions / Manufacturing engineer / Ordering NPWT, Apligraf, etc. 0 []  - Emergency Hospital Admission (emergent condition) 0 X - Simple Discharge Coordination 1 10 []  - Complex (extensive) Discharge Coordination 0 PROCESS - Special Needs []  - Pediatric / Minor Patient Management 0 []  - Isolation Patient Management 0 []  - Hearing / Language / Visual special needs 0 []  - Assessment of Community assistance (transportation, D/C planning, etc.) 0 []  - Additional assistance / Altered mentation 0 []  - Support Surface(s) Assessment (bed, cushion, seat, etc.)  0 INTERVENTIONS - Wound Cleansing / Measurement X - Simple Wound Cleansing - one wound 1 5 []  - Complex Wound Cleansing - multiple wounds 0 X - Wound Imaging (photographs -  any number of wounds) 1 5 []  - Wound Tracing (instead of photographs) 0 X - Simple Wound Measurement - one wound 1 5 []  - Complex Wound Measurement - multiple wounds 0 INTERVENTIONS - Wound Dressings X - Small Wound Dressing one or multiple wounds 1 10 []  - Medium Wound Dressing one or multiple wounds 0 []  - Large Wound Dressing one or multiple wounds 0 X - Application of Medications - topical 1 5 []  - Application of Medications - injection 0 INTERVENTIONS - Miscellaneous []  - External ear exam 0 [name], [name] ([id number]) []  - Specimen Collection (cultures, biopsies, blood, body fluids, etc.) 0 []  - Specimen(s) / Culture(s) sent or taken to Lab for analysis 0 []  - Patient Transfer (multiple staff / Michiel Sites Lift / Similar devices) 0 []  - Simple Staple / Suture removal (25 or less) 0 []  - Complex Staple / Suture removal (26 or more) 0 []  - Hypo / Hyperglycemic Management (close monitor of Blood Glucose) 0 []  - Ankle / Brachial Index (ABI) - do not check if billed separately 0 X - Vital Signs 1 5 Has the patient been seen at the hospital within the last three years: Yes Total Score: 80 Level Of Care: New/Established - Level 3 Electronic Signature(s) Signed: 03/06/2017 4:07:04 PM By: Alejandro Mulling Entered By: Alejandro Mulling on 03/05/2017 17:03:06 Jacobsen, Nicki [name] ([id number]) -------------------------------------------------------------------------------- Encounter Discharge Information Details Patient Name: Danielle Seen B. Date of Service: 03/05/2017 3:45 PM Medical Record Number: [id number] Patient Account Number: [id number] Date of Birth/Sex: 29-Sep-1960 (56 y.o. Female) Treating RN: Ashok Cordia, Debi Primary Care Alaisha Eversley: WHITE, Lanora Manis Other Clinician: Referring Cortnee Steinmiller: WHITE, Lanora Manis Treating  Quintarius Ferns/Extender: Rudene Re in Treatment: 9 Encounter Discharge Information Items Discharge Pain Level: 0 Discharge Condition: Stable Ambulatory Status: Ambulatory Discharge Destination: Home Transportation: Private Auto Accompanied By: self Schedule Follow-up Appointment: Yes Medication Reconciliation completed and provided to Patient/Care No Pearle Wandler: Provided on Clinical Summary of Care: 03/05/2017 Form Type Recipient Paper Patient JW Electronic Signature(s) Signed: 03/06/2017 4:07:04 PM By: Alejandro Mulling Entered By: Alejandro Mulling on 03/05/2017 17:04:10 Bracy, Nicki [name] ([id number]) -------------------------------------------------------------------------------- Lower Extremity Assessment Details Patient Name: Sheils, Emillia B. Date of Service: 03/05/2017 3:45 PM Medical Record Number: [id number] Patient Account Number: [id number] Date of Birth/Sex: 1961-02-28 (56 y.o. Female) Treating RN: Ashok Cordia, Debi Primary Care Brodyn Depuy: Doristine Mango Other Clinician: Referring Derrious Bologna: WHITE, Lanora Manis Treating Zebediah Beezley/Extender: Rudene Re in Treatment: 9 Electronic Signature(s) Signed: 03/06/2017 4:07:04 PM By: Alejandro Mulling Entered By: Alejandro Mulling on 03/05/2017 16:26:06 Ledgerwood, Nicki [name] ([id number]) -------------------------------------------------------------------------------- Multi Wound Chart Details Patient Name: Minkoff, Quisha B. Date of Service: 03/05/2017 3:45 PM Medical Record Number: [id number] Patient Account Number: [id number] Date of Birth/Sex: [birth year] (56 y.o. Female) Treating RN: Ashok Cordia, Debi Primary Care Sharyl Panchal: WHITE, Lanora Manis Other Clinician: Referring Sherah Lund: WHITE, Lanora Manis Treating Kadelyn Dimascio/Extender: Rudene Re in Treatment: 9 Vital Signs Height(in): 63 Pulse(bpm): 77 Weight(lbs): 187.3 Blood Pressure 159/79 (mmHg): Body Mass Index(BMI): 33 Temperature(F): 98.1 Respiratory  Rate 18 (breaths/min): Photos: [1:No Photos] [N/A:N/A] Wound Location: [1:Right Ischium] [N/A:N/A] Wounding Event: [1:Surgical Injury] [N/A:N/A] Primary Etiology: [1:Open Surgical Wound] [N/A:N/A] Comorbid History: [1:Chronic Obstructive Pulmonary Disease (COPD), Hypertension, Cirrhosis , Osteoarthritis] [N/A:N/A] Date Acquired: [1:12/15/2016] [N/A:N/A] Weeks of Treatment: [1:9] [N/A:N/A] Wound Status: [1:Open] [N/A:N/A] Measurements L x W x D 0.5x0.8x1.8 [N/A:N/A] (cm) Area (cm) : [1:0.314] [N/A:N/A] Volume (cm) : [1:0.565] [N/A:N/A] % Reduction in Area: [1:80.00%] [N/A:N/A] % Reduction in Volume: 40.00% [N/A:N/A] Classification: [1:Partial Thickness] [N/A:N/A] Exudate Amount: [1:Large] [N/A:N/A] Exudate Type: [1:Serosanguineous] [N/A:N/A] Exudate Color: [1:red,  brown] [N/A:N/A] Wound Margin: [1:Distinct, outline attached] [N/A:N/A] Granulation Amount: [1:Large (67-100%)] [N/A:N/A] Granulation Quality: [1:Red] [N/A:N/A] Necrotic Amount: [1:Small (1-33%)] [N/A:N/A] Exposed Structures: [1:Fat Layer (Subcutaneous Tissue) Exposed: Yes Fascia: No Tendon: No Muscle: No] [N/A:N/A] Joint: No Bone: No Epithelialization: None N/A N/A Periwound Skin Texture: Excoriation: No N/A N/A Induration: No Callus: No Crepitus: No Rash: No Scarring: No Periwound Skin Maceration: No N/A N/A Moisture: Dry/Scaly: No Periwound Skin Color: Atrophie Blanche: No N/A N/A Cyanosis: No Ecchymosis: No Erythema: No Hemosiderin Staining: No Mottled: No Pallor: No Rubor: No Temperature: No Abnormality N/A N/A Tenderness on Yes N/A N/A Palpation: Wound Preparation: Ulcer Cleansing: N/A N/A Rinsed/Irrigated with Saline Topical Anesthetic Applied: Other: lidocaine 4% Treatment Notes Electronic Signature(s) Signed: 03/05/2017 5:05:00 PM By: Evlyn Kanner MD, FACS Entered By: Evlyn Kanner on 03/05/2017 16:33:14 Aja, Nicki [name]  ([id number]) -------------------------------------------------------------------------------- Multi-Disciplinary Care Plan Details Patient Name: Danielle Seen B. Date of Service: 03/05/2017 3:45 PM Medical Record Number: [id number] Patient Account Number: [id number] Date of Birth/Sex: 03/17/1961 (56 y.o. Female) Treating RN: Ashok Cordia, Debi Primary Care Margurete Guaman: WHITE, Lanora Manis Other Clinician: Referring Rhina Kramme: WHITE, Lanora Manis Treating Trevar Boehringer/Extender: Rudene Re in Treatment: 9 Active Inactive ` Abuse / Safety / Falls / Self Care Management Nursing Diagnoses: Potential for falls Goals: Patient will remain injury free related to falls Date Initiated: 12/29/2016 Target Resolution Date: 04/18/2017 Goal Status: Active Interventions: Assess personal safety and home safety (as indicated) on admission and as needed Assess self care needs on admission and as needed Notes: ` Orientation to the Wound Care Program Nursing Diagnoses: Knowledge deficit related to the wound healing center program Goals: Patient/caregiver will verbalize understanding of the Wound Healing Center Program Date Initiated: 12/29/2016 Target Resolution Date: 01/17/2017 Goal Status: Active Interventions: Provide education on orientation to the wound center Notes: ` Pain, Acute or Chronic Nursing Diagnoses: Pain, acute or chronic: actual or potential Marmo, Tyshae B. ([id number]) Potential alteration in comfort, pain Goals: Patient/caregiver will verbalize adequate pain control between visits Date Initiated: 12/29/2016 Target Resolution Date: 03/21/2017 Goal Status: Active Interventions: Assess comfort goal upon admission Complete pain assessment as per visit requirements Notes: ` Wound/Skin Impairment Nursing Diagnoses: Impaired tissue integrity Knowledge deficit related to smoking impact on wound healing Knowledge deficit related to ulceration/compromised skin  integrity Goals: Ulcer/skin breakdown will have a volume reduction of 80% by week 12 Date Initiated: 12/29/2016 Target Resolution Date: 04/11/2017 Goal Status: Active Interventions: Assess patient/caregiver ability to perform ulcer/skin care regimen upon admission and as needed Assess ulceration(s) every visit Notes: Electronic Signature(s) Signed: 03/06/2017 4:07:04 PM By: Alejandro Mulling Entered By: Alejandro Mulling on 03/05/2017 16:26:11 Magana, Nicki [name] ([id number]) -------------------------------------------------------------------------------- Pain Assessment Details Patient Name: Danielle Seen B. Date of Service: 03/05/2017 3:45 PM Medical Record Number: [id number] Patient Account Number: [id number] Date of Birth/Sex: 02/02/1961 (56 y.o. Female) Treating RN: Ashok Cordia, Debi Primary Care Shivonne Schwartzman: WHITE, Lanora Manis Other Clinician: Referring Elvis Boot: WHITE, Lanora Manis Treating Marleta Lapierre/Extender: Rudene Re in Treatment: 9 Active Problems Location of Pain Severity and Description of Pain Patient Has Paino No Site Locations Pain Management and Medication Current Pain Management: Electronic Signature(s) Signed: 03/06/2017 4:07:04 PM By: Alejandro Mulling Entered By: Alejandro Mulling on 03/05/2017 15:54:26 Cressey, Nicki [name] ([id number]) -------------------------------------------------------------------------------- Patient/Caregiver Education Details Patient Name: Danielle Seen B. Date of Service: 03/05/2017 3:45 PM Medical Record Number: [id number] Patient Account Number: [id number] Date of Birth/Gender: June 23, 1961 (56 y.o. Female) Treating RN: Ashok Cordia, Debi Primary Care Physician: WHITE, Lanora Manis Other Clinician: Referring Physician: WHITE, Lanora Manis Treating Physician/Extender: Rudene Re in Treatment: 9  Education Assessment Education Provided To: Patient Education Topics Provided Wound/Skin Impairment: Handouts: Other: change dressing as  ordered Methods: Demonstration, Explain/Verbal Responses: State content correctly Electronic Signature(s) Signed: 03/06/2017 4:07:04 PM By: Alejandro Mulling Entered By: Alejandro Mulling on 03/05/2017 17:04:21 Wenzlick, Nicki [name] ([id number]) -------------------------------------------------------------------------------- Wound Assessment Details Patient Name: Pallone, Netty B. Date of Service: 03/05/2017 3:45 PM Medical Record Number: [id number] Patient Account Number: [id number] Date of Birth/Sex: 06-Jun-1961 (56 y.o. Female) Treating RN: Ashok Cordia, Debi Primary Care Cheyne Boulden: WHITE, Lanora Manis Other Clinician: Referring Deondray Ospina: WHITE, Lanora Manis Treating Breann Losano/Extender: Rudene Re in Treatment: 9 Wound Status Wound Number: 1 Primary Open Surgical Wound Etiology: Wound Location: Right Ischium Wound Open Wounding Event: Surgical Injury Status: Date Acquired: 12/15/2016 Comorbid Chronic Obstructive Pulmonary Weeks Of Treatment: 9 History: Disease (COPD), Hypertension, Clustered Wound: No Cirrhosis , Osteoarthritis Photos Photo Uploaded By: Alejandro Mulling on 03/05/2017 17:41:59 Wound Measurements Length: (cm) 0.5 Width: (cm) 0.8 Depth: (cm) 1.8 Area: (cm) 0.314 Volume: (cm) 0.565 % Reduction in Area: 80% % Reduction in Volume: 40% Epithelialization: None Tunneling: No Undermining: No Wound Description Classification: Partial Thickness Foul Odor Aft Wound Margin: Distinct, outline attached Slough/Fibrin Exudate Amount: Large Exudate Type: Serosanguineous Exudate Color: red, brown er Cleansing: No o No Wound Bed Granulation Amount: Large (67-100%) Exposed Structure Granulation Quality: Red Fascia Exposed: No Necrotic Amount: Small (1-33%) Fat Layer (Subcutaneous Tissue) Exposed: Yes Necrotic Quality: Adherent Slough Tendon Exposed: No Delancy, Tamber B. ([id number]) Muscle Exposed: No Joint Exposed: No Bone Exposed: No Periwound Skin Texture Texture  Color No Abnormalities Noted: No No Abnormalities Noted: No Callus: No Atrophie Blanche: No Crepitus: No Cyanosis: No Excoriation: No Ecchymosis: No Induration: No Erythema: No Rash: No Hemosiderin Staining: No Scarring: No Mottled: No Pallor: No Moisture Rubor: No No Abnormalities Noted: No Dry / Scaly: No Temperature / Pain Maceration: No Temperature: No Abnormality Tenderness on Palpation: Yes Wound Preparation Ulcer Cleansing: Rinsed/Irrigated with Saline Topical Anesthetic Applied: Other: lidocaine 4%, Treatment Notes Wound #1 (Right Ischium) 1. Cleansed with: Clean wound with Normal Saline 2. Anesthetic Topical Lidocaine 4% cream to wound bed prior to debridement 3. Peri-wound Care: Skin Prep 4. Dressing Applied: Other dressing (specify in notes) 5. Secondary Dressing Applied Dry Gauze [place] Notes [place] Electronic Signature(s) Signed: 03/06/2017 4:07:04 PM By: Alejandro Mulling Entered By: Alejandro Mulling on 03/05/2017 15:59:23 Lemme, Nicki [name] ([id number]) -------------------------------------------------------------------------------- Vitals Details Patient Name: Danielle Seen B. Date of Service: 03/05/2017 3:45 PM Medical Record Number: [id number] Patient Account Number: [id number] Date of Birth/Sex: [birth year] (56 y.o. Female) Treating RN: Ashok Cordia, Debi Primary Care Kenroy Timberman: WHITE, Lanora Manis Other Clinician: Referring Breon Rehm: WHITE, Lanora Manis Treating Jaziah Goeller/Extender: Rudene Re in Treatment: 9 Vital Signs Time Taken: 15:54 Temperature (F): 98.1 Height (in): 63 Pulse (bpm): 77 Weight (lbs): 187.3 Respiratory Rate (breaths/min): 18 Body Mass Index (BMI): 33.2 Blood Pressure (mmHg): 159/79 Reference Range: 80 - 120 mg / dl Electronic Signature(s) Signed: 03/06/2017 4:07:04 PM By: Alejandro Mulling Entered By: Alejandro Mulling on 03/05/2017 15:54:48

## 2017-03-08 NOTE — Progress Notes (Signed)
[name] ([id number]) Visit Report for 03/05/2017 Chief Complaint Document Details Patient Name: [name]. Date of Service: 03/05/2017 3:45 PM Medical Record Number: [id number] Patient Account Number: [id number] Date of Birth/Sex: 06/02/1961 (56 y.o. Female) Treating RN: Ashok Cordia, Debi Primary Care Provider: WHITE, Lanora Manis Other Clinician: Referring Provider: WHITE, Lanora Manis Treating Provider/Extender: Rudene Re in Treatment: 9 Information Obtained from: Patient Chief Complaint Patient presents to the wound care center with open non-healing surgical wound to the right upper thigh Electronic Signature(s) Signed: 03/05/2017 5:05:00 PM By: Evlyn Kanner MD, FACS Entered By: Evlyn Kanner on 03/05/2017 16:33:21 [name] ([id number]) -------------------------------------------------------------------------------- HPI Details Patient Name: Danielle Seen B. Date of Service: 03/05/2017 3:45 PM Medical Record Number: [id number] Patient Account Number: [id number] Date of Birth/Sex: 07-07-1961 (56 y.o. Female) Treating RN: Ashok Cordia, Debi Primary Care Provider: WHITE, Lanora Manis Other Clinician: Referring Provider: WHITE, Lanora Manis Treating Provider/Extender: Rudene Re in Treatment: 9 History of Present Illness Location: right upper thigh Quality: Patient reports experiencing a dull pain to affected area(s). Severity: Patient states wound are getting better Duration: Patient has had the wound for < 6 weeks prior to presenting for treatment Timing: Pain in wound is Intermittent (comes and goes Context: The wound occurred when the patient had incision and drainage of a hematoma of the right thigh Modifying Factors: Other treatment(s) tried include:washing with dilute hydrogen peroxide Associated Signs and Symptoms: Patient reports having increase discharge. HPI Description: 56 year old female who was admitted to the hospital on 524 and kept  overnight for a right hip hematoma which an incision and drainage was done in the OR. inpatient she was treated with IV antibiotics( vancomycin and Ancef) and discharged home on Bactrim DS. the patient has several comorbidities including a former smoker who quit in January of this year and was also a heavy drinker in the past. past medical history significant for COPD, cirrhosis of the liver, arthritis, tobacco abuse and alcohol abuse. 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 The patient has given up cigarettes in January of this year but smokes E cigarette since then. She has given up a heavy alcohol intake for the last 7 years 01/05/17 patient's right lateral hip wound appears to be doing fairly well on evaluation today. She shows no signs or evidence of significant infection and she has been tolerating the dressing changes without complication. She continues to have some depth and undermining in regard to her wound. Today we are going to initiate the SNAP Vac for her. 01/19/17 Patient appears to be doing well with current treatment. She has no evidence of infection currently and is tolerating the SNAP VAC very well. 01/26/17 on evaluation today patient appears to be doing well with the current wound VAC. With that being said the whole has gotten to the point that we are really no longer able to pack this with the foam for the vac. For that reason it appears that we are going to have to do something different going forward today. Fortunately there is no immediate discomfort in the region of her wound and there's no evidence of infection. 02/22/2017 -- fluid from right groin collection showed no growth in 2 days 02/25/17 on evaluation today patient is seen in regard to a reopening of her right hip wound. Unfortunately she had some hyper granular tissue noted and they go back to [facility] for evaluation orthopedics and they are recommending essentially further  evaluation. With that being said she states in the interim  she figured that she would come back for evaluation here to see if there's anything that we could do to help. She has no [name], [name] ([id number]) peer doing discharge noted although she states that it does seem that there some white tissue and the wound bed. No fevers, chills, nausea, or vomiting noted at this time. 03/05/2017 -- the patient is still awaiting an orthopedic opinion for definitive surgery and in the meantime would like to come to [place] for taking care of this wound on the right upper groin area which continues to be draining fluid. Electronic Signature(s) Signed: 03/05/2017 5:05:00 PM By: Evlyn Kanner MD, FACS Entered By: Evlyn Kanner on 03/05/2017 16:34:05 [name] ([id number]) -------------------------------------------------------------------------------- Physical Exam Details Patient Name: [name], [name] Date of Service: 03/05/2017 3:45 PM Medical Record Number: [id number] Patient Account Number: [id number] Date of Birth/Sex: 01/26/1961 (56 y.o. Female) Treating RN: Ashok Cordia, Debi Primary Care Provider: WHITE, Lanora Manis Other Clinician: Referring Provider: WHITE, Lanora Manis Treating Provider/Extender: Rudene Re in Treatment: 9 Constitutional . Pulse regular. Respirations normal and unlabored. Afebrile. . Eyes Nonicteric. Reactive to light. Ears, Nose, Mouth, and Throat Lips, teeth, and gums WNL.Marland Kitchen Moist mucosa without lesions. Neck supple and nontender. No palpable supraclavicular or cervical adenopathy. Normal sized without goiter. Respiratory WNL. No retractions.. Cardiovascular Pedal Pulses WNL. No clubbing, cyanosis or edema. Lymphatic No adneopathy. No adenopathy. No adenopathy. Musculoskeletal Adexa without tenderness or enlargement.. Digits and nails w/o clubbing, cyanosis, infection, petechiae, ischemia, or inflammatory conditions.. Integumentary (Hair, Skin) No  suspicious lesions. No crepitus or fluctuance. No peri-wound warmth or erythema. No masses.Marland Kitchen Psychiatric Judgement and insight Intact.. No evidence of depression, anxiety, or agitation.. Notes this is a sinus track just below the right groin crease which needs to be kept open with some silver alginate packing so that this does not become an abscess or the area gets cellulitis. At the present time it looks clean and there is no surrounding cellulitis. Electronic Signature(s) Signed: 03/05/2017 5:05:00 PM By: Evlyn Kanner MD, FACS Entered By: Evlyn Kanner on 03/05/2017 16:34:56 Barros, [name] ([id number]) -------------------------------------------------------------------------------- Physician Orders Details Patient Name: Danielle Seen B. Date of Service: 03/05/2017 3:45 PM Medical Record Number: [id number] Patient Account Number: [id number] Date of Birth/Sex: March 14, 1961 (56 y.o. Female) Treating RN: Ashok Cordia, Debi Primary Care Provider: WHITE, Lanora Manis Other Clinician: Referring Provider: WHITE, Lanora Manis Treating Provider/Extender: Rudene Re in Treatment: 9 Verbal / Phone Orders: No Diagnosis Coding Wound Cleansing Wound #1 Right Ischium o Cleanse wound with mild soap and water Anesthetic Wound #1 Right Ischium o Topical Lidocaine 4% cream applied to wound bed prior to debridement Primary Wound Dressing Wound #1 Right Ischium o Other: - Silvercel Secondary Dressing Wound #1 Right Ischium o Dry Gauze o Boardered Foam Dressing Dressing Change Frequency Wound #1 Right Ischium o Change dressing every day. Follow-up Appointments Wound #1 Right Ischium o Return Appointment in 2 weeks. Additional Orders / Instructions Wound #1 Right Ischium o Stop Smoking o Increase protein intake. Electronic Signature(s) Signed: 03/05/2017 5:05:00 PM By: Evlyn Kanner MD, FACS Signed: 03/06/2017 4:07:04 PM By: Teodora Medici  ([id number]) Entered By: Alejandro Mulling on 03/05/2017 16:37:02 Pilling, [name] ([id number]) -------------------------------------------------------------------------------- Problem List Details Patient Name: Wesely, [name] Date of Service: 03/05/2017 3:45 PM Medical Record Number: [id number] Patient Account Number: [id number] Date of Birth/Sex: 06-24-1961 (56 y.o. 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 T81.31XA Disruption of external operation (surgical) wound, not 12/29/2016 Yes elsewhere classified, initial encounter 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 dependence, cigarettes, with other nicotine- 12/29/2016 Yes induced disorders L02.214 Cutaneous abscess of groin 02/12/2017 Yes Inactive Problems Resolved Problems Electronic Signature(s) Signed: 03/05/2017 5:05:00 PM By: Evlyn Kanner MD, FACS Entered By: Evlyn Kanner on 03/05/2017 16:33:09 Helfand, [name] ([id number]) -------------------------------------------------------------------------------- Progress Note Details Patient Name: Michels, [name] Date of Service: 03/05/2017 3:45 PM Medical Record Number: [id number] Patient Account Number: [id number] Date of Birth/Sex: [birth year] (56 y.o. Female) Treating RN: Ashok Cordia, Debi Primary Care Provider: WHITE, Lanora Manis Other Clinician: Referring Provider: WHITE, Lanora Manis Treating Provider/Extender: Rudene Re in Treatment: 9 Subjective Chief Complaint Information obtained from Patient Patient presents to the wound care center with open non-healing surgical wound to the right upper thigh History of Present Illness (HPI) The following HPI elements were documented for the  patient's wound: Location: right upper thigh Quality: Patient reports experiencing a dull pain to affected area(s). Severity: Patient states wound are getting better Duration: Patient has had the wound for < 6 weeks prior to presenting for treatment Timing: Pain in wound is Intermittent (comes and goes Context: The wound occurred when the patient had incision and drainage of a hematoma of the right thigh Modifying Factors: Other treatment(s) tried include:washing with dilute hydrogen peroxide Associated Signs and Symptoms: Patient reports having increase discharge. 56 year old female who was admitted to the hospital on 524 and kept overnight for a right hip hematoma which an incision and drainage was done in the OR. inpatient she was treated with IV antibiotics ( vancomycin and Ancef) and discharged home on Bactrim DS. the patient has several comorbidities including a former smoker who quit in January of this year and was also a heavy drinker in the past. past medical history significant for COPD, cirrhosis of the liver, arthritis, tobacco abuse and alcohol abuse. 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 The patient has given up cigarettes in January of this year but smokes E cigarette since then. She has given up a heavy alcohol intake for the last 7 years 01/05/17 patient's right lateral hip wound appears to be doing fairly well on evaluation today. She shows no signs or evidence of significant infection and she has been tolerating the dressing changes without complication. She continues to have some depth and undermining in regard to her wound. Today we are going to initiate the SNAP Vac for her. 01/19/17 Patient appears to be doing well with current treatment. She has no evidence of infection currently and is tolerating the SNAP VAC very well. 01/26/17 on evaluation today patient appears to be doing well with the current wound VAC. With that  being said the whole has gotten to the point that we are really no longer able to pack this with the foam for the vac. For that reason it appears that we are going to have to do something different going forward today. Fortunately there is no immediate discomfort in the region of her wound and there's no evidence of infection. LASHAUNDA, SCHILD ([id number]) 02/22/2017 -- fluid from right groin collection showed no growth in 2 days 02/25/17 on evaluation today patient is seen in regard to a reopening of her right hip wound. Unfortunately she had some hyper granular tissue noted and they go back to [facility] for evaluation orthopedics and they are recommending essentially further  evaluation. With that being said she states in the interim she figured that she would come back for evaluation here to see if there's anything that we could do to help. She has no peer doing discharge noted although she states that it does seem that there some white tissue and the wound bed. No fevers, chills, nausea, or vomiting noted at this time. 03/05/2017 -- the patient is still awaiting an orthopedic opinion for definitive surgery and in the meantime would like to come to [place] for taking care of this wound on the right upper groin area which continues to be draining fluid. Objective Constitutional Pulse regular. Respirations normal and unlabored. Afebrile. Vitals Time Taken: 3:54 PM, Height: 63 in, Weight: 187.3 lbs, BMI: 33.2, Temperature: 98.1 F, Pulse: 77 bpm, Respiratory Rate: 18 breaths/min, Blood Pressure: 159/79 mmHg. Eyes Nonicteric. Reactive to light. Ears, Nose, Mouth, and Throat Lips, teeth, and gums WNL.Marland Kitchen Moist mucosa without lesions. Neck supple and nontender. No palpable supraclavicular or cervical adenopathy. Normal sized without goiter. Respiratory WNL. No retractions.. Cardiovascular Pedal Pulses WNL. No clubbing, cyanosis or edema. Lymphatic No adneopathy. No adenopathy. No  adenopathy. Musculoskeletal Adexa without tenderness or enlargement.. Digits and nails w/o clubbing, cyanosis, infection, petechiae, Cisek, [name] ([id number]) ischemia, or inflammatory conditions.Marland Kitchen Psychiatric Judgement and insight Intact.. No evidence of depression, anxiety, or agitation.. General Notes: this is a sinus track just below the right groin crease which needs to be kept open with some silver alginate packing so that this does not become an abscess or the area gets cellulitis. At the present time it looks clean and there is no surrounding cellulitis. Integumentary (Hair, Skin) No suspicious lesions. No crepitus or fluctuance. No peri-wound warmth or erythema. No masses.. Wound #1 status is Open. Original cause of wound was Surgical Injury. The wound is located on the Right Ischium. The wound measures 0.5cm length x 0.8cm width x 1.8cm depth; 0.314cm^2 area and 0.565cm^3 volume. There is Fat Layer (Subcutaneous Tissue) Exposed exposed. There is no tunneling or undermining noted. There is a large amount of serosanguineous drainage noted. The wound margin is distinct with the outline attached to the wound base. There is large (67-100%) red granulation within the wound bed. There is a small (1-33%) amount of necrotic tissue within the wound bed including Adherent Slough. The periwound skin appearance did not exhibit: Callus, Crepitus, Excoriation, Induration, Rash, Scarring, Dry/Scaly, Maceration, Atrophie Blanche, Cyanosis, Ecchymosis, Hemosiderin Staining, Mottled, Pallor, Rubor, Erythema. Periwound temperature was noted as No Abnormality. The periwound has tenderness on palpation. 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 ([id number]) Primary Wound Dressing: Wound #1 Right Ischium: Other: - Silvercel Secondary Dressing: Wound #1 Right Ischium: Dry Gauze Boardered Foam Dressing Dressing Change Frequency: Wound #1 Right Ischium: Change dressing every day. Follow-up Appointments: Wound #1 Right Ischium: Return Appointment in 2 weeks. Additional Orders / Instructions: Wound #1 Right Ischium: Stop Smoking Increase protein intake. This is a vexing problem for the patient and she is here for a help to tide over the period of time before she gets her orthopedic procedure done. I have recommended  packing the wound lightly with silver alginate rope and covering it with a bordered foam. She should change this daily or every other day and see [place] back if she gets to her orthopedic surgeon. She does understand that eventually she is going to need revision surgery by the orthopedic surgeon. Electronic Signature(s) Signed: 03/05/2017 5:34:55 PM By: Evlyn Kanner MD, FACS Previous Signature: 03/05/2017 5:05:00 PM Version By: Evlyn Kanner MD, FACS Entered By: Evlyn Kanner on 03/05/2017 17:34:25 Trantham, [name] ([id number]) -------------------------------------------------------------------------------- SuperBill Details Patient Name: Danielle Seen B. Date of Service: 03/05/2017 Medical Record Number: [id number] Patient Account Number: [id number] Date of Birth/Sex: [birth year] (56 y.o. Female) Treating RN: Ashok Cordia, Debi Primary Care Provider: WHITE, Lanora Manis Other Clinician: Referring Provider: WHITE, Lanora Manis Treating Provider/Extender: Rudene Re in Treatment: 9 Diagnosis Coding ICD-10 Codes Code Description L97.113 Non-pressure chronic  ulcer of right thigh with necrosis of muscle Disruption of external operation (surgical) wound, not elsewhere classified, initial T81.31XA encounter 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 dependence, cigarettes, with other nicotine-induced disorders L02.214 Cutaneous abscess of groin Facility Procedures CPT4 Code: [id number] Description: 99213 - WOUND CARE VISIT-LEV 3 EST PT Modifier: Quantity: 1 Physician Procedures CPT4: Description Modifier Quantity Code [id number] 99213 - WC PHYS LEVEL 3 - EST PT 1 ICD-10 Description Diagnosis 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

## 2017-03-19 ENCOUNTER — Ambulatory Visit: Payer: Medicare Other | Admitting: Surgery

## 2017-03-23 ENCOUNTER — Encounter: Payer: Medicare Other | Attending: Surgery | Admitting: Surgery

## 2017-03-23 DIAGNOSIS — I10 Essential (primary) hypertension: Secondary | ICD-10-CM | POA: Insufficient documentation

## 2017-03-23 DIAGNOSIS — T8131XA Disruption of external operation (surgical) wound, not elsewhere classified, initial encounter: Secondary | ICD-10-CM | POA: Insufficient documentation

## 2017-03-23 DIAGNOSIS — L97113 Non-pressure chronic ulcer of right thigh with necrosis of muscle: Secondary | ICD-10-CM | POA: Insufficient documentation

## 2017-03-23 DIAGNOSIS — F17218 Nicotine dependence, cigarettes, with other nicotine-induced disorders: Secondary | ICD-10-CM | POA: Diagnosis not present

## 2017-03-23 DIAGNOSIS — Y839 Surgical procedure, unspecified as the cause of abnormal reaction of the patient, or of later complication, without mention of misadventure at the time of the procedure: Secondary | ICD-10-CM | POA: Insufficient documentation

## 2017-03-23 DIAGNOSIS — J449 Chronic obstructive pulmonary disease, unspecified: Secondary | ICD-10-CM | POA: Diagnosis not present

## 2017-03-23 DIAGNOSIS — K7460 Unspecified cirrhosis of liver: Secondary | ICD-10-CM | POA: Diagnosis not present

## 2017-03-23 DIAGNOSIS — M1990 Unspecified osteoarthritis, unspecified site: Secondary | ICD-10-CM | POA: Insufficient documentation

## 2017-03-23 DIAGNOSIS — F1010 Alcohol abuse, uncomplicated: Secondary | ICD-10-CM | POA: Insufficient documentation

## 2017-03-24 NOTE — Progress Notes (Addendum)
ELMA, SHANDS ([id number]) Visit Report for 03/23/2017 Arrival Information Details Patient Name: Danielle, [name]. Date of Service: 03/23/2017 9:45 AM Medical Record Number: [id number] Patient Account Number: [id number] Date of Birth/Sex: 12/20/1960 (56 y.o. Female) Treating RN: Ashok Cordia, Debi Primary Care Adelaine Roppolo: WHITE, Lanora Manis Other Clinician: Referring Jalessa Peyser: WHITE, Lanora Manis Treating Jane Birkel/Extender: Rudene Re in Treatment: 12 Visit Information History Since Last Visit All ordered tests and consults were completed: No Patient Arrived: Ambulatory Added or deleted any medications: No Arrival Time: 09:49 Any new allergies or adverse reactions: No Accompanied By: self Had a fall or experienced change in No Transfer Assistance: None activities of daily living that may affect Patient Identification Verified: Yes risk of falls: Secondary Verification Process Completed: Yes Signs or symptoms of abuse/neglect since last visito No Patient Requires Transmission-Based No Hospitalized since last visit: No Precautions: Has Dressing in Place as Prescribed: Yes Patient Has Alerts: No Pain Present Now: No Electronic Signature(s) Signed: 03/23/2017 4:52:43 PM By: Alejandro Mulling Entered By: Alejandro Mulling on 03/23/2017 09:50:02 Kuk, [name] ([id number]) -------------------------------------------------------------------------------- Clinic Level of Care Assessment Details Patient Name: Danielle Seen B. Date of Service: 03/23/2017 9:45 AM Medical Record Number: [id number] Patient Account Number: [id number] Date of Birth/Sex: [birth year] (56 y.o. Female) Treating RN: Ashok Cordia, Debi Primary Care Jermarion Poffenberger: WHITE, Lanora Manis Other Clinician: Referring Habiba Treloar: WHITE, Lanora Manis Treating Farha Dano/Extender: Rudene Re in Treatment: 12 Clinic Level of Care Assessment Items TOOL 4 Quantity Score X - Use when only an EandM is performed on FOLLOW-UP visit 1  0 ASSESSMENTS - Nursing Assessment / Reassessment X - Reassessment of Co-morbidities (includes updates in patient status) 1 10 X- 1 5 Reassessment of Adherence to Treatment Plan ASSESSMENTS - Wound and Skin Assessment / Reassessment X - Simple Wound Assessment / Reassessment - one wound 1 5  - 0 Complex Wound Assessment / Reassessment - multiple wounds  - 0 Dermatologic / Skin Assessment (not related to wound area) ASSESSMENTS - Focused Assessment  - Circumferential Edema Measurements - multi extremities 0  - 0 Nutritional Assessment / Counseling / Intervention  - 0 Lower Extremity Assessment (monofilament, tuning fork, pulses)  - 0 Peripheral Arterial Disease Assessment (using hand held doppler) ASSESSMENTS - Ostomy and/or Continence Assessment and Care  - Incontinence Assessment and Management 0  - 0 Ostomy Care Assessment and Management (repouching, etc.) PROCESS - Coordination of Care X - Simple Patient / Family Education for ongoing care 1 15  - 0 Complex (extensive) Patient / Family Education for ongoing care  - 0 Staff obtains Chiropractor, Records, Test Results / Process Orders  - 0 Staff telephones HHA, Nursing Homes / Clarify orders / etc  - 0 Routine Transfer to another Facility (non-emergent condition)  - 0 Routine Hospital Admission (non-emergent condition)  - 0 New Admissions / Manufacturing engineer / Ordering NPWT, Apligraf, etc.  - 0 Emergency Hospital Admission (emergent condition) X- 1 10 Simple Discharge Coordination Harrow, Lailynn B. ([id number])  - 0 Complex (extensive) Discharge Coordination PROCESS - Special Needs  - Pediatric / Minor Patient Management 0  - 0 Isolation Patient Management  - 0 Hearing / Language / Visual special needs  - 0 Assessment of Community assistance (transportation, D/C planning, etc.)  - 0 Additional assistance / Altered mentation  - 0 Support Surface(s) Assessment (bed,  cushion, seat, etc.) INTERVENTIONS - Wound Cleansing / Measurement X - Simple Wound Cleansing - one wound 1 5  - 0 Complex Wound Cleansing - multiple wounds X- 1 5 Wound Imaging (photographs - any  number of wounds)  - 0 Wound Tracing (instead of photographs) X- 1 5 Simple Wound Measurement - one wound  - 0 Complex Wound Measurement - multiple wounds INTERVENTIONS - Wound Dressings X - Small Wound Dressing one or multiple wounds 1 10  - 0 Medium Wound Dressing one or multiple wounds  - 0 Large Wound Dressing one or multiple wounds X- 1 5 Application of Medications - topical  - 0 Application of Medications - injection INTERVENTIONS - Miscellaneous  - External ear exam 0  - 0 Specimen Collection (cultures, biopsies, blood, body fluids, etc.)  - 0 Specimen(s) / Culture(s) sent or taken to Lab for analysis  - 0 Patient Transfer (multiple staff / Nurse, adult / Similar devices)  - 0 Simple Staple / Suture removal (25 or less)  - 0 Complex Staple / Suture removal (26 or more)  - 0 Hypo / Hyperglycemic Management (close monitor of Blood Glucose)  - 0 Ankle / Brachial Index (ABI) - do not check if billed separately X- 1 5 Vital Signs [name], [name] ([id number]) Has the patient been seen at the hospital within the last three years: Yes Total Score: 80 Level Of Care: New/Established - Level 3 Electronic Signature(s) Signed: 03/23/2017 4:52:43 PM By: Alejandro Mulling Entered By: Alejandro Mulling on 03/23/2017 10:13:26 Overbaugh, [name] ([id number]) -------------------------------------------------------------------------------- Encounter Discharge Information Details Patient Name: Danielle Seen B. Date of Service: 03/23/2017 9:45 AM Medical Record Number: [id number] Patient Account Number: [id number] Date of Birth/Sex: October 29, 1960 (56 y.o. Female) Treating RN: Ashok Cordia, Debi Primary Care Shanell Aden: WHITE, Lanora Manis Other Clinician: Referring  Ruthvik Barnaby: WHITE, Lanora Manis Treating Josephus Harriger/Extender: Rudene Re in Treatment: 12 Encounter Discharge Information Items Discharge Pain Level: 0 Discharge Condition: Stable Ambulatory Status: Ambulatory Discharge Destination: Home Transportation: Private Auto Accompanied By: self Schedule Follow-up Appointment: Yes Medication Reconciliation completed and No provided to Patient/Care Zhyon Antenucci: Provided on Clinical Summary of Care: 03/23/2017 Form Type Recipient Paper Patient JW Electronic Signature(s) Signed: 03/24/2017 8:13:46 AM By: Gwenlyn Perking Entered By: Gwenlyn Perking on 03/23/2017 10:10:51 [name], [name] ([id number]) -------------------------------------------------------------------------------- Lower Extremity Assessment Details Patient Name: [name], [name] Date of Service: 03/23/2017 9:45 AM Medical Record Number: [id number] Patient Account Number: [id number] Date of Birth/Sex: 07-Feb-1961 (56 y.o. Female) Treating RN: Ashok Cordia, Debi Primary Care Renel Ende: Doristine Mango Other Clinician: Referring Lamar Meter: WHITE, Lanora Manis Treating Breelyn Icard/Extender: Rudene Re in Treatment: 12 Electronic Signature(s) Signed: 03/23/2017 4:52:43 PM By: Alejandro Mulling Entered By: Alejandro Mulling on 03/23/2017 09:58:43 Bathe, [name] ([id number]) -------------------------------------------------------------------------------- Multi Wound Chart Details Patient Name: [name], [name] Date of Service: 03/23/2017 9:45 AM Medical Record Number: [id number] Patient Account Number: [id number] Date of Birth/Sex: 1960-07-30 (56 y.o. Female) Treating RN: Ashok Cordia, Debi Primary Care Megham Dwyer: WHITE, Lanora Manis Other Clinician: Referring Archibald Marchetta: WHITE, Lanora Manis Treating Carron Mcmurry/Extender: Rudene Re in Treatment: 12 Vital Signs Height(in): 63 Pulse(bpm): 77 Weight(lbs): 187.3 Blood Pressure(mmHg): 143/63 Body Mass Index(BMI): 33 Temperature(F):  98.0 Respiratory Rate 18 (breaths/min): Photos: [1:No Photos] [N/A:N/A] Wound Location: [1:Right Ischium] [N/A:N/A] Wounding Event: [1:Surgical Injury] [N/A:N/A] Primary Etiology: [1:Open Surgical Wound] [N/A:N/A] Comorbid History: [1:Chronic Obstructive Pulmonary Disease (COPD), Hypertension, Cirrhosis , Osteoarthritis] [N/A:N/A] Date Acquired: [1:12/15/2016] [N/A:N/A] Weeks of Treatment: [1:12] [N/A:N/A] Wound Status: [1:Open] [N/A:N/A] Measurements L x W x D [1:0.5x0.8x0.8] [N/A:N/A] (cm) Area (cm) : [1:0.314] [N/A:N/A] Volume (cm) : [1:0.251] [N/A:N/A] % Reduction in Area: [1:80.00%] [N/A:N/A] % Reduction in Volume: [1:73.40%] [N/A:N/A] Starting Position 1 [1:10] (o'clock): Ending Position 1 [1:6] (o'clock): Maximum Distance 1 (cm): [1:0.4] Undermining: [1:Yes] [N/A:N/A] Classification: [1:Partial Thickness] [N/A:N/A]  Exudate Amount: [1:Large] [N/A:N/A] Exudate Type: [1:Serous] [N/A:N/A] Exudate Color: [1:amber] [N/A:N/A] Wound Margin: [1:Distinct, outline attached] [N/A:N/A] Granulation Amount: [1:Large (67-100%)] [N/A:N/A] Granulation Quality: [1:Red] [N/A:N/A] Necrotic Amount: [1:Small (1-33%)] [N/A:N/A] Exposed Structures: [1:Fat Layer (Subcutaneous Tissue) Exposed: Yes Fascia: No Tendon: No Muscle: No] [N/A:N/A] Joint: No Bone: No Epithelialization: None N/A N/A Periwound Skin Texture: Excoriation: No N/A N/A Induration: No Callus: No Crepitus: No Rash: No Scarring: No Periwound Skin Moisture: Maceration: No N/A N/A Dry/Scaly: No Periwound Skin Color: Atrophie Blanche: No N/A N/A Cyanosis: No Ecchymosis: No Erythema: No Hemosiderin Staining: No Mottled: No Pallor: No Rubor: No Temperature: No Abnormality N/A N/A Tenderness on Palpation: Yes N/A N/A Wound Preparation: Ulcer Cleansing: N/A N/A Rinsed/Irrigated with Saline Topical Anesthetic Applied: Other: lidocaine 4% Treatment Notes Wound #1 (Right Ischium) 1. Cleansed with: Clean wound  with Normal Saline 2. Anesthetic Topical Lidocaine 4% cream to wound bed prior to debridement 3. Peri-wound Care: Skin Prep 4. Dressing Applied: Other dressing (specify in notes) 5. Secondary Dressing Applied Bordered Foam Dressing Dry Gauze Notes [place] Electronic Signature(s) Signed: 03/23/2017 4:10:50 PM By: Evlyn Kanner MD, FACS Entered By: Evlyn Kanner on 03/23/2017 10:08:17 [name] ([id number]) -------------------------------------------------------------------------------- Multi-Disciplinary Care Plan Details Patient Name: Danielle Seen B. Date of Service: 03/23/2017 9:45 AM Medical Record Number: [id number] Patient Account Number: [id number] Date of Birth/Sex: 1961-02-06 (56 y.o. Female) Treating RN: Phillis Haggis Primary Care Nairi Oswald: Doristine Mango Other Clinician: Referring Brilee Port: WHITE, Lanora Manis Treating Klohe Lovering/Extender: Rudene Re in Treatment: 12 Active Inactive Electronic Signature(s) Signed: 06/10/2017 10:06:17 AM By: Alejandro Mulling Previous Signature: 04/09/2017 1:00:07 PM Version By: Elliot Gurney BSN, RN, CWS, Kim RN, BSN Previous Signature: 03/23/2017 4:52:43 PM Version By: Alejandro Mulling Entered By: Alejandro Mulling on 06/10/2017 10:06:16 Kisner, [name] ([id number]) -------------------------------------------------------------------------------- Pain Assessment Details Patient Name: [name], [name] Date of Service: 03/23/2017 9:45 AM Medical Record Number: [id number] Patient Account Number: [id number] Date of Birth/Sex: 1960/10/02 (56 y.o. Female) Treating RN: Ashok Cordia, Debi Primary Care Candis Kabel: WHITE, Lanora Manis Other Clinician: Referring Gabrien Mentink: WHITE, Lanora Manis Treating Fumiye Lubben/Extender: Rudene Re in Treatment: 12 Active Problems Location of Pain Severity and Description of Pain Patient Has Paino No Site Locations Pain Management and Medication Current Pain Management: Electronic Signature(s) Signed:  03/23/2017 4:52:43 PM By: Alejandro Mulling Entered By: Alejandro Mulling on 03/23/2017 09:50:09 Hew, [name] ([id number]) -------------------------------------------------------------------------------- Patient/Caregiver Education Details Patient Name: Danielle Seen B. Date of Service: 03/23/2017 9:45 AM Medical Record Number: [id number] Patient Account Number: [id number] Date of Birth/Gender: Jul 14, 1961 (56 y.o. Female) Treating RN: Ashok Cordia, Debi Primary Care Physician: WHITE, Lanora Manis Other Clinician: Referring Physician: WHITE, Lanora Manis Treating Physician/Extender: Rudene Re in Treatment: 12 Education Assessment Education Provided To: Patient Education Topics Provided Wound/Skin Impairment: Handouts: Other: change dressing as ordered Methods: Demonstration, Explain/Verbal Responses: State content correctly Electronic Signature(s) Signed: 03/23/2017 4:52:43 PM By: Alejandro Mulling Entered By: Alejandro Mulling on 03/23/2017 10:02:21 Vana, [name] ([id number]) -------------------------------------------------------------------------------- Wound Assessment Details Patient Name: [name], [name] Date of Service: 03/23/2017 9:45 AM Medical Record Number: [id number] Patient Account Number: [id number] Date of Birth/Sex: Dec 10, 1960 (56 y.o. Female) Treating RN: Ashok Cordia, Debi Primary Care Grizel Vesely: WHITE, Lanora Manis Other Clinician: Referring Deundra Bard: WHITE, Lanora Manis Treating Kullen Tomasetti/Extender: Rudene Re in Treatment: 12 Wound Status Wound Number: 1 Primary Open Surgical Wound Etiology: Wound Location: Right Ischium Wound Open Wounding Event: Surgical Injury Status: Date Acquired: 12/15/2016 Comorbid Chronic Obstructive Pulmonary Disease Weeks Of Treatment: 12 History: (COPD), Hypertension, Cirrhosis , Clustered Wound: No Osteoarthritis Photos Photo Uploaded By: Alejandro Mulling on 03/23/2017 16:06:30  Wound Measurements Length: (cm) 0.5 Width:  (cm) 0.8 Depth: (cm) 0.8 Area: (cm) 0.314 Volume: (cm) 0.251 % Reduction in Area: 80% % Reduction in Volume: 73.4% Epithelialization: None Tunneling: No Undermining: Yes Starting Position (o'clock): 10 Ending Position (o'clock): 6 Maximum Distance: (cm) 0.4 Wound Description Classification: Partial Thickness Wound Margin: Distinct, outline attached Exudate Amount: Large Exudate Type: Serous Exudate Color: amber Foul Odor After Cleansing: No Slough/Fibrino No Wound Bed Granulation Amount: Large (67-100%) Exposed Structure Granulation Quality: Red Fascia Exposed: No Necrotic Amount: Small (1-33%) Fat Layer (Subcutaneous Tissue) Exposed: Yes Necrotic Quality: Adherent Slough Tendon Exposed: No Muscle Exposed: No [name], [name] ([id number]) Joint Exposed: No Bone Exposed: No Periwound Skin Texture Texture Color No Abnormalities Noted: No No Abnormalities Noted: No Callus: No Atrophie Blanche: No Crepitus: No Cyanosis: No Excoriation: No Ecchymosis: No Induration: No Erythema: No Rash: No Hemosiderin Staining: No Scarring: No Mottled: No Pallor: No Moisture Rubor: No No Abnormalities Noted: No Dry / Scaly: No Temperature / Pain Maceration: No Temperature: No Abnormality Tenderness on Palpation: Yes Wound Preparation Ulcer Cleansing: Rinsed/Irrigated with Saline Topical Anesthetic Applied: Other: lidocaine 4%, Electronic Signature(s) Signed: 03/23/2017 4:52:43 PM By: Alejandro MullingPinkerton, Debra Entered By: Alejandro MullingPinkerton, Debra on 03/23/2017 09:57:58 [name], [name] ([id number]) -------------------------------------------------------------------------------- Vitals Details Patient Name: Danielle SeenWORKMAN, [name] Date of Service: 03/23/2017 9:45 AM Medical Record Number: [id number] Patient Account Number: [id number] Date of Birth/Sex: [birth year] (56 y.o. Female) Treating RN: Ashok CordiaPinkerton, Debi Primary Care Jenell Dobransky: WHITE, Lanora ManisELIZABETH Other Clinician: Referring Littleton Haub: WHITE,  Lanora ManisELIZABETH Treating Pegge Cumberledge/Extender: Rudene ReBritto, Errol Weeks in Treatment: 12 Vital Signs Time Taken: 09:50 Temperature (F): 98.0 Height (in): 63 Pulse (bpm): 77 Weight (lbs): 187.3 Respiratory Rate (breaths/min): 18 Body Mass Index (BMI): 33.2 Blood Pressure (mmHg): 143/63 Reference Range: 80 - 120 mg / dl Electronic Signature(s) Signed: 03/23/2017 4:52:43 PM By: Alejandro MullingPinkerton, Debra Entered By: Alejandro MullingPinkerton, Debra on 03/23/2017 09:51:48

## 2017-03-24 NOTE — Progress Notes (Signed)
Danielle RochesterWORKMAN, Estoria B. ([id number]) Visit Report for 03/23/2017 Chief Complaint Document Details Patient Name: [name], [name] Date of Service: 03/23/2017 9:45 AM Medical Record Number: [id number] Patient Account Number: [id number] Date of Birth/Sex: Oct 15, 1960 (56 y.o. Female) Treating RN: Ashok CordiaPinkerton, Debi Primary Care Provider: WHITE, Lanora ManisELIZABETH Other Clinician: Referring Provider: WHITE, Lanora ManisELIZABETH Treating Provider/Extender: Rudene ReBritto, Lariza Cothron Weeks in Treatment: 12 Information Obtained from: Patient Chief Complaint Patient presents to the wound care center with open non-healing surgical wound to the right upper thigh Electronic Signature(s) Signed: 03/23/2017 4:10:50 PM By: Evlyn KannerBritto, Zalman Hull MD, FACS Entered By: Evlyn KannerBritto, Laren Whaling on 03/23/2017 10:08:26 Cotugno, Nicki GuadalajaraJANET B. ([id number]) -------------------------------------------------------------------------------- HPI Details Patient Name: [name], [name] Date of Service: 03/23/2017 9:45 AM Medical Record Number: [id number] Patient Account Number: [id number] Date of Birth/Sex: Oct 15, 1960 (56 y.o. Female) Treating RN: Ashok CordiaPinkerton, Debi Primary Care Provider: WHITE, Lanora ManisELIZABETH Other Clinician: Referring Provider: WHITE, Lanora ManisELIZABETH Treating Provider/Extender: Rudene ReBritto, Isamu Trammel Weeks in Treatment: 12 History of Present Illness Location: right upper thigh Quality: Patient reports experiencing a dull pain to affected area(s). Severity: Patient states wound are getting better Duration: Patient has had the wound for < 6 weeks prior to presenting for treatment Timing: Pain in wound is Intermittent (comes and goes Context: The wound occurred when the patient had incision and drainage of a hematoma of the right thigh Modifying Factors: Other treatment(s) tried include:washing with dilute hydrogen peroxide Associated Signs and Symptoms: Patient reports having increase discharge. HPI Description: 56 year old female who was admitted to the hospital on 524 and kept  overnight for a right hip hematoma which an incision and drainage was done in the OR. inpatient she was treated with IV antibiotics( vancomycin and Ancef) and discharged home on Bactrim DS. the patient has several comorbidities including a former smoker who quit in January of this year and was also a heavy drinker in the past. past medical history significant for COPD, cirrhosis of the liver, arthritis, tobacco abuse and alcohol abuse. 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 The patient has given up cigarettes in January of this year but smokes E cigarette since then. She has given up a heavy alcohol intake for the last 7 years 01/05/17 patient's right lateral hip wound appears to be doing fairly well on evaluation today. She shows no signs or evidence of significant infection and she has been tolerating the dressing changes without complication. She continues to have some depth and undermining in regard to her wound. Today we are going to initiate the SNAP Vac for her. 01/19/17 Patient appears to be doing well with current treatment. She has no evidence of infection currently and is tolerating the SNAP VAC very well. 01/26/17 on evaluation today patient appears to be doing well with the current wound VAC. With that being said the whole has gotten to the point that we are really no longer able to pack this with the foam for the vac. For that reason it appears that we are going to have to do something different going forward today. Fortunately there is no immediate discomfort in the region of her wound and there's no evidence of infection. 02/22/2017 -- fluid from right groin collection showed no growth in 2 days 02/25/17 on evaluation today patient is seen in regard to a reopening of her right hip wound. Unfortunately she had some hyper granular tissue noted and [facility] and they are recommending essentially further  evaluation. With that being said she states in the interim  she figured that she would come back for evaluation here to see if there's anything that we could do to help. She has no Graf, Jasma B. ([id number]) peer doing discharge noted although she states that it does seem that there some white tissue and the wound bed. No fevers, chills, nausea, or vomiting noted at this time. 03/05/2017 -- the patient is still awaiting an orthopedic opinion for definitive surgery and in the meantime would like to come to [place] for taking care of this wound on the right upper groin area which continues to be draining fluid. 03/23/2017 -- the patient has been scheduled for orthopedic surgery in 2 weeks time. In the last 10 days she's had a lot of problems with a urinary tract infection and has been on 2 separate antibiotics and is feeling much better now. Electronic Signature(s) Signed: 03/23/2017 4:10:50 PM By: Evlyn Kanner MD, FACS Entered By: Evlyn Kanner on 03/23/2017 10:09:05 [name] ([id number]) -------------------------------------------------------------------------------- Physical Exam Details Patient Name: Vonstein, [name] Date of Service: 03/23/2017 9:45 AM Medical Record Number: [id number] Patient Account Number: [id number] Date of Birth/Sex: [birth year] (56 y.o. Female) Treating RN: Ashok Cordia, Debi Primary Care Provider: WHITE, Lanora Manis Other Clinician: Referring Provider: WHITE, Lanora Manis Treating Provider/Extender: Rudene Re in Treatment: 12 Constitutional . Pulse regular. Respirations normal and unlabored. Afebrile. . Eyes Nonicteric. Reactive to light. Ears, Nose, Mouth, and Throat Lips, teeth, and gums WNL.Marland Kitchen Moist mucosa without lesions. Neck supple and nontender. No palpable supraclavicular or cervical adenopathy. Normal sized without goiter. Respiratory WNL. No retractions.. Cardiovascular Pedal Pulses WNL. No clubbing, cyanosis or edema. Lymphatic No  adneopathy. No adenopathy. No adenopathy. Musculoskeletal Adexa without tenderness or enlargement.. Digits and nails w/o clubbing, cyanosis, infection, petechiae, ischemia, or inflammatory conditions.. Integumentary (Hair, Skin) No suspicious lesions. No crepitus or fluctuance. No peri-wound warmth or erythema. No masses.Marland Kitchen Psychiatric Judgement and insight Intact.. No evidence of depression, anxiety, or agitation.. Notes the sinus tract goes medially for about half a centimeter but other than that there is no evidence of cellulitis or significant purulent drainage. No sharp debridement was required today. Electronic Signature(s) Signed: 03/23/2017 4:10:50 PM By: Evlyn Kanner MD, FACS Entered By: Evlyn Kanner on 03/23/2017 10:09:43 Wimbish, Nicki [name] ([id number]) -------------------------------------------------------------------------------- Physician Orders Details Patient Name: Danielle Seen B. Date of Service: 03/23/2017 9:45 AM Medical Record Number: [id number] Patient Account Number: [id number] Date of Birth/Sex: Jan 27, 1961 (56 y.o. Female) Treating RN: Ashok Cordia, Debi Primary Care Provider: WHITE, Lanora Manis Other Clinician: Referring Provider: WHITE, Lanora Manis Treating Provider/Extender: Rudene Re in Treatment: 12 Verbal / Phone Orders: Yes Clinician: Ashok Cordia, Debi Read Back and Verified: Yes Diagnosis Coding Wound Cleansing Wound #1 Right Ischium o Cleanse wound with mild soap and water Anesthetic Wound #1 Right Ischium o Topical Lidocaine 4% cream applied to wound bed prior to debridement Primary Wound Dressing Wound #1 Right Ischium o Other: - Silvercel Secondary Dressing Wound #1 Right Ischium o Dry Gauze o Boardered Foam Dressing Dressing Change Frequency Wound #1 Right Ischium o Change dressing every day. Follow-up Appointments Wound #1 Right Ischium o Return Appointment in 2 weeks. Additional Orders / Instructions Wound #1 Right  Ischium o Stop Smoking o Increase protein intake. Electronic Signature(s) Signed: 03/23/2017 4:10:50 PM By: Evlyn Kanner MD, FACS Signed: 03/23/2017 4:52:43 PM By: Teodora Medici ([id number]) Entered By: Alejandro Mulling on 03/23/2017 10:02:47 Galbraith, Nicki [name] ([id number]) -------------------------------------------------------------------------------- Problem List Details Patient Name: Shelburne, Jamilyn B. Date of Service: 03/23/2017 9:45 AM Medical Record Number: [id number] Patient Account Number: [id number]  Date of Birth/Sex: 1960/10/11 (56 y.o. 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 T81.31XA Disruption of external operation (surgical) wound, not 12/29/2016 Yes elsewhere classified, initial encounter 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 dependence, cigarettes, with other nicotine- 12/29/2016 Yes induced disorders Inactive Problems Resolved Problems ICD-10 Code Description Active Date Resolved Date L02.214 Cutaneous abscess of groin 02/12/2017 02/12/2017 Electronic Signature(s) Signed: 03/23/2017 10:08:10 AM By: Evlyn Kanner MD, FACS Entered By: Evlyn Kanner on 03/23/2017 10:08:10 Rickles, Nicki [name] ([id number]) -------------------------------------------------------------------------------- Progress Note Details Patient Name: Pereyra, Dulcinea B. Date of Service: 03/23/2017 9:45 AM Medical Record Number: [id number] Patient Account Number: [id number] Date of Birth/Sex: 1961/03/29 (56 y.o. Female) Treating RN: Ashok Cordia, Debi Primary Care Provider: WHITE, Lanora Manis Other Clinician: Referring Provider: WHITE, Lanora Manis Treating Provider/Extender: Rudene Re in  Treatment: 12 Subjective Chief Complaint Information obtained from Patient Patient presents to the wound care center with open non-healing surgical wound to the right upper thigh History of Present Illness (HPI) The following HPI elements were documented for the patient's wound: Location: right upper thigh Quality: Patient reports experiencing a dull pain to affected area(s). Severity: Patient states wound are getting better Duration: Patient has had the wound for < 6 weeks prior to presenting for treatment Timing: Pain in wound is Intermittent (comes and goes Context: The wound occurred when the patient had incision and drainage of a hematoma of the right thigh Modifying Factors: Other treatment(s) tried include:washing with dilute hydrogen peroxide Associated Signs and Symptoms: Patient reports having increase discharge. 56 year old female who was admitted to the hospital on 524 and kept overnight for a right hip hematoma which an incision and drainage was done in the OR. inpatient she was treated with IV antibiotics ( vancomycin and Ancef) and discharged home on Bactrim DS. the patient has several comorbidities including a former smoker who quit in January of this year and was also a heavy drinker in the past. past medical history significant for COPD, cirrhosis of the liver, arthritis, tobacco abuse and alcohol abuse. 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 The patient has given up cigarettes in January of this year but smokes E cigarette since then. She has given up a heavy alcohol intake for the last 7 years 01/05/17 patient's right lateral hip wound appears to be doing fairly well on evaluation today. She shows no signs or evidence of significant infection and she has been tolerating the dressing changes without complication. She continues to have some depth and undermining in regard to her wound. Today we are going to initiate the SNAP Vac  for her. 01/19/17 Patient appears to be doing well with current treatment. She has no evidence of infection currently and is tolerating the SNAP VAC very well. 01/26/17 on evaluation today patient appears to be doing well with the current wound VAC. With that being said the whole has gotten to the point that we are really no longer able to pack this with the foam for the vac. For that reason it appears that we are going to have to do something different going forward today. Fortunately there is no immediate discomfort in the region of her wound and there's no evidence of infection. YOLTZIN, RANSOM ([id number]) 02/22/2017 -- fluid from right groin collection showed no growth in 2 days  02/25/17 on evaluation today patient is seen in regard to a reopening of her right hip wound. Unfortunately she had some hyper granular tissue noted and they go back to [facility] for evaluation orthopedics and they are recommending essentially further evaluation. With that being said she states in the interim she figured that she would come back for evaluation here to see if there's anything that we could do to help. She has no peer doing discharge noted although she states that it does seem that there some white tissue and the wound bed. No fevers, chills, nausea, or vomiting noted at this time. 03/05/2017 -- the patient is still awaiting an orthopedic opinion for definitive surgery and in the meantime would like to come to [place] for taking care of this wound on the right upper groin area which continues to be draining fluid. 03/23/2017 -- the patient has been scheduled for orthopedic surgery in 2 weeks time. In the last 10 days she's had a lot of problems with a urinary tract infection and has been on 2 separate antibiotics and is feeling much better now. Objective Constitutional Pulse regular. Respirations normal and unlabored. Afebrile. Vitals Time Taken: 9:50 AM, Height: 63 in, Weight: 187.3 lbs, BMI: 33.2,  Temperature: 98.0 F, Pulse: 77 bpm, Respiratory Rate: 18 breaths/min, Blood Pressure: 143/63 mmHg. Eyes Nonicteric. Reactive to light. Ears, Nose, Mouth, and Throat Lips, teeth, and gums WNL.Marland Kitchen Moist mucosa without lesions. Neck supple and nontender. No palpable supraclavicular or cervical adenopathy. Normal sized without goiter. Respiratory WNL. No retractions.. Cardiovascular Pedal Pulses WNL. No clubbing, cyanosis or edema. Hinely, Catilyn B. ([id number]) Lymphatic No adneopathy. No adenopathy. No adenopathy. Musculoskeletal Adexa without tenderness or enlargement.. Digits and nails w/o clubbing, cyanosis, infection, petechiae, ischemia, or inflammatory conditions.Marland Kitchen Psychiatric Judgement and insight Intact.. No evidence of depression, anxiety, or agitation.. General Notes: the sinus tract goes medially for about half a centimeter but other than that there is no evidence of cellulitis or significant purulent drainage. No sharp debridement was required today. Integumentary (Hair, Skin) No suspicious lesions. No crepitus or fluctuance. No peri-wound warmth or erythema. No masses.. Wound #1 status is Open. Original cause of wound was Surgical Injury. The wound is located on the Right Ischium. The wound measures 0.5cm length x 0.8cm width x 0.8cm depth; 0.314cm^2 area and 0.251cm^3 volume. There is Fat Layer (Subcutaneous Tissue) Exposed exposed. There is no tunneling noted, however, there is undermining starting at 10:00 and ending at 6:00 with a maximum distance of 0.4cm. There is a large amount of serous drainage noted. The wound margin is distinct with the outline attached to the wound base. There is large (67-100%) red granulation within the wound bed. There is a small (1-33%) amount of necrotic tissue within the wound bed including Adherent Slough. The periwound skin appearance did not exhibit: Callus, Crepitus, Excoriation, Induration, Rash, Scarring, Dry/Scaly, Maceration,  Atrophie Blanche, Cyanosis, Ecchymosis, Hemosiderin Staining, Mottled, Pallor, Rubor, Erythema. Periwound temperature was noted as No Abnormality. The periwound has tenderness on palpation. 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 ([id number]) Anesthetic: Wound #1 Right Ischium: Topical Lidocaine 4% cream applied to wound bed prior to debridement Primary Wound Dressing: Wound #1 Right Ischium: Other: - Silvercel Secondary Dressing: Wound #1  Right Ischium: Dry Gauze Boardered Foam Dressing Dressing Change Frequency: Wound #1 Right Ischium: Change dressing every day. Follow-up Appointments: Wound #1 Right Ischium: Return Appointment in 2 weeks. Additional Orders / Instructions: Wound #1 Right Ischium: Stop Smoking Increase protein intake. she will continue with her oral antibiotics as per her PCP regarding her UTI. I have recommended packing the wound lightly with silver alginate rope and covering it with a bordered foam. She should change this daily or every other day and see [place] back if she gets to her orthopedic surgeon. She does understand that eventually after her revision surgery by the orthopedic surgeon, she should return only if the need arises Electronic Signature(s) Signed: 03/23/2017 4:10:50 PM By: Evlyn Kanner MD, FACS Entered By: Evlyn Kanner on 03/23/2017 10:11:04 Brensinger, Nicki [name] ([id number]) -------------------------------------------------------------------------------- SuperBill Details Patient Name: Danielle Seen B. Date of Service: 03/23/2017 Medical Record Number: [id number] Patient Account Number:  [id number] Date of Birth/Sex: [birth year] (56 y.o. 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 Disruption of external operation (surgical) wound, not elsewhere classified, initial T81.31XA encounter 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 dependence, cigarettes, with other nicotine-induced disorders Facility Procedures CPT4 Code: 08657846 Description: 99213 - WOUND CARE VISIT-LEV 3 EST PT Modifier: Quantity: 1 Physician Procedures CPT4: Description Modifier Quantity Code [id number] 99213 - WC PHYS LEVEL 3 - EST PT 1 ICD-10 Description Diagnosis 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

## 2017-06-23 ENCOUNTER — Other Ambulatory Visit
Admission: RE | Admit: 2017-06-23 | Discharge: 2017-06-23 | Disposition: A | Discharge status: Home / Self Care | Payer: Medicare Other | Source: Ambulatory Visit | Attending: Family Medicine | Admitting: Family Medicine

## 2017-06-23 DIAGNOSIS — M869 Osteomyelitis, unspecified: Secondary | ICD-10-CM | POA: Diagnosis present

## 2017-06-23 LAB — VANCOMYCIN, TROUGH: Vancomycin Tr: 19 ug/mL (ref 15–20)

## 2017-06-26 LAB — CBC WITH DIFFERENTIAL/PLATELET
BASOS PCT: 1 %
Basophils Absolute: 0 10*3/uL (ref 0–0.1)
EOS PCT: 3 %
Eosinophils Absolute: 0.1 10*3/uL (ref 0–0.7)
HEMATOCRIT: 20.4 % — AB (ref 35.0–47.0)
Hemoglobin: 6.5 g/dL — ABNORMAL LOW (ref 12.0–16.0)
Lymphocytes Relative: 15 %
Lymphs Abs: 0.8 10*3/uL — ABNORMAL LOW (ref 1.0–3.6)
MCH: 28.9 pg (ref 26.0–34.0)
MCHC: 31.7 g/dL — AB (ref 32.0–36.0)
MCV: 91.3 fL (ref 80.0–100.0)
MONO ABS: 0.4 10*3/uL (ref 0.2–0.9)
MONOS PCT: 8 %
NEUTROS ABS: 3.9 10*3/uL (ref 1.4–6.5)
Neutrophils Relative %: 73 %
PLATELETS: 191 10*3/uL (ref 150–440)
RBC: 2.23 MIL/uL — ABNORMAL LOW (ref 3.80–5.20)
RDW: 20.9 % — AB (ref 11.5–14.5)
WBC: 5.3 10*3/uL (ref 3.6–11.0)

## 2017-06-26 LAB — HEPATIC FUNCTION PANEL
ALBUMIN: 3 g/dL — AB (ref 3.5–5.0)
ALT: 17 U/L (ref 14–54)
AST: 30 U/L (ref 15–41)
Alkaline Phosphatase: 92 U/L (ref 38–126)
BILIRUBIN DIRECT: 0.3 mg/dL (ref 0.1–0.5)
BILIRUBIN TOTAL: 1.1 mg/dL (ref 0.3–1.2)
Indirect Bilirubin: 0.8 mg/dL (ref 0.3–0.9)
Total Protein: 5.9 g/dL — ABNORMAL LOW (ref 6.5–8.1)

## 2017-06-26 LAB — CREATININE, SERUM
CREATININE: 0.65 mg/dL (ref 0.44–1.00)
GFR calc Af Amer: >60 mL/min (ref >60)
GFR calc non Af Amer: >60 mL/min (ref >60)

## 2017-06-26 LAB — VANCOMYCIN, TROUGH: VANCOMYCIN TR: 19 ug/mL (ref 15–20)

## 2018-02-15 ENCOUNTER — Ambulatory Visit: Payer: Medicare Other | Admitting: Radiation Oncology

## 2018-03-08 ENCOUNTER — Ambulatory Visit
Admission: RE | Admit: 2018-03-08 | Discharge: 2018-03-08 | Disposition: A | Discharge status: Home / Self Care | Payer: Medicare Other | Source: Ambulatory Visit | Attending: Radiation Oncology | Admitting: Radiation Oncology

## 2018-03-08 ENCOUNTER — Other Ambulatory Visit: Payer: Self-pay

## 2018-03-08 ENCOUNTER — Encounter: Payer: Self-pay | Admitting: Radiation Oncology

## 2018-03-08 DIAGNOSIS — Z87442 Personal history of urinary calculi: Secondary | ICD-10-CM | POA: Diagnosis not present

## 2018-03-08 DIAGNOSIS — Z79899 Other long term (current) drug therapy: Secondary | ICD-10-CM | POA: Insufficient documentation

## 2018-03-08 DIAGNOSIS — I10 Essential (primary) hypertension: Secondary | ICD-10-CM | POA: Insufficient documentation

## 2018-03-08 DIAGNOSIS — D0511 Intraductal carcinoma in situ of right breast: Secondary | ICD-10-CM | POA: Diagnosis present

## 2018-03-08 DIAGNOSIS — K7460 Unspecified cirrhosis of liver: Secondary | ICD-10-CM | POA: Insufficient documentation

## 2018-03-08 DIAGNOSIS — F418 Other specified anxiety disorders: Secondary | ICD-10-CM | POA: Diagnosis not present

## 2018-03-08 DIAGNOSIS — Z170 Estrogen receptor positive status [ER+]: Secondary | ICD-10-CM | POA: Diagnosis not present

## 2018-03-08 DIAGNOSIS — J449 Chronic obstructive pulmonary disease, unspecified: Secondary | ICD-10-CM | POA: Diagnosis not present

## 2018-03-08 DIAGNOSIS — F17210 Nicotine dependence, cigarettes, uncomplicated: Secondary | ICD-10-CM | POA: Diagnosis not present

## 2018-03-08 DIAGNOSIS — F1020 Alcohol dependence, uncomplicated: Secondary | ICD-10-CM | POA: Diagnosis not present

## 2018-03-08 DIAGNOSIS — Z803 Family history of malignant neoplasm of breast: Secondary | ICD-10-CM | POA: Insufficient documentation

## 2018-03-08 NOTE — Consult Note (Signed)
NEW PATIENT EVALUATION  Name: [name]  MRN: [id number]  Date:   03/08/2018     DOB: [birth year]   This 57 y.o. 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  REFERRING PHYSICIAN: Titus MouldWhite, Elizabeth Burney*  CHIEF COMPLAINT:  Chief Complaint  Patient presents with  . Breast Cancer    Pt is here for initial consultation of breast cancer    DIAGNOSIS: The encounter diagnosis was Ductal carcinoma in situ (DCIS) of right breast.   PREVIOUS INVESTIGATIONS:  Mammogram and ultrasound reports reviewed Pathology reports reviewed Clinical notes reviewed  HPI: patient is a 57 year old female who presented with an abnormal mammogram of her right breast back in April 2019. At that time there was 2 cm of asymmetry in the upper outer quadrant of the right breast 13 cm from the nipple.she underwent wide local excision showing 1 summary area ductal carcinoma in situ with a small focus of necrosis. Tumor was overall grade 2. After repeated excisions of the margin margin was negative.tumor was strongly ER/PR positive.her referral radiation oncologist has been delayed secondary to a stage II revision of right total hip arthroplasty. She is still slowly recovering from that is able to bear weight all has although has yet not started physical therapy. Her breast is well-healed. Patient does have a history of cirrhosis. She seen today for radiation oncology opinion. She is oriented been giving a prescription for tamoxifen.  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    PAST SURGICAL HISTORY:  Past Surgical History:  Procedure Laterality Date  . BREAST BIOPSY Right 09/11/2016   Affirm Biopsy- Path pending  . CESAREAN SECTION  1992   x1  . COLONOSCOPY    . COLONOSCOPY WITH PROPOFOL N/A 01/30/2017   Procedure: COLONOSCOPY WITH PROPOFOL;  Surgeon: Christena DeemSkulskie, Martin U, MD;  [facility];  Service: Endoscopy;  Laterality: N/A;  . DILATION AND CURETTAGE OF UTERUS    . ESOPHAGOGASTRODUODENOSCOPY (EGD) WITH PROPOFOL N/A 01/30/2017   Procedure: ESOPHAGOGASTRODUODENOSCOPY (EGD) WITH PROPOFOL;  Surgeon: Christena DeemSkulskie, Martin U, MD;  [facility];  Service: Endoscopy;  Laterality: N/A;  . ESOPHAGOGASTRODUODENOSCOPY ENDOSCOPY    . INCISION AND DRAINAGE HIP Right 12/04/2016   Procedure: IRRIGATION AND DEBRIDEMENT HIP;  Surgeon: Kennedy BuckerMenz, Michael, MD;  Location: [facility] ORS;  Service: Orthopedics;  Laterality: Right;  . JOINT REPLACEMENT Right 10/2015   Hip  . TOTAL HIP ARTHROPLASTY Right 10/16/2015   Procedure: TOTAL HIP ARTHROPLASTY ANTERIOR APPROACH;  Surgeon: Kennedy BuckerMichael Menz, MD;  Location: [facility] ORS;  Service: Orthopedics;  Laterality: Right;  . TUBAL LIGATION      FAMILY HISTORY: family history includes Breast cancer (age of onset: 5940) in her mother; Breast cancer (age of onset: 760) in her maternal aunt.  SOCIAL HISTORY:  reports that she quit smoking about 19 months ago. Her smoking use included cigarettes. She smoked 1.00 pack per day. She has never used smokeless tobacco. She reports that she does not drink alcohol or use drugs.  ALLERGIES: Chantix [varenicline] and Codeine  MEDICATIONS:  Current Outpatient Medications  Medication Sig Dispense Refill  . albuterol (PROVENTIL HFA;VENTOLIN HFA) 108 (90 Base) MCG/ACT  inhaler Inhale into the lungs every 6 (six) hours as needed for wheezing or shortness of breath.    Marland Kitchen atorvastatin (LIPITOR) 10 MG tablet Take 10 mg by mouth daily.  2  . B Complex Vitamins (VITAMIN B COMPLEX) TABS Take 1 tablet by mouth daily.    . Cholecalciferol (VITAMIN D3) 5000 units CAPS Take 5,000 Units by mouth daily.     . Fluticasone-Salmeterol (ADVAIR DISKUS) 250-50 MCG/DOSE AEPB Inhale 1 puff into the lungs 2 (two) times daily.    . Ipratropium-Albuterol (COMBIVENT RESPIMAT) 20-100 MCG/ACT AERS respimat Inhale 1 puff into the lungs every 6 (six) hours as needed for wheezing.    Marland Kitchen lisinopril (PRINIVIL,ZESTRIL) 5 MG tablet Take 5 mg by mouth daily.  2  . meloxicam (MOBIC) 15 MG tablet Take 15 mg by mouth daily.    . nadolol (CORGARD) 20 MG tablet Take 40 mg by mouth daily.    . pantoprazole (PROTONIX) 40 MG tablet Take 40 mg by mouth daily.    Marland Kitchen tiotropium (SPIRIVA) 18 MCG inhalation capsule Place 18 mcg into inhaler and inhale daily.    Marland Kitchen oxyCODONE (OXY IR/ROXICODONE) 5 MG immediate release tablet Take 1-2 tablets (5-10 mg total) by mouth every 6 (six) hours as needed for breakthrough pain. (Patient not taking: Reported on 03/08/2018) 30 tablet 0  . sulfamethoxazole-trimethoprim (BACTRIM DS,SEPTRA DS) 800-160 MG tablet Take 1 tablet by mouth every 12 (twelve) hours. (Patient not taking: Reported on 01/30/2017) 20 tablet 0   No current facility-administered medications for this encounter.     ECOG PERFORMANCE STATUS:  0 - Asymptomatic  REVIEW OF SYSTEMS: patient has multiple medical comorbidities as listed above otherwise  Patient denies any weight loss, fatigue, weakness, fever, chills or night sweats. Patient denies any loss of vision, blurred vision. Patient denies any ringing  of the ears or hearing loss. No irregular heartbeat. Patient denies heart murmur or history of fainting. Patient denies any chest pain or pain radiating to her upper extremities. Patient denies any shortness of  breath, difficulty breathing at night, cough or hemoptysis. Patient denies any swelling in the lower legs. Patient denies any nausea vomiting, vomiting of blood, or coffee ground material in the vomitus. Patient denies any stomach pain. Patient states has had normal bowel movements no significant constipation or diarrhea. Patient denies any dysuria, hematuria or significant nocturia. Patient denies any problems walking, swelling in the joints or loss of balance. Patient denies any skin changes, loss of hair or loss of weight. Patient denies any excessive worrying or anxiety or significant depression. Patient denies any problems with insomnia. Patient denies excessive thirst, polyuria, polydipsia. Patient denies any swollen glands, patient denies easy bruising or easy bleeding. Patient denies any recent infections, allergies or URI. Patient "s visual fields have not changed significantly in recent time.   PHYSICAL EXAM: BP (!) (P) 153/93 (BP Location: Left Arm, Patient Position: Sitting)   Pulse (P) 66   Temp (!) (P) 97.2 F (36.2 C) (Tympanic)   Resp (P) 18   Wt (P) 187 lb 11.6 oz (85.2 kg)   LMP 09/30/2002 (Within Months)   BMI (P) 33.25 kg/m  A wheelchair-bound well-developed slightly obese female in NAD. Right breast is wide local excision scar which is well-healed. No dominant mass or nodularity is noted in either breast in 2 positions examined. No  axillary or supra clavicular adenopathy is appreciated.Well-developed well-nourished patient in NAD. HEENT reveals PERLA, EOMI, discs not visualized.  Oral cavity is clear. No oral mucosal lesions are identified. Neck is clear without evidence of cervical or supraclavicular adenopathy. Lungs are clear to A&P. Cardiac examination is essentially unremarkable with regular rate and rhythm without murmur rub or thrill. Abdomen is benign with no organomegaly or masses noted. Motor sensory and DTR levels are equal and symmetric in the upper and lower extremities.  Cranial nerves II through XII are grossly intact. Proprioception is intact. No peripheral adenopathy or edema is identified. No motor or sensory levels are noted. Crude visual fields are within normal range.  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  PLAN: at this time I recommended whole breast radiation. Her breast is large and pendulous making hypofractionated course of treatment difficult. I would plan on delivering 5040 cGy in 28 fractions boosting her scar another 1400 cGy using electron beam. Risks and benefits of treatment including skin reaction fatigue alteration of blood counts possible inclusion of superficial lung all were discussed in detail with the patient. She seems to comprehend my treatment plan well. I personally set up and ordered CT simulation for next week.Patient also will be candidate for tamoxifen therapy after completion of radiation. Patient and husband both comprehend my treatment plan well  I would like to take this opportunity to thank you for allowing me to participate in the care of your patient.Carmina Miller, MD

## 2018-03-17 ENCOUNTER — Ambulatory Visit
Admission: RE | Admit: 2018-03-17 | Discharge: 2018-03-17 | Disposition: A | Discharge status: Home / Self Care | Payer: Medicare Other | Source: Ambulatory Visit | Attending: Radiation Oncology | Admitting: Radiation Oncology

## 2018-03-17 DIAGNOSIS — D0511 Intraductal carcinoma in situ of right breast: Secondary | ICD-10-CM | POA: Insufficient documentation

## 2018-03-17 DIAGNOSIS — Z510 Encounter for antineoplastic radiation therapy: Secondary | ICD-10-CM | POA: Insufficient documentation

## 2018-03-18 ENCOUNTER — Other Ambulatory Visit: Payer: Self-pay | Admitting: *Deleted

## 2018-03-18 DIAGNOSIS — Z510 Encounter for antineoplastic radiation therapy: Secondary | ICD-10-CM | POA: Diagnosis not present

## 2018-03-18 DIAGNOSIS — D0511 Intraductal carcinoma in situ of right breast: Secondary | ICD-10-CM

## 2018-03-24 ENCOUNTER — Ambulatory Visit
Admission: RE | Admit: 2018-03-24 | Discharge: 2018-03-24 | Disposition: A | Discharge status: Home / Self Care | Payer: Medicare Other | Source: Ambulatory Visit | Attending: Radiation Oncology | Admitting: Radiation Oncology

## 2018-03-24 DIAGNOSIS — Z510 Encounter for antineoplastic radiation therapy: Secondary | ICD-10-CM | POA: Diagnosis not present

## 2018-03-25 ENCOUNTER — Ambulatory Visit
Admission: RE | Admit: 2018-03-25 | Discharge: 2018-03-25 | Disposition: A | Discharge status: Home / Self Care | Payer: Medicare Other | Source: Ambulatory Visit | Attending: Radiation Oncology | Admitting: Radiation Oncology

## 2018-03-25 DIAGNOSIS — Z510 Encounter for antineoplastic radiation therapy: Secondary | ICD-10-CM | POA: Diagnosis not present

## 2018-03-26 ENCOUNTER — Ambulatory Visit
Admission: RE | Admit: 2018-03-26 | Discharge: 2018-03-26 | Disposition: A | Discharge status: Home / Self Care | Payer: Medicare Other | Source: Ambulatory Visit | Attending: Radiation Oncology | Admitting: Radiation Oncology

## 2018-03-26 ENCOUNTER — Ambulatory Visit: Payer: Medicare Other

## 2018-03-26 DIAGNOSIS — Z510 Encounter for antineoplastic radiation therapy: Secondary | ICD-10-CM | POA: Diagnosis not present

## 2018-03-29 ENCOUNTER — Ambulatory Visit: Payer: Medicare Other

## 2018-03-29 ENCOUNTER — Ambulatory Visit
Admission: RE | Admit: 2018-03-29 | Discharge: 2018-03-29 | Disposition: A | Discharge status: Home / Self Care | Payer: Medicare Other | Source: Ambulatory Visit | Attending: Radiation Oncology | Admitting: Radiation Oncology

## 2018-03-29 DIAGNOSIS — Z510 Encounter for antineoplastic radiation therapy: Secondary | ICD-10-CM | POA: Diagnosis not present

## 2018-03-30 ENCOUNTER — Ambulatory Visit: Payer: Medicare Other

## 2018-03-30 ENCOUNTER — Ambulatory Visit
Admission: RE | Admit: 2018-03-30 | Discharge: 2018-03-30 | Disposition: A | Discharge status: Home / Self Care | Payer: Medicare Other | Source: Ambulatory Visit | Attending: Radiation Oncology | Admitting: Radiation Oncology

## 2018-03-30 DIAGNOSIS — Z510 Encounter for antineoplastic radiation therapy: Secondary | ICD-10-CM | POA: Diagnosis not present

## 2018-03-31 ENCOUNTER — Ambulatory Visit
Admission: RE | Admit: 2018-03-31 | Discharge: 2018-03-31 | Disposition: A | Discharge status: Home / Self Care | Payer: Medicare Other | Source: Ambulatory Visit | Attending: Radiation Oncology | Admitting: Radiation Oncology

## 2018-03-31 ENCOUNTER — Ambulatory Visit: Payer: Medicare Other

## 2018-03-31 DIAGNOSIS — Z510 Encounter for antineoplastic radiation therapy: Secondary | ICD-10-CM | POA: Diagnosis not present

## 2018-04-01 ENCOUNTER — Ambulatory Visit: Payer: Medicare Other

## 2018-04-01 ENCOUNTER — Ambulatory Visit
Admission: RE | Admit: 2018-04-01 | Discharge: 2018-04-01 | Disposition: A | Discharge status: Home / Self Care | Payer: Medicare Other | Source: Ambulatory Visit | Attending: Radiation Oncology | Admitting: Radiation Oncology

## 2018-04-01 DIAGNOSIS — Z510 Encounter for antineoplastic radiation therapy: Secondary | ICD-10-CM | POA: Diagnosis not present

## 2018-04-02 ENCOUNTER — Ambulatory Visit
Admission: RE | Admit: 2018-04-02 | Discharge: 2018-04-02 | Disposition: A | Discharge status: Home / Self Care | Payer: Medicare Other | Source: Ambulatory Visit | Attending: Radiation Oncology | Admitting: Radiation Oncology

## 2018-04-02 ENCOUNTER — Ambulatory Visit: Payer: Medicare Other

## 2018-04-02 DIAGNOSIS — Z510 Encounter for antineoplastic radiation therapy: Secondary | ICD-10-CM | POA: Diagnosis not present

## 2018-04-05 ENCOUNTER — Ambulatory Visit: Payer: Medicare Other

## 2018-04-05 ENCOUNTER — Ambulatory Visit
Admission: RE | Admit: 2018-04-05 | Discharge: 2018-04-05 | Disposition: A | Discharge status: Home / Self Care | Payer: Medicare Other | Source: Ambulatory Visit | Attending: Radiation Oncology | Admitting: Radiation Oncology

## 2018-04-05 DIAGNOSIS — Z510 Encounter for antineoplastic radiation therapy: Secondary | ICD-10-CM | POA: Diagnosis not present

## 2018-04-06 ENCOUNTER — Ambulatory Visit: Payer: Medicare Other

## 2018-04-06 ENCOUNTER — Ambulatory Visit
Admission: RE | Admit: 2018-04-06 | Discharge: 2018-04-06 | Disposition: A | Discharge status: Home / Self Care | Payer: Medicare Other | Source: Ambulatory Visit | Attending: Radiation Oncology | Admitting: Radiation Oncology

## 2018-04-06 DIAGNOSIS — Z510 Encounter for antineoplastic radiation therapy: Secondary | ICD-10-CM | POA: Diagnosis not present

## 2018-04-07 ENCOUNTER — Ambulatory Visit: Payer: Medicare Other

## 2018-04-07 ENCOUNTER — Inpatient Hospital Stay: Payer: Medicare Other | Attending: Radiation Oncology

## 2018-04-07 ENCOUNTER — Ambulatory Visit
Admission: RE | Admit: 2018-04-07 | Discharge: 2018-04-07 | Disposition: A | Discharge status: Home / Self Care | Payer: Medicare Other | Source: Ambulatory Visit | Attending: Radiation Oncology | Admitting: Radiation Oncology

## 2018-04-07 DIAGNOSIS — D0511 Intraductal carcinoma in situ of right breast: Secondary | ICD-10-CM | POA: Insufficient documentation

## 2018-04-07 DIAGNOSIS — Z510 Encounter for antineoplastic radiation therapy: Secondary | ICD-10-CM | POA: Diagnosis not present

## 2018-04-07 LAB — CBC
HEMATOCRIT: 31.9 % — AB (ref 35.0–47.0)
HEMOGLOBIN: 10.4 g/dL — AB (ref 12.0–16.0)
MCH: 24.3 pg — ABNORMAL LOW (ref 26.0–34.0)
MCHC: 32.6 g/dL (ref 32.0–36.0)
MCV: 74.7 fL — ABNORMAL LOW (ref 80.0–100.0)
PLATELETS: 119 10*3/uL — AB (ref 150–440)
RBC: 4.26 MIL/uL (ref 3.80–5.20)
RDW: 26.7 % — AB (ref 11.5–14.5)
WBC: 4.3 10*3/uL (ref 3.6–11.0)

## 2018-04-08 ENCOUNTER — Ambulatory Visit: Payer: Medicare Other

## 2018-04-08 ENCOUNTER — Ambulatory Visit
Admission: RE | Admit: 2018-04-08 | Discharge: 2018-04-08 | Disposition: A | Discharge status: Home / Self Care | Payer: Medicare Other | Source: Ambulatory Visit | Attending: Radiation Oncology | Admitting: Radiation Oncology

## 2018-04-08 DIAGNOSIS — Z510 Encounter for antineoplastic radiation therapy: Secondary | ICD-10-CM | POA: Diagnosis not present

## 2018-04-09 ENCOUNTER — Ambulatory Visit: Payer: Medicare Other

## 2018-04-09 ENCOUNTER — Ambulatory Visit
Admission: RE | Admit: 2018-04-09 | Discharge: 2018-04-09 | Disposition: A | Discharge status: Home / Self Care | Payer: Medicare Other | Source: Ambulatory Visit | Attending: Radiation Oncology | Admitting: Radiation Oncology

## 2018-04-09 DIAGNOSIS — Z510 Encounter for antineoplastic radiation therapy: Secondary | ICD-10-CM | POA: Diagnosis not present

## 2018-04-12 ENCOUNTER — Ambulatory Visit
Admission: RE | Admit: 2018-04-12 | Discharge: 2018-04-12 | Disposition: A | Discharge status: Home / Self Care | Payer: Medicare Other | Source: Ambulatory Visit | Attending: Radiation Oncology | Admitting: Radiation Oncology

## 2018-04-12 ENCOUNTER — Ambulatory Visit: Payer: Medicare Other

## 2018-04-12 DIAGNOSIS — Z510 Encounter for antineoplastic radiation therapy: Secondary | ICD-10-CM | POA: Diagnosis not present

## 2018-04-13 ENCOUNTER — Ambulatory Visit: Payer: Medicare Other

## 2018-04-13 ENCOUNTER — Ambulatory Visit
Admission: RE | Admit: 2018-04-13 | Discharge: 2018-04-13 | Disposition: A | Discharge status: Home / Self Care | Payer: Medicare Other | Source: Ambulatory Visit | Attending: Radiation Oncology | Admitting: Radiation Oncology

## 2018-04-13 DIAGNOSIS — D0511 Intraductal carcinoma in situ of right breast: Secondary | ICD-10-CM | POA: Insufficient documentation

## 2018-04-13 DIAGNOSIS — Z510 Encounter for antineoplastic radiation therapy: Secondary | ICD-10-CM | POA: Insufficient documentation

## 2018-04-14 ENCOUNTER — Ambulatory Visit: Payer: Medicare Other

## 2018-04-14 ENCOUNTER — Ambulatory Visit
Admission: RE | Admit: 2018-04-14 | Discharge: 2018-04-14 | Disposition: A | Discharge status: Home / Self Care | Payer: Medicare Other | Source: Ambulatory Visit | Attending: Radiation Oncology | Admitting: Radiation Oncology

## 2018-04-14 DIAGNOSIS — Z510 Encounter for antineoplastic radiation therapy: Secondary | ICD-10-CM | POA: Diagnosis not present

## 2018-04-15 ENCOUNTER — Ambulatory Visit: Payer: Medicare Other

## 2018-04-15 ENCOUNTER — Ambulatory Visit
Admission: RE | Admit: 2018-04-15 | Discharge: 2018-04-15 | Disposition: A | Discharge status: Home / Self Care | Payer: Medicare Other | Source: Ambulatory Visit | Attending: Radiation Oncology | Admitting: Radiation Oncology

## 2018-04-15 DIAGNOSIS — Z510 Encounter for antineoplastic radiation therapy: Secondary | ICD-10-CM | POA: Diagnosis not present

## 2018-04-16 ENCOUNTER — Ambulatory Visit: Payer: Medicare Other

## 2018-04-16 ENCOUNTER — Ambulatory Visit
Admission: RE | Admit: 2018-04-16 | Discharge: 2018-04-16 | Disposition: A | Discharge status: Home / Self Care | Payer: Medicare Other | Source: Ambulatory Visit | Attending: Radiation Oncology | Admitting: Radiation Oncology

## 2018-04-16 DIAGNOSIS — Z510 Encounter for antineoplastic radiation therapy: Secondary | ICD-10-CM | POA: Diagnosis not present

## 2018-04-19 ENCOUNTER — Ambulatory Visit
Admission: RE | Admit: 2018-04-19 | Discharge: 2018-04-19 | Disposition: A | Discharge status: Home / Self Care | Payer: Medicare Other | Source: Ambulatory Visit | Attending: Radiation Oncology | Admitting: Radiation Oncology

## 2018-04-19 ENCOUNTER — Ambulatory Visit: Payer: Medicare Other

## 2018-04-19 DIAGNOSIS — Z510 Encounter for antineoplastic radiation therapy: Secondary | ICD-10-CM | POA: Diagnosis not present

## 2018-04-20 ENCOUNTER — Ambulatory Visit: Payer: Medicare Other

## 2018-04-20 ENCOUNTER — Ambulatory Visit
Admission: RE | Admit: 2018-04-20 | Discharge: 2018-04-20 | Disposition: A | Discharge status: Home / Self Care | Payer: Medicare Other | Source: Ambulatory Visit | Attending: Radiation Oncology | Admitting: Radiation Oncology

## 2018-04-20 DIAGNOSIS — Z510 Encounter for antineoplastic radiation therapy: Secondary | ICD-10-CM | POA: Diagnosis not present

## 2018-04-21 ENCOUNTER — Ambulatory Visit: Payer: Medicare Other

## 2018-04-21 ENCOUNTER — Ambulatory Visit
Admission: RE | Admit: 2018-04-21 | Discharge: 2018-04-21 | Disposition: A | Discharge status: Home / Self Care | Payer: Medicare Other | Source: Ambulatory Visit | Attending: Radiation Oncology | Admitting: Radiation Oncology

## 2018-04-21 ENCOUNTER — Inpatient Hospital Stay: Payer: Medicare Other | Attending: Radiation Oncology

## 2018-04-21 DIAGNOSIS — D0511 Intraductal carcinoma in situ of right breast: Secondary | ICD-10-CM | POA: Diagnosis not present

## 2018-04-21 DIAGNOSIS — Z510 Encounter for antineoplastic radiation therapy: Secondary | ICD-10-CM | POA: Diagnosis not present

## 2018-04-21 LAB — CBC
HEMATOCRIT: 34.4 % — AB (ref 36.0–46.0)
HEMOGLOBIN: 10.6 g/dL — AB (ref 12.0–15.0)
MCH: 24 pg — AB (ref 26.0–34.0)
MCHC: 30.8 g/dL (ref 30.0–36.0)
MCV: 78 fL — ABNORMAL LOW (ref 80.0–100.0)
Platelets: 126 10*3/uL — ABNORMAL LOW (ref 150–400)
RBC: 4.41 MIL/uL (ref 3.87–5.11)
RDW: 22.5 % — ABNORMAL HIGH (ref 11.5–15.5)
WBC: 5.5 10*3/uL (ref 4.0–10.5)
nRBC: 0 % (ref 0.0–0.2)

## 2018-04-22 ENCOUNTER — Ambulatory Visit
Admission: RE | Admit: 2018-04-22 | Discharge: 2018-04-22 | Disposition: A | Discharge status: Home / Self Care | Payer: Medicare Other | Source: Ambulatory Visit | Attending: Radiation Oncology | Admitting: Radiation Oncology

## 2018-04-22 ENCOUNTER — Ambulatory Visit: Payer: Medicare Other

## 2018-04-22 DIAGNOSIS — Z510 Encounter for antineoplastic radiation therapy: Secondary | ICD-10-CM | POA: Diagnosis not present

## 2018-04-23 ENCOUNTER — Ambulatory Visit
Admission: RE | Admit: 2018-04-23 | Discharge: 2018-04-23 | Disposition: A | Discharge status: Home / Self Care | Payer: Medicare Other | Source: Ambulatory Visit | Attending: Radiation Oncology | Admitting: Radiation Oncology

## 2018-04-23 ENCOUNTER — Ambulatory Visit: Payer: Medicare Other

## 2018-04-23 DIAGNOSIS — Z510 Encounter for antineoplastic radiation therapy: Secondary | ICD-10-CM | POA: Diagnosis not present

## 2018-04-26 ENCOUNTER — Ambulatory Visit: Payer: Medicare Other

## 2018-04-26 ENCOUNTER — Ambulatory Visit
Admission: RE | Admit: 2018-04-26 | Discharge: 2018-04-26 | Disposition: A | Discharge status: Home / Self Care | Payer: Medicare Other | Source: Ambulatory Visit | Attending: Radiation Oncology | Admitting: Radiation Oncology

## 2018-04-26 DIAGNOSIS — Z510 Encounter for antineoplastic radiation therapy: Secondary | ICD-10-CM | POA: Diagnosis not present

## 2018-04-27 ENCOUNTER — Ambulatory Visit
Admission: RE | Admit: 2018-04-27 | Discharge: 2018-04-27 | Disposition: A | Discharge status: Home / Self Care | Payer: Medicare Other | Source: Ambulatory Visit | Attending: Radiation Oncology | Admitting: Radiation Oncology

## 2018-04-27 ENCOUNTER — Ambulatory Visit: Payer: Medicare Other

## 2018-04-27 DIAGNOSIS — Z510 Encounter for antineoplastic radiation therapy: Secondary | ICD-10-CM | POA: Diagnosis not present

## 2018-04-28 ENCOUNTER — Ambulatory Visit: Payer: Medicare Other

## 2018-04-28 ENCOUNTER — Ambulatory Visit
Admission: RE | Admit: 2018-04-28 | Discharge: 2018-04-28 | Disposition: A | Discharge status: Home / Self Care | Payer: Medicare Other | Source: Ambulatory Visit | Attending: Radiation Oncology | Admitting: Radiation Oncology

## 2018-04-28 DIAGNOSIS — Z510 Encounter for antineoplastic radiation therapy: Secondary | ICD-10-CM | POA: Diagnosis not present

## 2018-04-29 ENCOUNTER — Ambulatory Visit: Payer: Medicare Other

## 2018-04-29 ENCOUNTER — Ambulatory Visit
Admission: RE | Admit: 2018-04-29 | Discharge: 2018-04-29 | Disposition: A | Discharge status: Home / Self Care | Payer: Medicare Other | Source: Ambulatory Visit | Attending: Radiation Oncology | Admitting: Radiation Oncology

## 2018-04-29 DIAGNOSIS — Z510 Encounter for antineoplastic radiation therapy: Secondary | ICD-10-CM | POA: Diagnosis not present

## 2018-04-30 ENCOUNTER — Ambulatory Visit
Admission: RE | Admit: 2018-04-30 | Discharge: 2018-04-30 | Disposition: A | Discharge status: Home / Self Care | Payer: Medicare Other | Source: Ambulatory Visit | Attending: Radiation Oncology | Admitting: Radiation Oncology

## 2018-04-30 ENCOUNTER — Ambulatory Visit: Payer: Medicare Other

## 2018-04-30 DIAGNOSIS — Z510 Encounter for antineoplastic radiation therapy: Secondary | ICD-10-CM | POA: Diagnosis not present

## 2018-05-03 ENCOUNTER — Ambulatory Visit
Admission: RE | Admit: 2018-05-03 | Discharge: 2018-05-03 | Disposition: A | Discharge status: Home / Self Care | Payer: Medicare Other | Source: Ambulatory Visit | Attending: Radiation Oncology | Admitting: Radiation Oncology

## 2018-05-03 ENCOUNTER — Ambulatory Visit: Payer: Medicare Other

## 2018-05-03 DIAGNOSIS — Z510 Encounter for antineoplastic radiation therapy: Secondary | ICD-10-CM | POA: Diagnosis not present

## 2018-05-04 ENCOUNTER — Ambulatory Visit: Payer: Medicare Other

## 2018-05-04 ENCOUNTER — Ambulatory Visit
Admission: RE | Admit: 2018-05-04 | Discharge: 2018-05-04 | Disposition: A | Discharge status: Home / Self Care | Payer: Medicare Other | Source: Ambulatory Visit | Attending: Radiation Oncology | Admitting: Radiation Oncology

## 2018-05-04 DIAGNOSIS — Z510 Encounter for antineoplastic radiation therapy: Secondary | ICD-10-CM | POA: Diagnosis not present

## 2018-05-05 ENCOUNTER — Inpatient Hospital Stay: Payer: Medicare Other

## 2018-05-05 ENCOUNTER — Ambulatory Visit: Payer: Medicare Other

## 2018-05-05 ENCOUNTER — Other Ambulatory Visit: Payer: Self-pay

## 2018-05-05 ENCOUNTER — Ambulatory Visit
Admission: RE | Admit: 2018-05-05 | Discharge: 2018-05-05 | Disposition: A | Discharge status: Home / Self Care | Payer: Medicare Other | Source: Ambulatory Visit | Attending: Radiation Oncology | Admitting: Radiation Oncology

## 2018-05-05 DIAGNOSIS — Z510 Encounter for antineoplastic radiation therapy: Secondary | ICD-10-CM | POA: Diagnosis not present

## 2018-05-05 DIAGNOSIS — D0511 Intraductal carcinoma in situ of right breast: Secondary | ICD-10-CM | POA: Diagnosis not present

## 2018-05-05 LAB — CBC
HEMATOCRIT: 33.3 % — AB (ref 36.0–46.0)
HEMOGLOBIN: 10.3 g/dL — AB (ref 12.0–15.0)
MCH: 25 pg — AB (ref 26.0–34.0)
MCHC: 30.9 g/dL (ref 30.0–36.0)
MCV: 80.8 fL (ref 80.0–100.0)
Platelets: 135 10*3/uL — ABNORMAL LOW (ref 150–400)
RBC: 4.12 MIL/uL (ref 3.87–5.11)
RDW: 21 % — ABNORMAL HIGH (ref 11.5–15.5)
WBC: 5.6 10*3/uL (ref 4.0–10.5)
nRBC: 0 % (ref 0.0–0.2)

## 2018-05-06 ENCOUNTER — Ambulatory Visit
Admission: RE | Admit: 2018-05-06 | Discharge: 2018-05-06 | Disposition: A | Discharge status: Home / Self Care | Payer: Medicare Other | Source: Ambulatory Visit | Attending: Radiation Oncology | Admitting: Radiation Oncology

## 2018-05-06 ENCOUNTER — Ambulatory Visit: Payer: Medicare Other

## 2018-05-06 DIAGNOSIS — Z510 Encounter for antineoplastic radiation therapy: Secondary | ICD-10-CM | POA: Diagnosis not present

## 2018-05-07 ENCOUNTER — Ambulatory Visit: Payer: Medicare Other

## 2018-05-07 ENCOUNTER — Ambulatory Visit
Admission: RE | Admit: 2018-05-07 | Discharge: 2018-05-07 | Disposition: A | Discharge status: Home / Self Care | Payer: Medicare Other | Source: Ambulatory Visit | Attending: Radiation Oncology | Admitting: Radiation Oncology

## 2018-05-07 DIAGNOSIS — Z510 Encounter for antineoplastic radiation therapy: Secondary | ICD-10-CM | POA: Diagnosis not present

## 2018-05-10 ENCOUNTER — Ambulatory Visit
Admission: RE | Admit: 2018-05-10 | Discharge: 2018-05-10 | Disposition: A | Discharge status: Home / Self Care | Payer: Medicare Other | Source: Ambulatory Visit | Attending: Radiation Oncology | Admitting: Radiation Oncology

## 2018-05-10 ENCOUNTER — Ambulatory Visit: Payer: Medicare Other

## 2018-05-10 DIAGNOSIS — Z510 Encounter for antineoplastic radiation therapy: Secondary | ICD-10-CM | POA: Diagnosis not present

## 2018-05-10 IMAGING — MR MR LUMBAR SPINE W/O CM
5 series · 34 of 48 positions shown · non-contrast
Comparison: None.

CLINICAL DATA: Right hip replacement 10/16/2015. A week later
intense low back pain radiating down the posterior legs with
numbness in the feet.

EXAM:
MRI LUMBAR SPINE WITHOUT CONTRAST
TECHNIQUE: Multiplanar, multisequence MR imaging of the lumbar spine was
performed. No intravenous contrast was administered.

[Series 2: T2 · sagittal · 4.0mm · 0.81mm/px · 5 of 15 slices shown (1 of 2)]
[im 1/15]
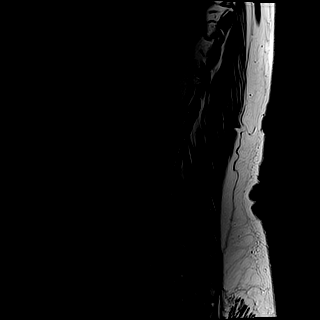
[im 4/15]
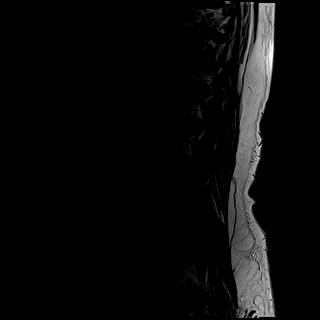
[im 8/15]
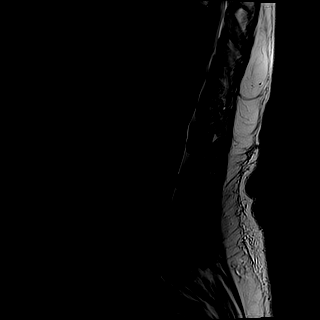
[im 11/15]
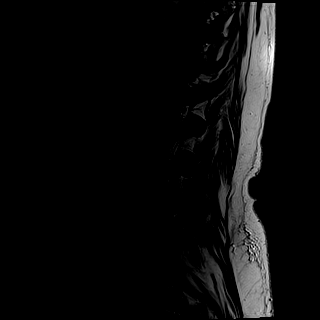
[im 15/15]
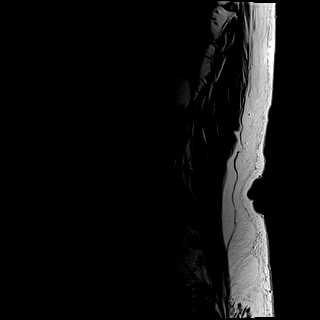

[Series 3: T1 · sagittal · 4.0mm · 0.81mm/px · 5 of 15 slices shown (1 of 2)]
[im 1/15]
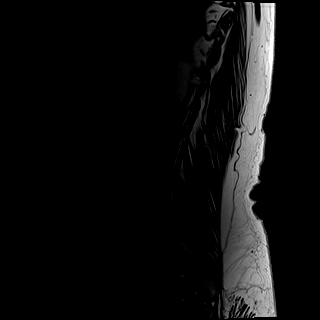
[im 4/15]
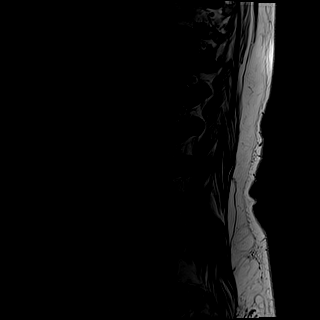
[im 8/15]
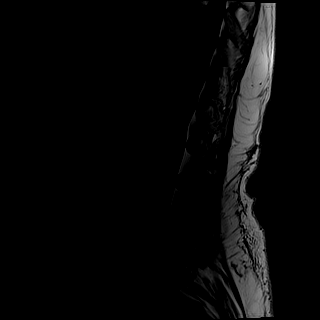
[im 11/15]
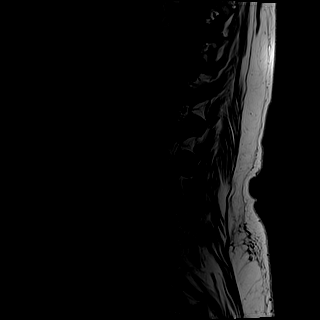
[im 15/15]
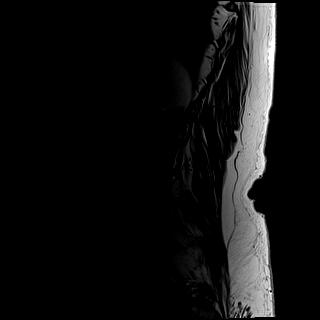

[Series 4: STIR · sagittal · 4.0mm · 1.02mm/px · 4 of 15 slices shown]
[im 1/15]
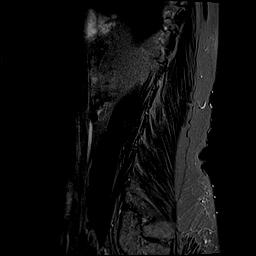
[im 3/15]
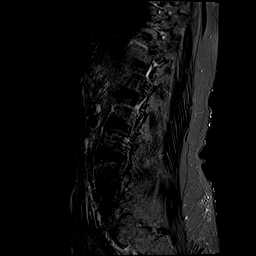
[im 6/15]
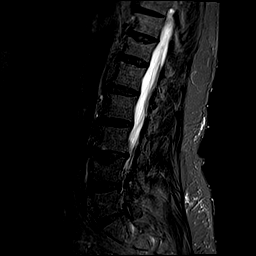
[im 9/15]
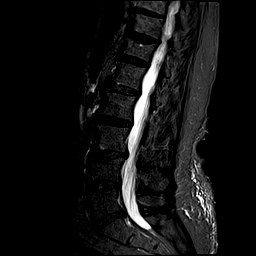

[Series 5: T2 · axial · 4.0mm · 0.78mm/px · z∈[-41,+167]mm · 10 of 42 slices shown (2 of 2)]
[im 3/42]
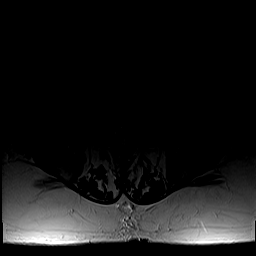
[im 6/42]
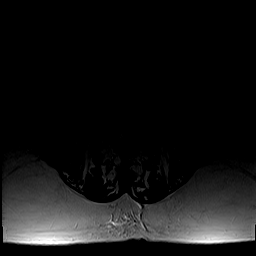
[im 9/42]
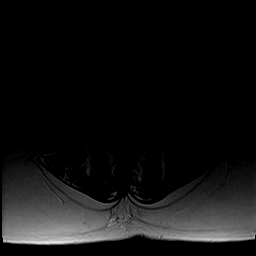
[im 14/42]
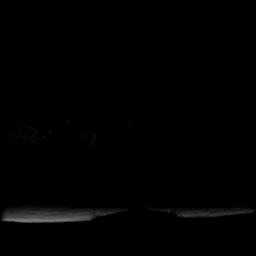
[im 20/42]
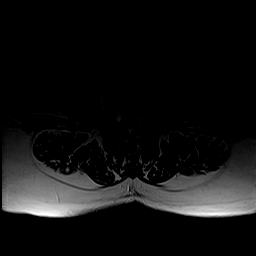
[im 22/42]
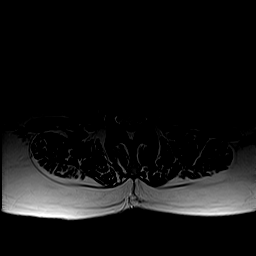
[im 25/42]
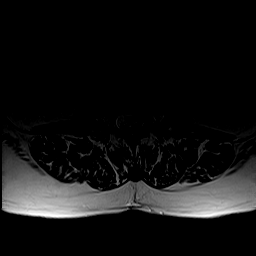
[im 31/42]
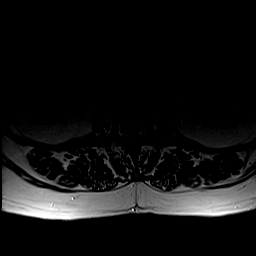
[im 36/42]
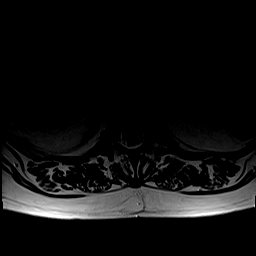
[im 42/42]
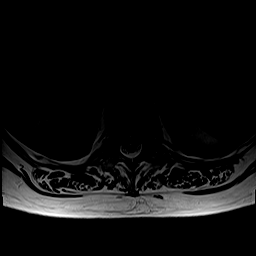

[Series 6: T1 · axial · 4.0mm · 0.39mm/px · z∈[-41,+167]mm · 10 of 42 slices shown (2 of 2)]
[im 3/42]
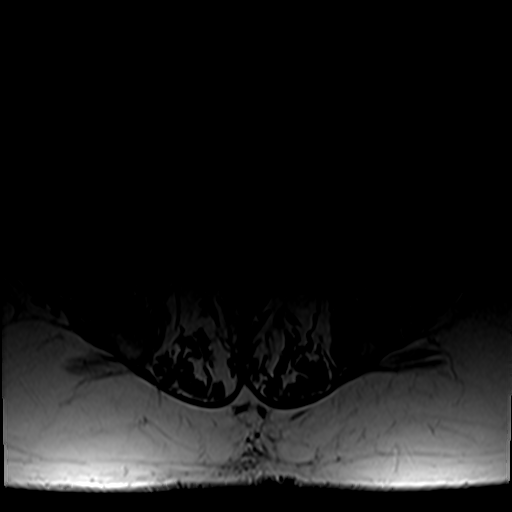
[im 6/42]
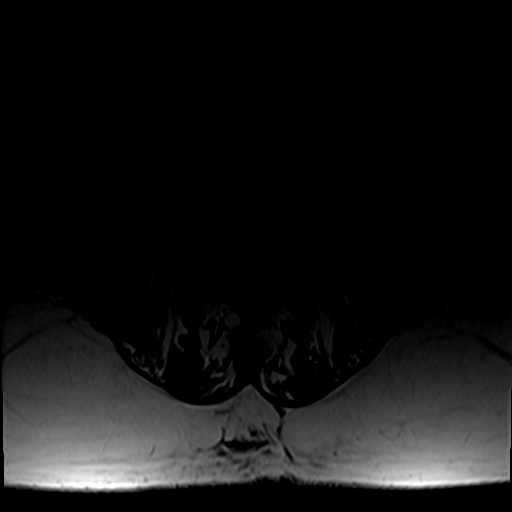
[im 9/42]
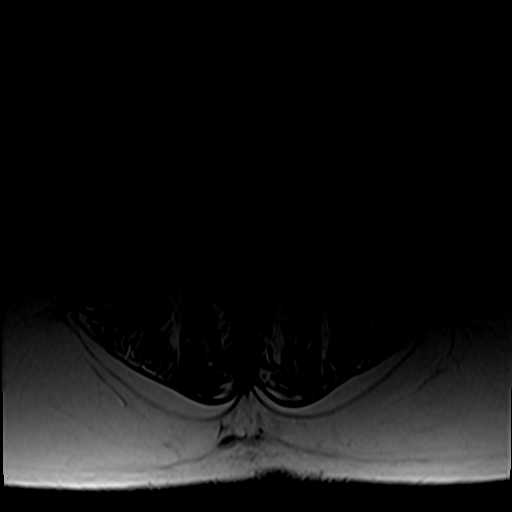
[im 14/42]
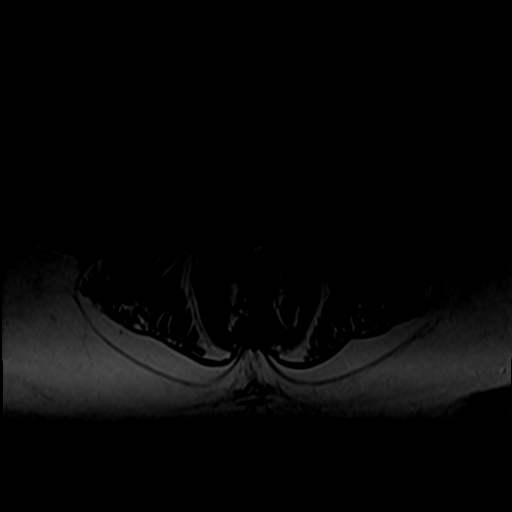
[im 20/42]
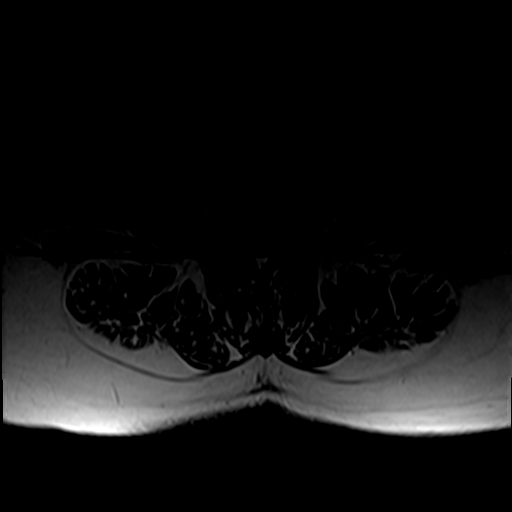
[im 22/42]
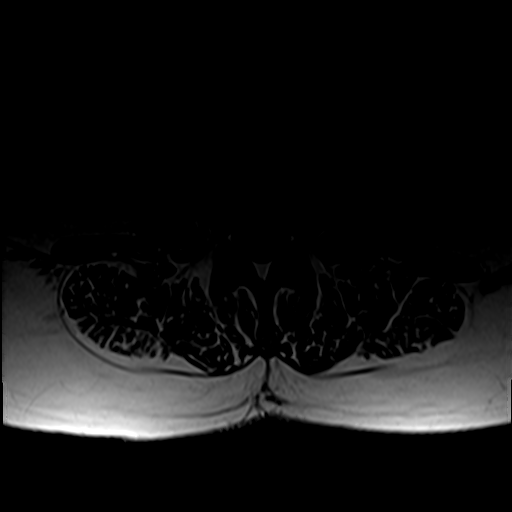
[im 25/42]
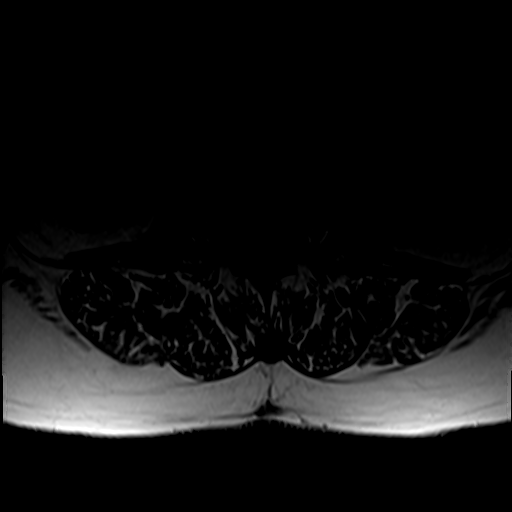
[im 31/42]
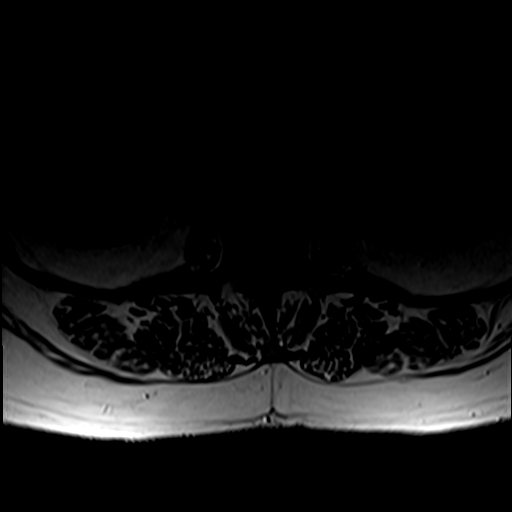
[im 36/42]
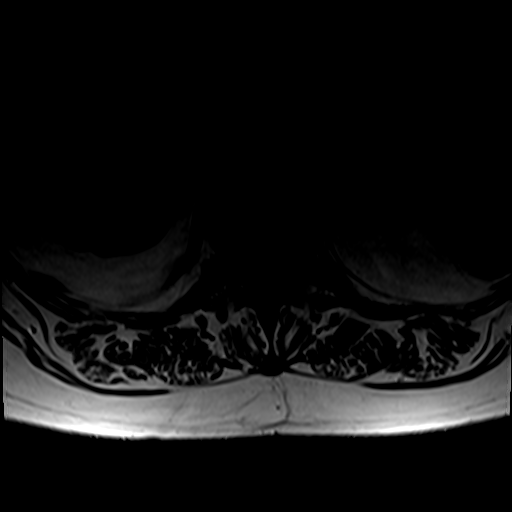
[im 42/42]
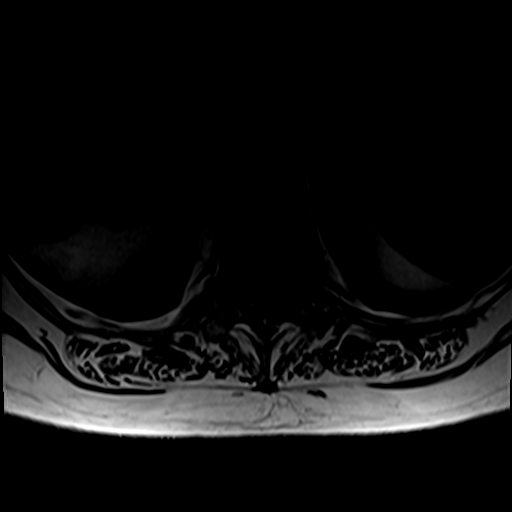

[34 of 48 positions shown; findings below may reference images not displayed]

FINDINGS: Segmentation: Standard.

Alignment: Physiologic.

Vertebrae: Remote T12 and L4 superior endplate fractures. No signal
abnormality to suggest fracture, discitis, or mass.

Conus: Extends to the L2 level and is abnormal with bilateral
central cord hyperintensity correlating with the gray matter. There
is no associated cord compression. No prominent intrathecal vessels
or aortic aneurysm. No bony infarct noted. There is no associated
expansion, suggesting nonacute timing. This appearance is usually
from completed gray matter infarct. Sequela of polio or similar
illness can also have this appearance, but would have a distinct
clinical presentation.

Paraspinal and retroperitoneal structures: Incidental renal cortical
cysts.

Disc levels:

T12- L1:

Mild ventral spurring.  No herniation or impingement

L1-L2:

Ventral spondylotic spurring and mild disc bulging. No herniation or
impingement

L2-L3:

Ventral spondylotic spurring and mild disc narrowing. No herniation
or impingement

L3-L4:

Disc: Narrowing and circumferential bulging.

Facets: Mild ligamentous overgrowth and marginal spurring.

Canal: Patent.

Foramina: No impingement.

L4-L5:

Disc: Disc bulging and mild narrowing.

Facets: Arthropathy with right-sided effusion.

Canal: Patent.

Foramina: No impingement.

L5-S1:

Disc: Narrowing and bulging with a central protrusion.

Facets: Negative.

Canal: Disc contacts the S1 nerves in the subarticular recesses
without compression.

Foramina: No impingement.

These results were called by telephone at the time of interpretation
on 11/21/2015 at [DATE] to Dr. NOMASIBULELE MOATSHE , who verbally
acknowledged these results.
IMPRESSION: 1. Abnormal gray matter at the conus, usually related to infarct, as
above. No conus expansion as seen with acute timing.
2. Disc and facet degeneration without neural compression.

## 2018-05-11 ENCOUNTER — Ambulatory Visit: Payer: Medicare Other

## 2018-05-11 ENCOUNTER — Ambulatory Visit
Admission: RE | Admit: 2018-05-11 | Discharge: 2018-05-11 | Disposition: A | Discharge status: Home / Self Care | Payer: Medicare Other | Source: Ambulatory Visit | Attending: Radiation Oncology | Admitting: Radiation Oncology

## 2018-05-11 DIAGNOSIS — Z510 Encounter for antineoplastic radiation therapy: Secondary | ICD-10-CM | POA: Diagnosis not present

## 2018-05-12 ENCOUNTER — Ambulatory Visit: Payer: Medicare Other

## 2018-05-12 ENCOUNTER — Ambulatory Visit
Admission: RE | Admit: 2018-05-12 | Discharge: 2018-05-12 | Disposition: A | Discharge status: Home / Self Care | Payer: Medicare Other | Source: Ambulatory Visit | Attending: Radiation Oncology | Admitting: Radiation Oncology

## 2018-05-12 DIAGNOSIS — Z510 Encounter for antineoplastic radiation therapy: Secondary | ICD-10-CM | POA: Diagnosis not present

## 2018-05-13 ENCOUNTER — Ambulatory Visit
Admission: RE | Admit: 2018-05-13 | Discharge: 2018-05-13 | Disposition: A | Discharge status: Home / Self Care | Payer: Medicare Other | Source: Ambulatory Visit | Attending: Radiation Oncology | Admitting: Radiation Oncology

## 2018-05-13 DIAGNOSIS — Z510 Encounter for antineoplastic radiation therapy: Secondary | ICD-10-CM | POA: Diagnosis not present

## 2018-06-14 ENCOUNTER — Ambulatory Visit: Payer: Medicare Other | Attending: Radiation Oncology | Admitting: Radiation Oncology

## 2019-09-29 ENCOUNTER — Ambulatory Visit: Payer: Medicare Other | Attending: Internal Medicine

## 2019-09-29 DIAGNOSIS — Z23 Encounter for immunization: Secondary | ICD-10-CM

## 2019-09-29 NOTE — Progress Notes (Signed)
   Covid-19 Vaccination Clinic  Name:  ETOLA MULL    MRN: [id number] DOB: [birth year]  09/29/2019  Ms. Gowens was observed post Covid-19 immunization for 15 minutes without incident. She was provided with Vaccine Information Sheet and instruction to access the V-Safe system.   Ms. Widmann was instructed to call 911 with any severe reactions post vaccine: Marland Kitchen Difficulty breathing  . Swelling of face and throat  . A fast heartbeat  . A bad rash all over body  . Dizziness and weakness   Immunizations Administered    Name Date Dose VIS Date Route   Pfizer COVID-19 Vaccine 09/29/2019  9:15 AM 0.3 mL 06/24/2019 Intramuscular   Manufacturer: [employer], [employer]   Lot: [id number]   NDC: [address]-3

## 2019-10-26 ENCOUNTER — Ambulatory Visit: Payer: Medicare Other | Attending: Internal Medicine

## 2019-10-26 DIAGNOSIS — Z23 Encounter for immunization: Secondary | ICD-10-CM

## 2019-10-26 NOTE — Progress Notes (Signed)
   Covid-19 Vaccination Clinic  Name:  [name]    MRN: [id number] DOB: 24-Jul-1960  10/26/2019  [name] was observed post Covid-19 immunization for 15 minutes without incident. She was provided with Vaccine Information Sheet and instruction to access the V-Safe system.   [name] was instructed to call 911 with any severe reactions post vaccine: Marland Kitchen Difficulty breathing  . Swelling of face and throat  . A fast heartbeat  . A bad rash all over body  . Dizziness and weakness   Immunizations Administered    Name Date Dose VIS Date Route   Pfizer COVID-19 Vaccine 10/26/2019  4:18 PM 0.3 mL 06/24/2019 Intramuscular   Manufacturer: [employer], [employer]   Lot: [id number]   NDC: [address]-2

## 2021-12-30 ENCOUNTER — Other Ambulatory Visit: Payer: Self-pay | Admitting: Family Medicine

## 2021-12-30 DIAGNOSIS — Z1231 Encounter for screening mammogram for malignant neoplasm of breast: Secondary | ICD-10-CM

## 2021-12-31 ENCOUNTER — Inpatient Hospital Stay
Admission: RE | Admit: 2021-12-31 | Discharge: 2021-12-31 | Disposition: A | Discharge status: Home / Self Care | Payer: Self-pay | Source: Ambulatory Visit | Attending: *Deleted | Admitting: *Deleted

## 2021-12-31 ENCOUNTER — Other Ambulatory Visit: Payer: Self-pay | Admitting: *Deleted

## 2021-12-31 DIAGNOSIS — Z1231 Encounter for screening mammogram for malignant neoplasm of breast: Secondary | ICD-10-CM

## 2022-02-14 ENCOUNTER — Ambulatory Visit (LOCAL_COMMUNITY_HEALTH_CENTER): Payer: Medicare Other

## 2022-02-14 DIAGNOSIS — Z719 Counseling, unspecified: Secondary | ICD-10-CM

## 2022-02-14 NOTE — Progress Notes (Signed)
In nurse clinic requesting Pfizer BV.  Per [facility] and patient's covid vaccine card, she has had Pfizer BV booster and does not meet eligibility per standing order for additional vaccine at this time.  Is not 61 years old and denies being immunocompromised.    Pt understood guidelines and said she would wait until eligible for another covid vaccine.   Cherlynn Polo, RN

## 2022-05-13 ENCOUNTER — Ambulatory Visit
Admission: RE | Admit: 2022-05-13 | Discharge: 2022-05-13 | Disposition: A | Discharge status: Home / Self Care | Payer: Medicare Other | Source: Ambulatory Visit | Attending: Family Medicine | Admitting: Family Medicine

## 2022-05-13 DIAGNOSIS — Z1231 Encounter for screening mammogram for malignant neoplasm of breast: Secondary | ICD-10-CM | POA: Insufficient documentation

## 2022-05-13 HISTORY — DX: Personal history of irradiation: Z92.3

## 2022-06-18 ENCOUNTER — Ambulatory Visit (LOCAL_COMMUNITY_HEALTH_CENTER): Payer: Medicare Other

## 2022-06-18 DIAGNOSIS — Z23 Encounter for immunization: Secondary | ICD-10-CM | POA: Diagnosis not present

## 2022-06-18 DIAGNOSIS — Z719 Counseling, unspecified: Secondary | ICD-10-CM

## 2022-06-18 NOTE — Progress Notes (Signed)
  Are you feeling sick today? No   Have you ever received a dose of COVID-19 Vaccine? [employer], [place], [place], [place], Other) Yes  If yes, which vaccine and how many doses?   Pfizer, 5   Did you bring the vaccination record card or other documentation?  Yes   Do you have a health condition or are undergoing treatment that makes you moderately or severely immunocompromised? This would include, but not be limited to: cancer, HIV, organ transplant, immunosuppressive therapy/high-dose corticosteroids, or moderate/severe primary immunodeficiency.  No  Have you received COVID-19 vaccine before or during hematopoietic cell transplant (HCT) or CAR-T-cell therapies? No  Have you ever had an allergic reaction to: (This would include a severe allergic reaction or a reaction that caused hives, swelling, or respiratory distress, including wheezing.) A component of a COVID-19 vaccine or a previous dose of COVID-19 vaccine? No   Have you ever had an allergic reaction to another vaccine (other thanCOVID-19 vaccine) or an injectable medication? (This would include a severe allergic reaction or a reaction that caused hives, swelling, or respiratory distress, including wheezing.)   No    Do you have a history of any of the following:  Myocarditis or Pericarditis No  Dermal fillers:  No  Multisystem Inflammatory Syndrome (MIS-C or MIS-A)? No  COVID-19 disease within the past 3 months? No  Vaccinated with monkeypox vaccine in the last 4 weeks? No  Eligible, administered Pfizer comirnaty [phone]. Monitored and tolerated well. Provided VIS and [facility], M.Philo Kurtz, LPN.

## 2023-05-15 DEATH — deceased
